# Patient Record
Sex: Male | Born: 1950 | Race: Black or African American | Hispanic: No | Marital: Married | Smoking: Never smoker
Health system: Southern US, Community
[De-identification: ages and names within clinical notes are randomized; demographics above are authoritative.]

## PROBLEM LIST (undated history)

## (undated) DIAGNOSIS — Z860100 Personal history of colon polyps, unspecified: Secondary | ICD-10-CM

## (undated) DIAGNOSIS — F32A Depression, unspecified: Secondary | ICD-10-CM

## (undated) DIAGNOSIS — N529 Male erectile dysfunction, unspecified: Secondary | ICD-10-CM

## (undated) DIAGNOSIS — F419 Anxiety disorder, unspecified: Secondary | ICD-10-CM

## (undated) DIAGNOSIS — K219 Gastro-esophageal reflux disease without esophagitis: Secondary | ICD-10-CM

## (undated) DIAGNOSIS — F329 Major depressive disorder, single episode, unspecified: Secondary | ICD-10-CM

## (undated) DIAGNOSIS — I10 Essential (primary) hypertension: Secondary | ICD-10-CM

## (undated) HISTORY — DX: Essential (primary) hypertension: I10

## (undated) HISTORY — DX: Personal history of colonic polyps: Z86.010

## (undated) HISTORY — DX: Depression, unspecified: F32.A

## (undated) HISTORY — DX: Major depressive disorder, single episode, unspecified: F32.9

## (undated) HISTORY — PX: TONSILECTOMY/ADENOIDECTOMY WITH MYRINGOTOMY: SHX6125

## (undated) HISTORY — DX: Anxiety disorder, unspecified: F41.9

## (undated) HISTORY — DX: Personal history of colon polyps, unspecified: Z86.0100

## (undated) HISTORY — DX: Male erectile dysfunction, unspecified: N52.9

---

## 1984-07-02 HISTORY — PX: VASECTOMY: SHX75

## 2005-08-21 ENCOUNTER — Encounter: Admission: RE | Admit: 2005-08-21 | Discharge: 2005-08-21 | Payer: Self-pay | Admitting: Internal Medicine

## 2008-04-03 ENCOUNTER — Emergency Department (HOSPITAL_COMMUNITY): Admission: EM | Admit: 2008-04-03 | Discharge: 2008-04-03 | Payer: Self-pay | Admitting: Emergency Medicine

## 2010-05-04 ENCOUNTER — Emergency Department (HOSPITAL_COMMUNITY): Admission: EM | Admit: 2010-05-04 | Discharge: 2010-05-04 | Payer: Self-pay | Admitting: Emergency Medicine

## 2013-11-26 ENCOUNTER — Encounter: Payer: Self-pay | Admitting: Internal Medicine

## 2014-03-19 ENCOUNTER — Encounter: Payer: Self-pay | Admitting: Internal Medicine

## 2016-10-27 LAB — HEPATIC FUNCTION PANEL
ALK PHOS: 56 (ref 25–125)
ALT: 13 (ref 10–40)
AST: 18 (ref 14–40)
BILIRUBIN, TOTAL: 0.8

## 2016-10-27 LAB — BASIC METABOLIC PANEL
BUN: 14 (ref 4–21)
Creatinine: 1.1 (ref 0.6–1.3)
GLUCOSE: 81
Potassium: 4.3 (ref 3.4–5.3)
SODIUM: 140 (ref 137–147)

## 2016-10-27 LAB — LIPID PANEL
Cholesterol: 155 (ref 0–200)
HDL: 61 (ref 35–70)
LDL CALC: 76
Triglycerides: 35 — AB (ref 40–160)

## 2016-10-27 LAB — CBC AND DIFFERENTIAL
HCT: 40 — AB (ref 41–53)
Hemoglobin: 13.6 (ref 13.5–17.5)
Neutrophils Absolute: 2
PLATELETS: 208 (ref 150–399)
WBC: 4.1

## 2016-10-27 LAB — TSH: TSH: 2.12 (ref 0.41–5.90)

## 2016-10-30 LAB — HM HEPATITIS C SCREENING LAB: HM HEPATITIS C SCREENING: NEGATIVE

## 2016-10-30 LAB — VITAMIN D 25 HYDROXY (VIT D DEFICIENCY, FRACTURES): VIT D 25 HYDROXY: 34

## 2016-10-30 LAB — PSA: PSA: 0.54

## 2017-12-16 ENCOUNTER — Ambulatory Visit (INDEPENDENT_AMBULATORY_CARE_PROVIDER_SITE_OTHER): Payer: BLUE CROSS/BLUE SHIELD | Admitting: Family Medicine

## 2017-12-16 ENCOUNTER — Encounter: Payer: Self-pay | Admitting: Family Medicine

## 2017-12-16 DIAGNOSIS — F39 Unspecified mood [affective] disorder: Secondary | ICD-10-CM | POA: Insufficient documentation

## 2017-12-16 DIAGNOSIS — I10 Essential (primary) hypertension: Secondary | ICD-10-CM | POA: Diagnosis not present

## 2017-12-16 MED ORDER — CITALOPRAM HYDROBROMIDE 20 MG PO TABS: 40.0000 mg | ORAL_TABLET | Freq: Every day | ORAL | 3 refills | Status: DC

## 2017-12-16 MED ORDER — TELMISARTAN 80 MG PO TABS
80.0000 mg | ORAL_TABLET | Freq: Every day | ORAL | 3 refills | Status: DC
Start: 2017-12-16 — End: 2018-09-01

## 2017-12-16 NOTE — Assessment & Plan Note (Signed)
At goal today on telmisartan 80 mg daily.  We will continue this.
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
## 2017-12-16 NOTE — Assessment & Plan Note (Signed)
Patient with elements of depression, anxiety and possibly PTSD.  His PHQ 9 score is 4 today.  His GAD score is 7.  We discussed treatment options.  We will start Celexa 20 mg daily for 1 to 2 weeks, then increase to 40 mg daily.  Discussed referral to behavioral medicine for counseling, patient will look into this.

## 2017-12-16 NOTE — Patient Instructions (Signed)
It was very nice to see you today!  I will refill your telmisartan today.  Your blood pressure looks great.  I think you have anxiety with depression.  You may also have PTSD.  Will start Celexa today.  Please take 1 pill daily for the next 1 to 2 weeks.  You can increase to 2 pills daily if you do well without side effects.  Please let me know if you be interested in seeing our behavioral specialist.  Come back to see me in 4 to 6 weeks, or sooner as needed.  Take care, Dr Jerline Pain

## 2018-01-01 ENCOUNTER — Encounter: Payer: Self-pay | Admitting: Family Medicine

## 2018-01-13 ENCOUNTER — Ambulatory Visit: Payer: BLUE CROSS/BLUE SHIELD | Admitting: Family Medicine

## 2018-08-12 ENCOUNTER — Telehealth: Payer: Self-pay | Admitting: Family Medicine

## 2018-08-12 NOTE — Telephone Encounter (Signed)
Need OK from both providers. Please advise.   Copied from [place] [phone]. Topic: Appointment Scheduling - Transfer of Care >> Aug 12, 2018 10:23 AM Reyne Dumas L wrote: Pt is requesting to transfer FROM: Dr. Jerline Pain Pt is requesting to transfer TO: Dr. Ethelene Hal Reason for requested transfer: office is closer to home  Send CRM to patient's current PCP (transferring FROM).  Pt is in need of a physical.  Pt can be reached at [phone].

## 2018-08-12 NOTE — Telephone Encounter (Signed)
Okay with me 

## 2018-08-12 NOTE — Telephone Encounter (Signed)
Okay to schedule patient for physical.

## 2018-08-12 NOTE — Telephone Encounter (Signed)
Ok with me 

## 2018-08-13 NOTE — Telephone Encounter (Signed)
I called and spoke to patient. Patient informed TOC was approved to see Dr. Ethelene Hal. Patient scheduled to see Dr. Ethelene Hal on 08/20/2018 @ 11am.

## 2018-08-20 ENCOUNTER — Encounter: Payer: BLUE CROSS/BLUE SHIELD | Admitting: Family Medicine

## 2018-09-01 ENCOUNTER — Encounter: Payer: Self-pay | Admitting: Family Medicine

## 2018-09-01 ENCOUNTER — Ambulatory Visit (INDEPENDENT_AMBULATORY_CARE_PROVIDER_SITE_OTHER): Payer: BLUE CROSS/BLUE SHIELD | Admitting: Family Medicine

## 2018-09-01 VITALS — BP 126/80 | HR 72 | Temp 97.3°F | Ht 73.0 in | Wt 168.0 lb

## 2018-09-01 DIAGNOSIS — Z0000 Encounter for general adult medical examination without abnormal findings: Secondary | ICD-10-CM | POA: Insufficient documentation

## 2018-09-01 DIAGNOSIS — I10 Essential (primary) hypertension: Secondary | ICD-10-CM

## 2018-09-01 DIAGNOSIS — F39 Unspecified mood [affective] disorder: Secondary | ICD-10-CM

## 2018-09-01 DIAGNOSIS — L720 Epidermal cyst: Secondary | ICD-10-CM

## 2018-09-01 DIAGNOSIS — Z125 Encounter for screening for malignant neoplasm of prostate: Secondary | ICD-10-CM | POA: Insufficient documentation

## 2018-09-01 DIAGNOSIS — Z0001 Encounter for general adult medical examination with abnormal findings: Secondary | ICD-10-CM | POA: Diagnosis not present

## 2018-09-01 LAB — COMPREHENSIVE METABOLIC PANEL
ALBUMIN: 4.2 g/dL (ref 3.5–5.2)
ALK PHOS: 65 U/L (ref 39–117)
ALT: 12 U/L (ref 0–53)
AST: 14 U/L (ref 0–37)
BUN: 12 mg/dL (ref 6–23)
CO2: 28 mEq/L (ref 19–32)
Calcium: 9.3 mg/dL (ref 8.4–10.5)
Chloride: 106 mEq/L (ref 96–112)
Creatinine, Ser: 1.1 mg/dL (ref 0.40–1.50)
GFR: 80.45 mL/min (ref >60.00)
GLUCOSE: 86 mg/dL (ref 70–99)
POTASSIUM: 4.2 meq/L (ref 3.5–5.1)
Sodium: 141 mEq/L (ref 135–145)
Total Bilirubin: 0.8 mg/dL (ref 0.2–1.2)
Total Protein: 7 g/dL (ref 6.0–8.3)

## 2018-09-01 LAB — URINALYSIS, ROUTINE W REFLEX MICROSCOPIC
Bilirubin Urine: NEGATIVE
Hgb urine dipstick: NEGATIVE
Ketones, ur: NEGATIVE
LEUKOCYTE UA: NEGATIVE
NITRITE: NEGATIVE
PH: 7 (ref 5.0–8.0)
SPECIFIC GRAVITY, URINE: 1.015 (ref 1.000–1.030)
Total Protein, Urine: NEGATIVE
Urine Glucose: NEGATIVE
Urobilinogen, UA: 0.2 (ref 0.0–1.0)

## 2018-09-01 LAB — LIPID PANEL
CHOLESTEROL: 125 mg/dL (ref 0–200)
HDL: 52.2 mg/dL (ref >39.00)
LDL Cholesterol: 63 mg/dL (ref 0–99)
NonHDL: 72.94
TRIGLYCERIDES: 51 mg/dL (ref 0.0–149.0)
Total CHOL/HDL Ratio: 2
VLDL: 10.2 mg/dL (ref 0.0–40.0)

## 2018-09-01 LAB — CBC
HCT: 38.6 % — ABNORMAL LOW (ref 39.0–52.0)
Hemoglobin: 13 g/dL (ref 13.0–17.0)
MCHC: 33.6 g/dL (ref 30.0–36.0)
MCV: 87.5 fl (ref 78.0–100.0)
Platelets: 224 10*3/uL (ref 150.0–400.0)
RBC: 4.41 Mil/uL (ref 4.22–5.81)
RDW: 14.1 % (ref 11.5–15.5)
WBC: 4.6 10*3/uL (ref 4.0–10.5)

## 2018-09-01 LAB — MICROALBUMIN / CREATININE URINE RATIO
CREATININE, U: 95.6 mg/dL
MICROALB/CREAT RATIO: 0.8 mg/g (ref 0.0–30.0)
Microalb, Ur: 0.7 mg/dL (ref 0.0–1.9)

## 2018-09-01 LAB — PSA: PSA: 0.49 ng/mL (ref 0.10–4.00)

## 2018-09-01 MED ORDER — TELMISARTAN 80 MG PO TABS: 80.0000 mg | ORAL_TABLET | Freq: Every day | ORAL | 3 refills | Status: DC

## 2018-09-01 MED ORDER — CITALOPRAM HYDROBROMIDE 20 MG PO TABS: 40.0000 mg | ORAL_TABLET | Freq: Every day | ORAL | 3 refills | Status: DC

## 2018-09-01 NOTE — Patient Instructions (Signed)
DASH Eating Plan DASH stands for "Dietary Approaches to Stop Hypertension." The DASH eating plan is a healthy eating plan that has been shown to reduce high blood pressure (hypertension). It may also reduce your risk for type 2 diabetes, heart disease, and stroke. The DASH eating plan may also help with weight loss. What are tips for following this plan?  General guidelines  Avoid eating more than 2,300 mg (milligrams) of salt (sodium) a day. If you have hypertension, you may need to reduce your sodium intake to 1,500 mg a day.  Limit alcohol intake to no more than 1 drink a day for nonpregnant women and 2 drinks a day for men. One drink equals 12 oz of beer, 5 oz of wine, or 1 oz of hard liquor.  Work with your health care provider to maintain a healthy body weight or to lose weight. Ask what an ideal weight is for you.  Get at least 30 minutes of exercise that causes your heart to beat faster (aerobic exercise) most days of the week. Activities may include walking, swimming, or biking.  Work with your health care provider or diet and nutrition specialist (dietitian) to adjust your eating plan to your individual calorie needs. Reading food labels   Check food labels for the amount of sodium per serving. Choose foods with less than 5 percent of the Daily Value of sodium. Generally, foods with less than 300 mg of sodium per serving fit into this eating plan.  To find whole grains, look for the word "whole" as the first word in the ingredient list. Shopping  Buy products labeled as "low-sodium" or "no salt added."  Buy fresh foods. Avoid canned foods and premade or frozen meals. Cooking  Avoid adding salt when cooking. Use salt-free seasonings or herbs instead of table salt or sea salt. Check with your health care provider or pharmacist before using salt substitutes.  Do not fry foods. Cook foods using healthy methods such as baking, boiling, grilling, and broiling instead.  Cook with  heart-healthy oils, such as olive, canola, soybean, or sunflower oil. Meal planning  Eat a balanced diet that includes: ? 5 or more servings of fruits and vegetables each day. At each meal, try to fill half of your plate with fruits and vegetables. ? Up to 6-8 servings of whole grains each day. ? Less than 6 oz of lean meat, poultry, or fish each day. A 3-oz serving of meat is about the same size as a deck of cards. One egg equals 1 oz. ? 2 servings of low-fat dairy each day. ? A serving of nuts, seeds, or beans 5 times each week. ? Heart-healthy fats. Healthy fats called Omega-3 fatty acids are found in foods such as flaxseeds and coldwater fish, like sardines, salmon, and mackerel.  Limit how much you eat of the following: ? Canned or prepackaged foods. ? Food that is high in trans fat, such as fried foods. ? Food that is high in saturated fat, such as fatty meat. ? Sweets, desserts, sugary drinks, and other foods with added sugar. ? Full-fat dairy products.  Do not salt foods before eating.  Try to eat at least 2 vegetarian meals each week.  Eat more home-cooked food and less restaurant, buffet, and fast food.  When eating at a restaurant, ask that your food be prepared with less salt or no salt, if possible. What foods are recommended? The items listed may not be a complete list. Talk with your dietitian about   what dietary choices are best for you. Grains Whole-grain or whole-wheat bread. Whole-grain or whole-wheat pasta. Brown rice. Oatmeal. Quinoa. Bulgur. Whole-grain and low-sodium cereals. Pita bread. Low-fat, low-sodium crackers. Whole-wheat flour tortillas. Vegetables Fresh or frozen vegetables (raw, steamed, roasted, or grilled). Low-sodium or reduced-sodium tomato and vegetable juice. Low-sodium or reduced-sodium tomato sauce and tomato paste. Low-sodium or reduced-sodium canned vegetables. Fruits All fresh, dried, or frozen fruit. Canned fruit in natural juice (without  added sugar). Meat and other protein foods Skinless chicken or turkey. Ground chicken or turkey. Pork with fat trimmed off. Fish and seafood. Egg whites. Dried beans, peas, or lentils. Unsalted nuts, nut butters, and seeds. Unsalted canned beans. Lean cuts of beef with fat trimmed off. Low-sodium, lean deli meat. Dairy Low-fat (1%) or fat-free (skim) milk. Fat-free, low-fat, or reduced-fat cheeses. Nonfat, low-sodium ricotta or cottage cheese. Low-fat or nonfat yogurt. Low-fat, low-sodium cheese. Fats and oils Soft margarine without trans fats. Vegetable oil. Low-fat, reduced-fat, or light mayonnaise and salad dressings (reduced-sodium). Canola, safflower, olive, soybean, and sunflower oils. Avocado. Seasoning and other foods Herbs. Spices. Seasoning mixes without salt. Unsalted popcorn and pretzels. Fat-free sweets. What foods are not recommended? The items listed may not be a complete list. Talk with your dietitian about what dietary choices are best for you. Grains Baked goods made with fat, such as croissants, muffins, or some breads. Dry pasta or rice meal packs. Vegetables Creamed or fried vegetables. Vegetables in a cheese sauce. Regular canned vegetables (not low-sodium or reduced-sodium). Regular canned tomato sauce and paste (not low-sodium or reduced-sodium). Regular tomato and vegetable juice (not low-sodium or reduced-sodium). Pickles. Olives. Fruits Canned fruit in a light or heavy syrup. Fried fruit. Fruit in cream or butter sauce. Meat and other protein foods Fatty cuts of meat. Ribs. Fried meat. Bacon. Sausage. Bologna and other processed lunch meats. Salami. Fatback. Hotdogs. Bratwurst. Salted nuts and seeds. Canned beans with added salt. Canned or smoked fish. Whole eggs or egg yolks. Chicken or turkey with skin. Dairy Whole or 2% milk, cream, and half-and-half. Whole or full-fat cream cheese. Whole-fat or sweetened yogurt. Full-fat cheese. Nondairy creamers. Whipped toppings.  Processed cheese and cheese spreads. Fats and oils Butter. Stick margarine. Lard. Shortening. Ghee. Bacon fat. Tropical oils, such as coconut, palm kernel, or palm oil. Seasoning and other foods Salted popcorn and pretzels. Onion salt, garlic salt, seasoned salt, table salt, and sea salt. Worcestershire sauce. Tartar sauce. Barbecue sauce. Teriyaki sauce. Soy sauce, including reduced-sodium. Steak sauce. Canned and packaged gravies. Fish sauce. Oyster sauce. Cocktail sauce. Horseradish that you find on the shelf. Ketchup. Mustard. Meat flavorings and tenderizers. Bouillon cubes. Hot sauce and Tabasco sauce. Premade or packaged marinades. Premade or packaged taco seasonings. Relishes. Regular salad dressings. Where to find more information:  National Heart, Lung, and Blood Institute: www.nhlbi.nih.gov  American Heart Association: www.heart.org Summary  The DASH eating plan is a healthy eating plan that has been shown to reduce high blood pressure (hypertension). It may also reduce your risk for type 2 diabetes, heart disease, and stroke.  With the DASH eating plan, you should limit salt (sodium) intake to 2,300 mg a day. If you have hypertension, you may need to reduce your sodium intake to 1,500 mg a day.  When on the DASH eating plan, aim to eat more fresh fruits and vegetables, whole grains, lean proteins, low-fat dairy, and heart-healthy fats.  Work with your health care provider or diet and nutrition specialist (dietitian) to adjust your eating plan to your   individual calorie needs. This information is not intended to replace advice given to you by your health care provider. Make sure you discuss any questions you have with your health care provider. Document Released: 06/07/2011 Document Revised: 06/11/2016 Document Reviewed: 06/11/2016 Elsevier Interactive Patient Education  2019 [place] Maintenance After Age 56 After age 58, you are at a higher risk for certain long-term  diseases and infections as well as injuries from falls. Falls are a major cause of broken bones and head injuries in people who are older than age 21. Getting regular preventive care can help to keep you healthy and well. Preventive care includes getting regular testing and making lifestyle changes as recommended by your health care provider. Talk with your health care provider about:  Which screenings and tests you should have. A screening is a test that checks for a disease when you have no symptoms.  A diet and exercise plan that is right for you. What should I know about screenings and tests to prevent falls? Screening and testing are the best ways to find a health problem early. Early diagnosis and treatment give you the best chance of managing medical conditions that are common after age 75. Certain conditions and lifestyle choices may make you more likely to have a fall. Your health care provider may recommend:  Regular vision checks. Poor vision and conditions such as cataracts can make you more likely to have a fall. If you wear glasses, make sure to get your prescription updated if your vision changes.  Medicine review. Work with your health care provider to regularly review all of the medicines you are taking, including over-the-counter medicines. Ask your health care provider about any side effects that may make you more likely to have a fall. Tell your health care provider if any medicines that you take make you feel dizzy or sleepy.  Osteoporosis screening. Osteoporosis is a condition that causes the bones to get weaker. This can make the bones weak and cause them to break more easily.  Blood pressure screening. Blood pressure changes and medicines to control blood pressure can make you feel dizzy.  Strength and balance checks. Your health care provider may recommend certain tests to check your strength and balance while standing, walking, or changing positions.  Foot health exam.  Foot pain and numbness, as well as not wearing proper footwear, can make you more likely to have a fall.  Depression screening. You may be more likely to have a fall if you have a fear of falling, feel emotionally low, or feel unable to do activities that you used to do.  Alcohol use screening. Using too much alcohol can affect your balance and may make you more likely to have a fall. What actions can I take to lower my risk of falls? General instructions  Talk with your health care provider about your risks for falling. Tell your health care provider if: ? You fall. Be sure to tell your health care provider about all falls, even ones that seem minor. ? You feel dizzy, sleepy, or off-balance.  Take over-the-counter and prescription medicines only as told by your health care provider. These include any supplements.  Eat a healthy diet and maintain a healthy weight. A healthy diet includes low-fat dairy products, low-fat (lean) meats, and fiber from whole grains, beans, and lots of fruits and vegetables. Home safety  Remove any tripping hazards, such as rugs, cords, and clutter.  Install safety equipment such as grab bars  in bathrooms and safety rails on stairs.  Keep rooms and walkways well-lit. Activity   Follow a regular exercise program to stay fit. This will help you maintain your balance. Ask your health care provider what types of exercise are appropriate for you.  If you need a cane or walker, use it as recommended by your health care provider.  Wear supportive shoes that have nonskid soles. Lifestyle  Do not drink alcohol if your health care provider tells you not to drink.  If you drink alcohol, limit how much you have: ? 0-1 drink a day for women. ? 0-2 drinks a day for men.  Be aware of how much alcohol is in your drink. In the [place] one drink equals one typical bottle of beer (12 oz), one-half glass of wine (5 oz), or one shot of hard liquor (1 oz).  Do not use any  products that contain nicotine or tobacco, such as cigarettes and e-cigarettes. If you need help quitting, ask your health care provider. Summary  Having a healthy lifestyle and getting preventive care can help to protect your health and wellness after age 70.  Screening and testing are the best way to find a health problem early and help you avoid having a fall. Early diagnosis and treatment give you the best chance for managing medical conditions that are more common for people who are older than age 40.  Falls are a major cause of broken bones and head injuries in people who are older than age 59. Take precautions to prevent a fall at home.  Work with your health care provider to learn what changes you can make to improve your health and wellness and to prevent falls. This information is not intended to replace advice given to you by your health care provider. Make sure you discuss any questions you have with your health care provider. Document Released: 05/01/2017 Document Revised: 05/01/2017 Document Reviewed: 05/01/2017 Elsevier Interactive Patient Education  2019 [place] DASH stands for "Dietary Approaches to Stop Hypertension." The DASH eating plan is a healthy eating plan that has been shown to reduce high blood pressure (hypertension). It may also reduce your risk for type 2 diabetes, heart disease, and stroke. The DASH eating plan may also help with weight loss. What are tips for following this plan?  General guidelines  Avoid eating more than 2,300 mg (milligrams) of salt (sodium) a day. If you have hypertension, you may need to reduce your sodium intake to 1,500 mg a day.  Limit alcohol intake to no more than 1 drink a day for nonpregnant women and 2 drinks a day for men. One drink equals 12 oz of beer, 5 oz of wine, or 1 oz of hard liquor.  Work with your health care provider to maintain a healthy body weight or to lose weight. Ask what an ideal weight  is for you.  Get at least 30 minutes of exercise that causes your heart to beat faster (aerobic exercise) most days of the week. Activities may include walking, swimming, or biking.  Work with your health care provider or diet and nutrition specialist (dietitian) to adjust your eating plan to your individual calorie needs. Reading food labels   Check food labels for the amount of sodium per serving. Choose foods with less than 5 percent of the Daily Value of sodium. Generally, foods with less than 300 mg of sodium per serving fit into this eating plan.  To find whole grains,  look for the word "whole" as the first word in the ingredient list. Shopping  Buy products labeled as "low-sodium" or "no salt added."  Buy fresh foods. Avoid canned foods and premade or frozen meals. Cooking  Avoid adding salt when cooking. Use salt-free seasonings or herbs instead of table salt or sea salt. Check with your health care provider or pharmacist before using salt substitutes.  Do not fry foods. Cook foods using healthy methods such as baking, boiling, grilling, and broiling instead.  Cook with heart-healthy oils, such as olive, canola, soybean, or sunflower oil. Meal planning  Eat a balanced diet that includes: ? 5 or more servings of fruits and vegetables each day. At each meal, try to fill half of your plate with fruits and vegetables. ? Up to 6-8 servings of whole grains each day. ? Less than 6 oz of lean meat, poultry, or fish each day. A 3-oz serving of meat is about the same size as a deck of cards. One egg equals 1 oz. ? 2 servings of low-fat dairy each day. ? A serving of nuts, seeds, or beans 5 times each week. ? Heart-healthy fats. Healthy fats called Omega-3 fatty acids are found in foods such as flaxseeds and coldwater fish, like sardines, salmon, and mackerel.  Limit how much you eat of the following: ? Canned or prepackaged foods. ? Food that is high in trans fat, such as fried  foods. ? Food that is high in saturated fat, such as fatty meat. ? Sweets, desserts, sugary drinks, and other foods with added sugar. ? Full-fat dairy products.  Do not salt foods before eating.  Try to eat at least 2 vegetarian meals each week.  Eat more home-cooked food and less restaurant, buffet, and fast food.  When eating at a restaurant, ask that your food be prepared with less salt or no salt, if possible. What foods are recommended? The items listed may not be a complete list. Talk with your dietitian about what dietary choices are best for you. Grains Whole-grain or whole-wheat bread. Whole-grain or whole-wheat pasta. Brown rice. [place]. Bulgur. Whole-grain and low-sodium cereals. Pita bread. Low-fat, low-sodium crackers. Whole-wheat flour tortillas. Vegetables Fresh or frozen vegetables (raw, steamed, roasted, or grilled). Low-sodium or reduced-sodium tomato and vegetable juice. Low-sodium or reduced-sodium tomato sauce and tomato paste. Low-sodium or reduced-sodium canned vegetables. Fruits All fresh, dried, or frozen fruit. Canned fruit in natural juice (without added sugar). Meat and other protein foods Skinless chicken or [place]. Ground chicken or [place]. Pork with fat trimmed off. Fish and seafood. Egg whites. Dried beans, peas, or lentils. Unsalted nuts, nut butters, and seeds. Unsalted canned beans. Lean cuts of beef with fat trimmed off. Low-sodium, lean deli meat. Dairy Low-fat (1%) or fat-free (skim) milk. Fat-free, low-fat, or reduced-fat cheeses. Nonfat, low-sodium ricotta or cottage cheese. Low-fat or nonfat yogurt. Low-fat, low-sodium cheese. Fats and oils Soft margarine without trans fats. Vegetable oil. Low-fat, reduced-fat, or light mayonnaise and salad dressings (reduced-sodium). Canola, safflower, olive, soybean, and sunflower oils. Avocado. Seasoning and other foods Herbs. Spices. Seasoning mixes without salt. Unsalted popcorn and pretzels. Fat-free  sweets. What foods are not recommended? The items listed may not be a complete list. Talk with your dietitian about what dietary choices are best for you. Grains Baked goods made with fat, such as croissants, muffins, or some breads. Dry pasta or rice meal packs. Vegetables Creamed or fried vegetables. Vegetables in a cheese sauce. Regular canned vegetables (not low-sodium or reduced-sodium). Regular  canned tomato sauce and paste (not low-sodium or reduced-sodium). Regular tomato and vegetable juice (not low-sodium or reduced-sodium). Angie Fava. Olives. Fruits Canned fruit in a light or heavy syrup. Fried fruit. Fruit in cream or butter sauce. Meat and other protein foods Fatty cuts of meat. Ribs. Fried meat. Berniece Salines. Sausage. Bologna and other processed lunch meats. Salami. Fatback. Hotdogs. Bratwurst. Salted nuts and seeds. Canned beans with added salt. Canned or smoked fish. Whole eggs or egg yolks. Chicken or [place] with skin. Dairy Whole or 2% milk, cream, and half-and-half. Whole or full-fat cream cheese. Whole-fat or sweetened yogurt. Full-fat cheese. Nondairy creamers. Whipped toppings. Processed cheese and cheese spreads. Fats and oils Butter. Stick margarine. Lard. Shortening. Ghee. Bacon fat. Tropical oils, such as coconut, palm kernel, or palm oil. Seasoning and other foods Salted popcorn and pretzels. Onion salt, garlic salt, seasoned salt, table salt, and sea salt. Worcestershire sauce. Tartar sauce. Barbecue sauce. Teriyaki sauce. Soy sauce, including reduced-sodium. Steak sauce. Canned and packaged gravies. Fish sauce. Oyster sauce. Cocktail sauce. Horseradish that you find on the shelf. Ketchup. Mustard. Meat flavorings and tenderizers. Bouillon cubes. Hot sauce and Tabasco sauce. Premade or packaged marinades. Premade or packaged taco seasonings. Relishes. Regular salad dressings. Where to find more information:  National Heart, Lung, and [place]:  https://wilson-eaton.com/  American Heart Association: www.heart.org Summary  The DASH eating plan is a healthy eating plan that has been shown to reduce high blood pressure (hypertension). It may also reduce your risk for type 2 diabetes, heart disease, and stroke.  With the DASH eating plan, you should limit salt (sodium) intake to 2,300 mg a day. If you have hypertension, you may need to reduce your sodium intake to 1,500 mg a day.  When on the DASH eating plan, aim to eat more fresh fruits and vegetables, whole grains, lean proteins, low-fat dairy, and heart-healthy fats.  Work with your health care provider or diet and nutrition specialist (dietitian) to adjust your eating plan to your individual calorie needs. This information is not intended to replace advice given to you by your health care provider. Make sure you discuss any questions you have with your health care provider. Document Released: 06/07/2011 Document Revised: 06/11/2016 Document Reviewed: 06/11/2016 Elsevier Interactive Patient Education  2019 [employer].

## 2018-09-01 NOTE — Progress Notes (Signed)
Established Patient Office Visit  Subjective:  Patient ID: Phillip Wall, male    DOB: 01-28-1951  Age: 68 y.o. MRN: [id number]  CC:  Chief Complaint  Patient presents with  . Establish Care  . Annual Exam    HPI Phillip Wall presents for establishment of care with a physical exam and follow-up of his hypertension and mood disorder.  Blood pressure is been well controlled on the Micardis and he denies any problems taking this medication.  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  Denies any issues taking medicine it is worked well for him for some time now.  He is fasting today.  He is retired.  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  He gets up to 13,000 steps daily.  He does not smoke drink alcohol or use illicit drugs.  He lives with his wife of 93 years.  His oldest daughter is 49 and is dealing with breast cancer rheumatoid arthritis.  Past Medical History:  Diagnosis Date  . Anxiety   . Depression   . ED (erectile dysfunction)   . History of colonic polyps   . Hypertension     Past Surgical History:  Procedure Laterality Date  . TONSILECTOMY/ADENOIDECTOMY WITH MYRINGOTOMY    . VASECTOMY  1986    Family History  Problem Relation Age of Onset  . Bladder Cancer Mother   . Heart disease Father   . Heart attack Father   . Breast cancer Paternal Grandmother   . Breast cancer Daughter   . Alcohol abuse Sister   . Hypertension Sister   . Cancer Sister   . Drug abuse Sister     Social History   Socioeconomic History  . Marital status: Married    Spouse name: Not on file  . Number of children: 3  . Years of education: Not on file  . Highest education level: Not on file  Occupational History  . Not on file  Social Needs  . Financial resource strain: Not on file  . Food insecurity:    Worry: Not on file    Inability: Not on file  .  Transportation needs:    Medical: Not on file    Non-medical: Not on file  Tobacco Use  . Smoking status: Never Smoker  . Smokeless tobacco: Never Used  Substance and Sexual Activity  . Alcohol use: Not Currently  . Drug use: Never  . Sexual activity: Yes  Lifestyle  . Physical activity:    Days per week: Not on file    Minutes per session: Not on file  . Stress: Not on file  Relationships  . Social connections:    Talks on phone: Not on file    Gets together: Not on file    Attends religious service: Not on file    Active member of club or organization: Not on file    Attends meetings of clubs or organizations: Not on file    Relationship status: Not on file  . Intimate partner violence:    Fear of current or ex partner: Not on file    Emotionally abused: Not on file    Physically abused: Not on file    Forced sexual activity: Not on file  Other Topics Concern  . Not on file  Social History Narrative  . Not on file  Outpatient Medications Prior to Visit  Medication Sig Dispense Refill  . citalopram (CELEXA) 20 MG tablet Take 2 tablets (40 mg total) by mouth daily. 60 tablet 3  . telmisartan (MICARDIS) 80 MG tablet Take 1 tablet (80 mg total) by mouth daily. 90 tablet 3   No facility-administered medications prior to visit.     No Known Allergies  ROS Review of Systems  Constitutional: Negative.   HENT: Negative.   Eyes: Negative for photophobia and visual disturbance.  Respiratory: Negative.   Cardiovascular: Negative.   Gastrointestinal: Negative.   Endocrine: Negative for polyphagia and polyuria.  Musculoskeletal: Negative.   Allergic/Immunologic: Negative for immunocompromised state.  Neurological: Negative for weakness and headaches.  Hematological: Does not bruise/bleed easily.  Psychiatric/Behavioral: Negative.       Objective:    Physical Exam  Constitutional: He is oriented to person, place, and time. He appears well-developed and  well-nourished. No distress.  HENT:  Head: Normocephalic and atraumatic.  Right Ear: External ear normal.  Left Ear: External ear normal.  Mouth/Throat: Oropharynx is clear and moist. No oropharyngeal exudate.  Eyes: Pupils are equal, round, and reactive to light. Conjunctivae are normal. Right eye exhibits no discharge. Left eye exhibits no discharge. No scleral icterus.  Neck: Neck supple. No JVD present. No tracheal deviation present. No thyromegaly present.  Cardiovascular: Normal rate, regular rhythm and normal heart sounds.  Pulmonary/Chest: Effort normal and breath sounds normal. No stridor.  Abdominal: Soft. Bowel sounds are normal. He exhibits no distension. There is no abdominal tenderness. There is no rebound and no guarding. Hernia confirmed negative in the right inguinal area and confirmed negative in the left inguinal area.  Genitourinary:    Penis normal.  Rectum:     Guaiac result negative.     No rectal mass, anal fissure, tenderness, external hemorrhoid, internal hemorrhoid or abnormal anal tone.  Prostate is not enlarged and not tender. Right testis shows no mass, no swelling and no tenderness. Right testis is descended. Left testis shows no mass, no swelling and no tenderness. Left testis is descended. No hypospadias, penile erythema or penile tenderness. No discharge found.  Musculoskeletal:        General: No edema.  Lymphadenopathy:    He has no cervical adenopathy.       Right: No inguinal adenopathy present.  Neurological: He is alert and oriented to person, place, and time.  Skin: Skin is warm and dry. He is not diaphoretic.       BP 126/80   Pulse 72   Temp (!) 97.3 F (36.3 C) (Oral)   Ht 6\' 1"  (1.854 m)   Wt 168 lb (76.2 kg)   SpO2 97%   BMI 22.16 kg/m  Wt Readings from Last 3 Encounters:  09/01/18 168 lb (76.2 kg)  12/16/17 163 lb (73.9 kg)   BP Readings from Last 3 Encounters:  09/01/18 126/80  12/16/17 134/84   Guideline developer:  UpToDate  (see UpToDate for funding source) Date Released: June 2014  Health Maintenance Due  Topic Date Due  . PNA vac Low Risk Adult (1 of 2 - PCV13) 03/31/2015    There are no preventive care reminders to display for this patient.  Lab Results  Component Value Date   TSH 2.12 10/27/2016   Lab Results  Component Value Date   WBC 4.1 10/27/2016   HGB 13.6 10/27/2016   HCT 40 (A) 10/27/2016   PLT 208 10/27/2016   Lab Results  Component Value Date  NA 140 10/27/2016   K 4.3 10/27/2016   BUN 14 10/27/2016   CREATININE 1.1 10/27/2016   ALKPHOS 56 10/27/2016   AST 18 10/27/2016   ALT 13 10/27/2016   Lab Results  Component Value Date   CHOL 155 10/27/2016   Lab Results  Component Value Date   HDL 61 10/27/2016   Lab Results  Component Value Date   LDLCALC 76 10/27/2016   Lab Results  Component Value Date   TRIG 35 (A) 10/27/2016   No results found for: CHOLHDL No results found for: HGBA1C    Assessment & Plan:   Problem List Items Addressed This Visit      Cardiovascular and Mediastinum   Hypertension - Primary   Relevant Medications   telmisartan (MICARDIS) 80 MG tablet   Other Relevant Orders   CBC   Comprehensive metabolic panel   Urinalysis, Routine w reflex microscopic   Microalbumin / creatinine urine ratio     Other   Mood disorder (HCC)   Relevant Medications   citalopram (CELEXA) 20 MG tablet   Inclusion cyst   Relevant Orders   Ambulatory referral to Plastic Surgery   Encounter for health maintenance examination with abnormal findings   Relevant Orders   Lipid panel   PSA      Meds ordered this encounter  Medications  . citalopram (CELEXA) 20 MG tablet    Sig: Take 2 tablets (40 mg total) by mouth daily.    Dispense:  60 tablet    Refill:  3  . telmisartan (MICARDIS) 80 MG tablet    Sig: Take 1 tablet (80 mg total) by mouth daily.    Dispense:  90 tablet    Refill:  3    Follow-up: Return in about 6 months (around 03/04/2019).    Patient was given information on the DASH diet and health maintenance.  Follow-up will be in 6 months.

## 2018-12-17 ENCOUNTER — Other Ambulatory Visit: Payer: Self-pay | Admitting: Family Medicine

## 2018-12-17 DIAGNOSIS — I10 Essential (primary) hypertension: Secondary | ICD-10-CM

## 2019-01-02 ENCOUNTER — Other Ambulatory Visit: Payer: Self-pay | Admitting: Family Medicine

## 2019-01-02 DIAGNOSIS — I10 Essential (primary) hypertension: Secondary | ICD-10-CM

## 2019-01-02 NOTE — Telephone Encounter (Signed)
Pt saw dr Ethelene Hal in march 2020 and a new pt and need new rx from dr Ethelene Hal telmisartan 80 mg #90 w/[facility]

## 2019-01-03 MED ORDER — TELMISARTAN 80 MG PO TABS: 80.0000 mg | ORAL_TABLET | Freq: Every day | ORAL | 3 refills | Status: DC

## 2019-01-03 NOTE — Telephone Encounter (Signed)
Rx sent in

## 2019-01-06 ENCOUNTER — Telehealth: Payer: Self-pay | Admitting: Family Medicine

## 2019-01-06 NOTE — Telephone Encounter (Signed)
Called on behalf of Dr Ethelene Hal, based on last appt Dr Ethelene Hal wanted pt to return in 6 months for a follow up which be around 03/04/19 and I wanted to see if he wanted to go ahead and schedule. Left message

## 2019-03-05 ENCOUNTER — Ambulatory Visit: Payer: BLUE CROSS/BLUE SHIELD | Admitting: Family Medicine

## 2019-03-12 ENCOUNTER — Other Ambulatory Visit: Payer: Self-pay

## 2019-03-12 ENCOUNTER — Encounter: Payer: Self-pay | Admitting: Family Medicine

## 2019-03-12 ENCOUNTER — Ambulatory Visit (INDEPENDENT_AMBULATORY_CARE_PROVIDER_SITE_OTHER): Payer: Medicare Other | Admitting: Family Medicine

## 2019-03-12 VITALS — BP 120/70 | HR 64 | Ht 73.0 in | Wt 165.0 lb

## 2019-03-12 DIAGNOSIS — N5201 Erectile dysfunction due to arterial insufficiency: Secondary | ICD-10-CM

## 2019-03-12 DIAGNOSIS — Z23 Encounter for immunization: Secondary | ICD-10-CM | POA: Diagnosis not present

## 2019-03-12 DIAGNOSIS — K219 Gastro-esophageal reflux disease without esophagitis: Secondary | ICD-10-CM | POA: Diagnosis not present

## 2019-03-12 DIAGNOSIS — I10 Essential (primary) hypertension: Secondary | ICD-10-CM

## 2019-03-12 DIAGNOSIS — F39 Unspecified mood [affective] disorder: Secondary | ICD-10-CM | POA: Diagnosis not present

## 2019-03-12 LAB — BASIC METABOLIC PANEL
BUN: 15 mg/dL (ref 6–23)
CO2: 29 mEq/L (ref 19–32)
Calcium: 9.2 mg/dL (ref 8.4–10.5)
Chloride: 105 mEq/L (ref 96–112)
Creatinine, Ser: 1.16 mg/dL (ref 0.40–1.50)
GFR: 75.55 mL/min (ref >60.00)
Glucose, Bld: 89 mg/dL (ref 70–99)
Potassium: 4 mEq/L (ref 3.5–5.1)
Sodium: 139 mEq/L (ref 135–145)

## 2019-03-12 MED ORDER — CITALOPRAM HYDROBROMIDE 20 MG PO TABS: 40.0000 mg | ORAL_TABLET | Freq: Every day | ORAL | 2 refills | Status: DC

## 2019-03-12 MED ORDER — FAMOTIDINE 40 MG PO TABS: ORAL_TABLET | ORAL | 1 refills | Status: DC

## 2019-03-12 MED ORDER — SILDENAFIL CITRATE 20 MG PO TABS: ORAL_TABLET | ORAL | 1 refills | Status: DC

## 2019-03-12 MED ORDER — TELMISARTAN 80 MG PO TABS: 80.0000 mg | ORAL_TABLET | Freq: Every day | ORAL | 3 refills | Status: DC

## 2019-03-12 NOTE — Progress Notes (Addendum)
Established Patient Office Visit  Subjective:  Patient ID: Phillip Wall, male    DOB: 09-21-1950  Age: 68 y.o. MRN: [id number]  CC:  Chief Complaint  Patient presents with  . Follow-up    HPI Phillip Wall presents for follow-up of his hypertension anxiety and depression.  Continues to take Micardis and Celexa for control of these issues.  Fortunately they both continue to work well for him.  With these COVID times he admits to some anxiety but still believes it is well controlled.  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  He has been having some problems with indigestion.  It is relieved with Pepto-Bismol and homeopathic antiacids.  He also has noted some dryness in his throat when he is speaking at his church.  He feels no postnasal drip.  He denies any changes in his voice.  He was unable to see the plastic surgeon for the cyst on his forehead but would like to continue to watch it for now.  Past Medical History:  Diagnosis Date  . Anxiety   . Depression   . ED (erectile dysfunction)   . History of colonic polyps   . Hypertension     Past Surgical History:  Procedure Laterality Date  . TONSILECTOMY/ADENOIDECTOMY WITH MYRINGOTOMY    . VASECTOMY  1986    Family History  Problem Relation Age of Onset  . Bladder Cancer Mother   . Heart disease Father   . Heart attack Father   . Breast cancer Paternal Grandmother   . Breast cancer Daughter   . Alcohol abuse Sister   . Hypertension Sister   . Cancer Sister   . Drug abuse Sister     Social History   Socioeconomic History  . Marital status: Married    Spouse name: Not on file  . Number of children: 3  . Years of education: Not on file  . Highest education level: Not on file  Occupational History  . Not on file  Social Needs  . Financial resource strain: Not on file  . Food insecurity    Worry: Not on file    Inability: Not on file  . Transportation needs    Medical: Not  on file    Non-medical: Not on file  Tobacco Use  . Smoking status: Never Smoker  . Smokeless tobacco: Never Used  Substance and Sexual Activity  . Alcohol use: Not Currently  . Drug use: Never  . Sexual activity: Yes  Lifestyle  . Physical activity    Days per week: Not on file    Minutes per session: Not on file  . Stress: Not on file  Relationships  . Social Herbalist on phone: Not on file    Gets together: Not on file    Attends religious service: Not on file    Active member of club or organization: Not on file    Attends meetings of clubs or organizations: Not on file    Relationship status: Not on file  . Intimate partner violence    Fear of current or ex partner: Not on file    Emotionally abused: Not on file    Physically abused: Not on file    Forced sexual activity: Not on file  Other Topics Concern  . Not on file  Social History Narrative  . Not on file    Outpatient Medications Prior to Visit  Medication  Sig Dispense Refill  . citalopram (CELEXA) 20 MG tablet Take 2 tablets (40 mg total) by mouth daily. 60 tablet 3  . telmisartan (MICARDIS) 80 MG tablet Take 1 tablet (80 mg total) by mouth daily. 90 tablet 3   No facility-administered medications prior to visit.     No Known Allergies  ROS Review of Systems  Constitutional: Negative.   HENT: Negative.   Eyes: Negative for photophobia and visual disturbance.  Respiratory: Negative.   Cardiovascular: Negative.   Gastrointestinal: Negative.   Endocrine: Negative for polyphagia and polyuria.  Genitourinary: Negative.   Musculoskeletal: Negative for gait problem and joint swelling.  Skin: Negative for pallor and rash.  Allergic/Immunologic: Negative for immunocompromised state.  Neurological: Negative for light-headedness and headaches.  Hematological: Does not bruise/bleed easily.   Depression screen [facility] 2/9 03/12/2019 09/01/2018 12/16/2017  Decreased Interest 0 1 1  Down, Depressed,  Hopeless 0 1 1  PHQ - 2 Score 0 2 2  Altered sleeping 1 0 0  Tired, decreased energy 0 0 0  Change in appetite 0 1 1  Feeling bad or failure about yourself  0 1 1  Trouble concentrating 0 1 0  Moving slowly or fidgety/restless 0 0 0  Suicidal thoughts 0 1 0  PHQ-9 Score 1 6 4   Difficult doing work/chores - - Not difficult at all      Objective:    Physical Exam  Constitutional: He is oriented to person, place, and time. He appears well-developed and well-nourished. No distress.  HENT:  Head: Normocephalic and atraumatic.  Right Ear: External ear normal.  Left Ear: External ear normal.  Mouth/Throat: Oropharynx is clear and moist. No oropharyngeal exudate.  Eyes: Pupils are equal, round, and reactive to light. Conjunctivae are normal. Right eye exhibits no discharge. Left eye exhibits no discharge. No scleral icterus.  Neck: Neck supple. No JVD present. No tracheal deviation present. No thyromegaly present.  Cardiovascular: Normal rate, regular rhythm and normal heart sounds.  Pulmonary/Chest: Effort normal and breath sounds normal. No stridor.  Abdominal: Bowel sounds are normal.  Musculoskeletal:        General: No edema.  Lymphadenopathy:    He has no cervical adenopathy.  Neurological: He is oriented to person, place, and time.  Skin: Skin is warm and dry. He is not diaphoretic.  Psychiatric: He has a normal mood and affect. His behavior is normal.    BP 120/70   Pulse 64   Ht 6\' 1"  (1.854 m)   Wt 165 lb (74.8 kg)   SpO2 92%   BMI 21.77 kg/m  Wt Readings from Last 3 Encounters:  03/12/19 165 lb (74.8 kg)  09/01/18 168 lb (76.2 kg)  12/16/17 163 lb (73.9 kg)   BP Readings from Last 3 Encounters:  03/12/19 120/70  09/01/18 126/80  12/16/17 134/84   Guideline developer:  UpToDate (see UpToDate for funding source) Date Released: June 2014  Health Maintenance Due  Topic Date Due  . PNA vac Low Risk Adult (1 of 2 - PCV13) 03/31/2015    There are no  preventive care reminders to display for this patient.  Lab Results  Component Value Date   TSH 2.12 10/27/2016   Lab Results  Component Value Date   WBC 4.6 09/01/2018   HGB 13.0 09/01/2018   HCT 38.6 (L) 09/01/2018   MCV 87.5 09/01/2018   PLT 224.0 09/01/2018   Lab Results  Component Value Date   NA 141 09/01/2018   K 4.2  09/01/2018   CO2 28 09/01/2018   GLUCOSE 86 09/01/2018   BUN 12 09/01/2018   CREATININE 1.10 09/01/2018   BILITOT 0.8 09/01/2018   ALKPHOS 65 09/01/2018   AST 14 09/01/2018   ALT 12 09/01/2018   PROT 7.0 09/01/2018   ALBUMIN 4.2 09/01/2018   CALCIUM 9.3 09/01/2018   GFR 80.45 09/01/2018   Lab Results  Component Value Date   CHOL 125 09/01/2018   Lab Results  Component Value Date   HDL 52.20 09/01/2018   Lab Results  Component Value Date   LDLCALC 63 09/01/2018   Lab Results  Component Value Date   TRIG 51.0 09/01/2018   Lab Results  Component Value Date   CHOLHDL 2 09/01/2018   No results found for: HGBA1C    Assessment & Plan:   Problem List Items Addressed This Visit      Cardiovascular and Mediastinum   Essential hypertension   Relevant Medications   sildenafil (REVATIO) 20 MG tablet   telmisartan (MICARDIS) 80 MG tablet   Other Relevant Orders   Basic metabolic panel   Erectile dysfunction due to arterial insufficiency   Relevant Medications   sildenafil (REVATIO) 20 MG tablet   telmisartan (MICARDIS) 80 MG tablet     Digestive   Gastroesophageal reflux disease   Relevant Medications   famotidine (PEPCID) 40 MG tablet     Other   Mood disorder (HCC)   Relevant Medications   citalopram (CELEXA) 20 MG tablet   Need for influenza vaccination - Primary   Relevant Orders   Flu Vaccine QUAD High Dose(Fluad) (Completed)      Meds ordered this encounter  Medications  . sildenafil (REVATIO) 20 MG tablet    Sig: Take 1-3 tablets 45 minutes prior...  No more than 5 pills daily    Dispense:  50 tablet    Refill:  1   . famotidine (PEPCID) 40 MG tablet    Sig: Take one daily as needed for indigestion    Dispense:  90 tablet    Refill:  1  . citalopram (CELEXA) 20 MG tablet    Sig: Take 2 tablets (40 mg total) by mouth daily.    Dispense:  180 tablet    Refill:  2  . telmisartan (MICARDIS) 80 MG tablet    Sig: Take 1 tablet (80 mg total) by mouth daily.    Dispense:  90 tablet    Refill:  3    Follow-up: Return in about 6 months (around 09/09/2019), or Return to see me if not improving in 2 to 3 months.  Otherwise in 6 months..    Patient was given information on indigestion.

## 2019-03-12 NOTE — Patient Instructions (Signed)
Heartburn Heartburn is a type of pain or discomfort that can happen in the throat or chest. It is often described as a burning pain. It may also cause a bad, acid-like taste in the mouth. Heartburn may feel worse when you lie down or bend over. It may be worse at night. It may be caused by stomach contents that move back up (reflux) into the tube that connects the mouth with the stomach (esophagus). Follow these instructions at home: Eating and drinking   Avoid certain foods and drinks as told by your doctor. This may include: ? Coffee and tea (with or without caffeine). ? Drinks that have alcohol. ? Energy drinks and sports drinks. ? Carbonated drinks or sodas. ? Chocolate and cocoa. ? Peppermint and mint flavorings. ? Garlic and onions. ? Horseradish. ? Spicy and acidic foods, such as:  Peppers.  Chili powder and curry powder.  Vinegar.  Hot sauces and BBQ sauce. ? Citrus fruit juices and citrus fruits, such as:  Oranges.  Lemons.  Limes. ? Tomato-based foods, such as:  Red sauce and pizza with red sauce.  Chili.  Salsa. ? Fried and fatty foods, such as:  Donuts.  French fries and potato chips.  High-fat dressings. ? High-fat meats, such as:  Hot dogs and sausage.  Rib eye steak.  Ham and bacon. ? High-fat dairy items, such as:  Whole milk.  Butter.  Cream cheese.  Eat small meals often. Avoid eating large meals.  Avoid drinking large amounts of liquid with your meals.  Avoid eating meals during the 2-3 hours before bedtime.  Avoid lying down right after you eat.  Do not exercise right after you eat. Lifestyle      If you are overweight, lose an amount of weight that is healthy for you. Ask your doctor about a safe weight loss goal.  Do not use any products that contain nicotine or tobacco, including cigarettes, e-cigarettes, and chewing tobacco. These can make your symptoms worse. If you need help quitting, ask your doctor.  Wear loose  clothes. Do not wear anything tight around your waist.  Raise (elevate) the head of your bed about 6 inches (15 cm) when you sleep.  Try to lower your stress. If you need help doing this, ask your doctor. General instructions  Pay attention to any changes in your symptoms.  Take over-the-counter and prescription medicines only as told by your doctor. ? Do not take aspirin, ibuprofen, or other NSAIDs unless your doctor says it is okay. ? Stop medicines only as told by your doctor.  Keep all follow-up visits as told by your doctor. This is important. Contact a doctor if:  You have new symptoms.  You lose weight and you do not know why it is happening.  You have trouble swallowing, or it hurts to swallow.  You have wheezing or a cough that keeps happening.  Your symptoms do not get better with treatment.  You have heartburn often for more than 2 weeks. Get help right away if:  You have pain in your arms, neck, jaw, teeth, or back.  You feel sweaty, dizzy, or light-headed.  You have chest pain or shortness of breath.  You throw up (vomit) and your throw up looks like blood or coffee grounds.  Your poop (stool) is bloody or black. These symptoms may represent a serious problem that is an emergency. Do not wait to see if the symptoms will go away. Get medical help right away. Call your local   emergency services (911 in the [place] Do not drive yourself to the hospital. Summary  Heartburn is a type of pain that can happen in the throat or chest. It can feel like a burning pain. It may also cause a bad, acid-like taste in the mouth.  You may need to avoid certain foods and drinks to help your symptoms. Ask your doctor what foods and drinks you should avoid.  Take over-the-counter and prescription medicines only as told by your doctor. Do not take aspirin, ibuprofen, or other NSAIDs unless your doctor told you to do so.  Contact your doctor if your symptoms do not get better or  they get worse. This information is not intended to replace advice given to you by your health care provider. Make sure you discuss any questions you have with your health care provider. Document Released: 02/28/2011 Document Revised: 11/18/2017 Document Reviewed: 11/18/2017 Elsevier Patient Education  2020 [place] is a feeling of pain, discomfort, burning, or fullness in the upper part of your belly (abdomen). It can come and go. It may occur often or rarely. Indigestion tends to happen while you are eating or right after you have finished eating. Indigestion may be a symptom of another condition. It may be worse:  At night.  When bending over.  While lying down. Follow these instructions at home: Eating and drinking   Follow an eating plan as told by your doctor.  You may need to avoid foods and drinks such as: ? Chocolate and cocoa. ? Peppermint and mint flavorings. ? Garlic and onions. ? Horseradish. ? Spicy and acidic foods, such as:  Peppers.  Chili powder and curry powder.  Vinegar.  Hot sauces and BBQ sauce. ? Citrus fruits, such as:  Oranges.  Lemons.  Limes. ? Tomato-based foods, such as:  Red sauce and pizza with red sauce.  Chili.  Salsa. ? Fried and fatty foods, such as:  Donuts.  [place] fries and potato chips.  High-fat dressings. ? High-fat meats, such as:  Hot dogs and sausage.  Rib eye steak.  Ham and bacon. ? High-fat dairy items, such as:  Whole milk.  Butter.  Cream cheese. ? Coffee and tea (with or without caffeine). ? Drinks that contain alcohol. ? Energy drinks and sports drinks. ? Carbonated drinks or sodas. ? Citrus fruit juices.  Eat small meals often. Avoid eating large meals.  Avoid drinking large amounts of liquid with your meals.  Avoid eating meals during the 2-3 hours before bedtime.  Avoid lying down right after you eat.  Avoid exercise for 2 hours after you eat.  Lifestyle      Maintain a healthy weight. Ask your doctor what weight is healthy for you. If you need to lose weight, work with your doctor.  Exercise for at least 30 minutes on 5 or more days each week, or as told by your doctor. ? Avoid exercises that include bending forward. This can make your symptoms worse.  Wear loose clothes. Do not wear anything tight around your waist.  Do not use any products that contain nicotine or tobacco, including cigarettes, e-cigarettes, and chewing tobacco. These can make your symptoms worse. If you need help quitting, ask your doctor.  Raise (elevate) the head of your bed about 6 inches (15 cm) when you sleep.  Try to lower your stress. If you need help doing this, ask your doctor. General instructions  Take over-the-counter and prescription medicines only as told by your doctor. ?  Do not take aspirin, ibuprofen, or other NSAIDs unless your doctor says it is okay.  Pay attention to any changes in your symptoms.  Keep all follow-up visits as told by your doctor. This is important. Contact a doctor if:  You have new symptoms.  You lose weight and you do not know why it is happening.  You have trouble swallowing, or it hurts to swallow.  Your symptoms do not get better with treatment.  Your symptoms last for more than 2 days.  You have a fever.  You throw up (vomit). Get help right away if:  You have pain in your arms, neck, jaw, teeth, or back.  You feel sweaty, dizzy, or light-headed.  You pass out (faint).  You have chest pain or shortness of breath.  You cannot stop throwing up, or you throw up blood.  Your poop (stool) is bloody or black.  You have very bad pain in your belly. These symptoms may represent a serious problem that is an emergency. Do not wait to see if the symptoms will go away. Get medical help right away. Call your local emergency services (911 in the [place] Do not drive yourself to the hospital. Summary   Indigestion is a feeling of pain, discomfort, burning, or fullness in the upper part of your belly. It tends to happen while you are eating or right after you have finished eating.  Follow an eating plan and other lifestyle changes as told by your doctor.  Take over-the-counter and prescription medicines only as told by your doctor. Do not take aspirin, ibuprofen, or other NSAIDs unless your doctor says it is okay.  Contact your doctor if your symptoms do not get better or they get worse.  Some symptoms may represent a serious problem that is an emergency. Do not wait to see if the symptoms will go away. Get medical help right away. This information is not intended to replace advice given to you by your health care provider. Make sure you discuss any questions you have with your health care provider. Document Released: 07/21/2010 Document Revised: 11/18/2017 Document Reviewed: 11/18/2017 Elsevier Patient Education  2020 [employer].

## 2019-03-19 ENCOUNTER — Encounter: Payer: Self-pay | Admitting: Family Medicine

## 2019-03-19 DIAGNOSIS — N5201 Erectile dysfunction due to arterial insufficiency: Secondary | ICD-10-CM

## 2019-03-20 MED ORDER — SILDENAFIL CITRATE 20 MG PO TABS: ORAL_TABLET | ORAL | 1 refills | Status: DC

## 2019-06-18 ENCOUNTER — Ambulatory Visit: Payer: Medicare Other | Attending: Internal Medicine

## 2019-06-18 DIAGNOSIS — T7632XA Child psychological abuse, suspected, initial encounter: Secondary | ICD-10-CM

## 2019-06-19 LAB — NOVEL CORONAVIRUS, NAA: SARS-CoV-2, NAA: DETECTED — AB

## 2019-06-20 ENCOUNTER — Other Ambulatory Visit: Payer: Self-pay | Admitting: Unknown Physician Specialty

## 2019-06-20 ENCOUNTER — Telehealth: Payer: Self-pay | Admitting: Unknown Physician Specialty

## 2019-06-20 DIAGNOSIS — U071 COVID-19: Secondary | ICD-10-CM

## 2019-06-20 NOTE — Telephone Encounter (Signed)
  I connected by phone with Phillip Wall on 06/20/2019 at 11:24 AM to discuss the potential use of an new treatment for mild to moderate COVID-19 viral infection in non-hospitalized patients.  This patient is a 68 y.o. male that meets the FDA criteria for Emergency Use Authorization of bamlanivimab or casirivimab\imdevimab.  Has a (+) direct SARS-CoV-2 viral test result  Has mild or moderate COVID-19   Is ? 68 years of age and weighs ? 40 kg  Is NOT hospitalized due to COVID-19  Is NOT requiring oxygen therapy or requiring an increase in baseline oxygen flow rate due to COVID-19  Is within 10 days of symptom onset  Has at least one of the high risk factor(s) for progression to severe COVID-19 and/or hospitalization as defined in EUA.  Specific high risk criteria : >/= 69 yo   I have spoken and communicated the following to the patient or parent/caregiver:  1. FDA has authorized the emergency use of bamlanivimab and casirivimab\imdevimab for the treatment of mild to moderate COVID-19 in adults and pediatric patients with positive results of direct SARS-CoV-2 viral testing who are 71 years of age and older weighing at least 40 kg, and who are at high risk for progressing to severe COVID-19 and/or hospitalization.  2. The significant known and potential risks and benefits of bamlanivimab and casirivimab\imdevimab, and the extent to which such potential risks and benefits are unknown.  3. Information on available alternative treatments and the risks and benefits of those alternatives, including clinical trials.  4. Patients treated with bamlanivimab and casirivimab\imdevimab should continue to self-isolate and use infection control measures (e.g., wear mask, isolate, social distance, avoid sharing personal items, clean and disinfect "high touch" surfaces, and frequent handwashing) according to CDC guidelines.   5. The patient or parent/caregiver has the option to accept or refuse  bamlanivimab or casirivimab\imdevimab .  After reviewing this information with the patient, The patient agreed to proceed with receiving the bamlanimivab infusion and will be provided a copy of the Fact sheet prior to receiving the infusion.[name] 06/20/2019 11:24 AM

## 2019-06-20 NOTE — Progress Notes (Signed)
  I connected by phone with Phillip Wall on 06/20/2019 at 11:28 AM to discuss the potential use of an new treatment for mild to moderate COVID-19 viral infection in non-hospitalized patients.  This patient is a 68 y.o. male that meets the FDA criteria for Emergency Use Authorization of bamlanivimab or casirivimab\imdevimab.  Has a (+) direct SARS-CoV-2 viral test result  Has mild or moderate COVID-19   Is ? 68 years of age and weighs ? 40 kg  Is NOT hospitalized due to COVID-19  Is NOT requiring oxygen therapy or requiring an increase in baseline oxygen flow rate due to COVID-19  Is within 10 days of symptom onset  Has at least one of the high risk factor(s) for progression to severe COVID-19 and/or hospitalization as defined in EUA.  Specific high risk criteria : >/= 69 yo   I have spoken and communicated the following to the patient or parent/caregiver:  1. FDA has authorized the emergency use of bamlanivimab and casirivimab\imdevimab for the treatment of mild to moderate COVID-19 in adults and pediatric patients with positive results of direct SARS-CoV-2 viral testing who are 51 years of age and older weighing at least 40 kg, and who are at high risk for progressing to severe COVID-19 and/or hospitalization.  2. The significant known and potential risks and benefits of bamlanivimab and casirivimab\imdevimab, and the extent to which such potential risks and benefits are unknown.  3. Information on available alternative treatments and the risks and benefits of those alternatives, including clinical trials.  4. Patients treated with bamlanivimab and casirivimab\imdevimab should continue to self-isolate and use infection control measures (e.g., wear mask, isolate, social distance, avoid sharing personal items, clean and disinfect "high touch" surfaces, and frequent handwashing) according to CDC guidelines.   5. The patient or parent/caregiver has the option to accept or refuse  bamlanivimab or casirivimab\imdevimab .  After reviewing this information with the patient, The patient agreed to proceed with receiving the bamlanimivab infusion and will be provided a copy of the Fact sheet prior to receiving the infusion.[name] 06/20/2019 11:28 AM

## 2019-06-22 ENCOUNTER — Encounter (HOSPITAL_COMMUNITY): Payer: Self-pay

## 2019-06-22 ENCOUNTER — Ambulatory Visit (HOSPITAL_COMMUNITY)
Admission: RE | Admit: 2019-06-22 | Discharge: 2019-06-22 | Disposition: A | Discharge status: Home / Self Care | Payer: Medicare Other | Source: Ambulatory Visit | Attending: Pulmonary Disease | Admitting: Pulmonary Disease

## 2019-06-22 DIAGNOSIS — Z23 Encounter for immunization: Secondary | ICD-10-CM | POA: Insufficient documentation

## 2019-06-22 DIAGNOSIS — U071 COVID-19: Secondary | ICD-10-CM | POA: Diagnosis not present

## 2019-06-22 MED ORDER — ALBUTEROL SULFATE HFA 108 (90 BASE) MCG/ACT IN AERS: 2.0000 | INHALATION_SPRAY | Freq: Once | RESPIRATORY_TRACT | Status: DC | PRN

## 2019-06-22 MED ORDER — METHYLPREDNISOLONE SODIUM SUCC 125 MG IJ SOLR: 125.0000 mg | Freq: Once | INTRAMUSCULAR | Status: DC | PRN

## 2019-06-22 MED ORDER — SODIUM CHLORIDE 0.9 % IV SOLN
INTRAVENOUS | Status: DC | PRN
  Administered 2019-06-22: 250 mL via INTRAVENOUS

## 2019-06-22 MED ORDER — SODIUM CHLORIDE 0.9 % IV SOLN
700.0000 mg | Freq: Once | INTRAVENOUS | Status: AC
  Administered 2019-06-22: 09:00:00 700 mg via INTRAVENOUS
  Filled 2019-06-22: qty 20

## 2019-06-22 MED ORDER — DIPHENHYDRAMINE HCL 50 MG/ML IJ SOLN: 50.0000 mg | Freq: Once | INTRAMUSCULAR | Status: DC | PRN

## 2019-06-22 MED ORDER — FAMOTIDINE IN NACL 20-0.9 MG/50ML-% IV SOLN: 20.0000 mg | Freq: Once | INTRAVENOUS | Status: DC | PRN

## 2019-06-22 MED ORDER — EPINEPHRINE 0.3 MG/0.3ML IJ SOAJ: 0.3000 mg | Freq: Once | INTRAMUSCULAR | Status: DC | PRN

## 2019-06-22 NOTE — Progress Notes (Signed)
  Diagnosis: COVID-19  Physician: Dr.Kremer  Procedure: [facility] Med: bamlanivimab infusion - Provided patient with bamlanimivab fact sheet for patients, parents and caregivers prior to infusion.  Complications: No immediate complications noted.  Discharge: Discharged home   Elmon Shader N Mahayla Haddaway 06/22/2019

## 2019-06-22 NOTE — Discharge Instructions (Signed)
COVID-19 COVID-19 is a respiratory infection that is caused by a virus called severe acute respiratory syndrome coronavirus 2 (SARS-CoV-2). The disease is also known as coronavirus disease or novel coronavirus. In some people, the virus may not cause any symptoms. In others, it may cause a serious infection. The infection can get worse quickly and can lead to complications, such as:  Pneumonia, or infection of the lungs.  Acute respiratory distress syndrome or ARDS. This is fluid build-up in the lungs.  Acute respiratory failure. This is a condition in which there is not enough oxygen passing from the lungs to the body.  Sepsis or septic shock. This is a serious bodily reaction to an infection.  Blood clotting problems.  Secondary infections due to bacteria or fungus. The virus that causes COVID-19 is contagious. This means that it can spread from person to person through droplets from coughs and sneezes (respiratory secretions). What are the causes? This illness is caused by a virus. You may catch the virus by:  Breathing in droplets from an infected person's cough or sneeze.  Touching something, like a table or a doorknob, that was exposed to the virus (contaminated) and then touching your mouth, nose, or eyes. What increases the risk? Risk for infection You are more likely to be infected with this virus if you:  Live in or travel to an area with a COVID-19 outbreak.  Come in contact with a sick person who recently traveled to an area with a COVID-19 outbreak.  Provide care for or live with a person who is infected with COVID-19. Risk for serious illness You are more likely to become seriously ill from the virus if you:  Are 65 years of age or older.  Have a long-term disease that lowers your body's ability to fight infection (immunocompromised).  Live in a nursing home or long-term care facility.  Have a long-term (chronic) disease such as: ? Chronic lung disease, including  chronic obstructive pulmonary disease or asthma ? Heart disease. ? Diabetes. ? Chronic kidney disease. ? Liver disease.  Are obese. What are the signs or symptoms? Symptoms of this condition can range from mild to severe. Symptoms may appear any time from 2 to 14 days after being exposed to the virus. They include:  A fever.  A cough.  Difficulty breathing.  Chills.  Muscle pains.  A sore throat.  Loss of taste or smell. Some people may also have stomach problems, such as nausea, vomiting, or diarrhea. Other people may not have any symptoms of COVID-19. How is this diagnosed? This condition may be diagnosed based on:  Your signs and symptoms, especially if: ? You live in an area with a COVID-19 outbreak. ? You recently traveled to or from an area where the virus is common. ? You provide care for or live with a person who was diagnosed with COVID-19.  A physical exam.  Lab tests, which may include: ? A nasal swab to take a sample of fluid from your nose. ? A throat swab to take a sample of fluid from your throat. ? A sample of mucus from your lungs (sputum). ? Blood tests.  Imaging tests, which may include, X-rays, CT scan, or ultrasound. How is this treated? At present, there is no medicine to treat COVID-19. Medicines that treat other diseases are being used on a trial basis to see if they are effective against COVID-19. Your health care provider will talk with you about ways to treat your symptoms. For most   people, the infection is mild and can be managed at home with rest, fluids, and over-the-counter medicines. Treatment for a serious infection usually takes places in a hospital intensive care unit (ICU). It may include one or more of the following treatments. These treatments are given until your symptoms improve.  Receiving fluids and medicines through an IV.  Supplemental oxygen. Extra oxygen is given through a tube in the nose, a face mask, or a  hood.  Positioning you to lie on your stomach (prone position). This makes it easier for oxygen to get into the lungs.  Continuous positive airway pressure (CPAP) or bi-level positive airway pressure (BPAP) machine. This treatment uses mild air pressure to keep the airways open. A tube that is connected to a motor delivers oxygen to the body.  Ventilator. This treatment moves air into and out of the lungs by using a tube that is placed in your windpipe.  Tracheostomy. This is a procedure to create a hole in the neck so that a breathing tube can be inserted.  Extracorporeal membrane oxygenation (ECMO). This procedure gives the lungs a chance to recover by taking over the functions of the heart and lungs. It supplies oxygen to the body and removes carbon dioxide. Follow these instructions at home: Lifestyle  If you are sick, stay home except to get medical care. Your health care provider will tell you how long to stay home. Call your health care provider before you go for medical care.  Rest at home as told by your health care provider.  Do not use any products that contain nicotine or tobacco, such as cigarettes, e-cigarettes, and chewing tobacco. If you need help quitting, ask your health care provider.  Return to your normal activities as told by your health care provider. Ask your health care provider what activities are safe for you. General instructions  Take over-the-counter and prescription medicines only as told by your health care provider.  Drink enough fluid to keep your urine pale yellow.  Keep all follow-up visits as told by your health care provider. This is important. How is this prevented?  There is no vaccine to help prevent COVID-19 infection. However, there are steps you can take to protect yourself and others from this virus. To protect yourself:   Do not travel to areas where COVID-19 is a risk. The areas where COVID-19 is reported change often. To identify  high-risk areas and travel restrictions, check the CDC travel website: wwwnc.cdc.gov/travel/notices  If you live in, or must travel to, an area where COVID-19 is a risk, take precautions to avoid infection. ? Stay away from people who are sick. ? Wash your hands often with soap and water for 20 seconds. If soap and water are not available, use an alcohol-based hand sanitizer. ? Avoid touching your mouth, face, eyes, or nose. ? Avoid going out in public, follow guidance from your state and local health authorities. ? If you must go out in public, wear a cloth face covering or face mask. ? Disinfect objects and surfaces that are frequently touched every day. This may include:  Counters and tables.  Doorknobs and light switches.  Sinks and faucets.  Electronics, such as phones, remote controls, keyboards, computers, and tablets. To protect others: If you have symptoms of COVID-19, take steps to prevent the virus from spreading to others.  If you think you have a COVID-19 infection, contact your health care provider right away. Tell your health care team that you think you may   have a COVID-19 infection.  Stay home. Leave your house only to seek medical care. Do not use public transport.  Do not travel while you are sick.  Wash your hands often with soap and water for 20 seconds. If soap and water are not available, use alcohol-based hand sanitizer.  Stay away from other members of your household. Let healthy household members care for children and pets, if possible. If you have to care for children or pets, wash your hands often and wear a mask. If possible, stay in your own room, separate from others. Use a different bathroom.  Make sure that all people in your household wash their hands well and often.  Cough or sneeze into a tissue or your sleeve or elbow. Do not cough or sneeze into your hand or into the air.  Wear a cloth face covering or face mask. Where to find more  information  Centers for Disease Control and Prevention: PurpleGadgets.be  World Health Organization: https://www.castaneda.info/ Contact a health care provider if:  You live in or have traveled to an area where COVID-19 is a risk and you have symptoms of the infection.  You have had contact with someone who has COVID-19 and you have symptoms of the infection. Get help right away if:  You have trouble breathing.  You have pain or pressure in your chest.  You have confusion.  You have bluish lips and fingernails.  You have difficulty waking from sleep.  You have symptoms that get worse. These symptoms may represent a serious problem that is an emergency. Do not wait to see if the symptoms will go away. Get medical help right away. Call your local emergency services (911 in the [place] Do not drive yourself to the hospital. Let the emergency medical personnel know if you think you have COVID-19. Summary  COVID-19 is a respiratory infection that is caused by a virus. It is also known as coronavirus disease or novel coronavirus. It can cause serious infections, such as pneumonia, acute respiratory distress syndrome, acute respiratory failure, or sepsis.  The virus that causes COVID-19 is contagious. This means that it can spread from person to person through droplets from coughs and sneezes.  You are more likely to develop a serious illness if you are 75 years of age or older, have a weak immunity, live in a nursing home, or have chronic disease.  There is no medicine to treat COVID-19. Your health care provider will talk with you about ways to treat your symptoms.  Take steps to protect yourself and others from infection. Wash your hands often and disinfect objects and surfaces that are frequently touched every day. Stay away from people who are sick and wear a mask if you are sick. This information is not intended to replace advice given to you by  your health care provider. Make sure you discuss any questions you have with your health care provider. Document Released: 07/24/2018 Document Revised: 11/13/2018 Document Reviewed: 07/24/2018 Elsevier Patient Education  [place] if You Are Sick If you are sick with COVID-19 or think you might have COVID-19, follow the steps below to help protect other people in your home and community. Stay home except to get medical care.  Stay home. Most people with COVID-19 have mild illness and are able to recover at home without medical care. Do not leave your home, except to get medical care. Do not visit public areas.  Take care of yourself.  Get rest and stay hydrated.  Get medical care when needed. Call your doctor before you go to their office for care. But, if you have trouble breathing or other concerning symptoms, call 911 for immediate help.  Avoid public transportation, ride-sharing, or taxis. Separate yourself from other people and pets in your home.  As much as possible, stay in a specific room and away from other people and pets in your home. Also, you should use a separate bathroom, if available. If you need to be around other people or animals in or outside of the home, wear a cloth face covering. ? See COVID-19 and Animals if you have questions about pets: https://www.thomas.biz/ Monitor your symptoms.  Common symptoms of COVID-19 include fever and cough. Trouble breathing is a more serious symptom that means you should get medical attention.  Follow care instructions from your healthcare provider and local health department. Your local health authorities will give instructions on checking your symptoms and reporting information. If you develop emergency warning signs for COVID-19 get medical attention immediately.  Emergency warning signs include*:  Trouble breathing  Persistent pain or pressure in  the chest  New confusion or not able to be woken  Bluish lips or face *This list is not all inclusive. Please consult your medical provider for any other symptoms that are severe or concerning to you. Call 911 if you have a medical emergency. If you have a medical emergency and need to call 911, notify the operator that you have or think you might have, COVID-19. If possible, put on a facemask before medical help arrives. Call ahead before visiting your doctor.  Call ahead. Many medical visits for routine care are being postponed or done by phone or telemedicine.  If you have a medical appointment that cannot be postponed, call your doctor's office. This will help the office protect themselves and other patients. If you are sick, wear a cloth covering over your nose and mouth.  You should wear a cloth face covering over your nose and mouth if you must be around other people or animals, including pets (even at home).  You don't need to wear the cloth face covering if you are alone. If you can't put on a cloth face covering (because of trouble breathing for example), cover your coughs and sneezes in some other way. Try to stay at least 6 feet away from other people. This will help protect the people around you. Note: During the COVID-19 pandemic, medical grade facemasks are reserved for healthcare workers and some first responders. You may need to make a cloth face covering using a scarf or bandana. Cover your coughs and sneezes.  Cover your mouth and nose with a tissue when you cough or sneeze.  Throw used tissues in a lined trash can.  Immediately wash your hands with soap and water for at least 20 seconds. If soap and water are not available, clean your hands with an alcohol-based hand sanitizer that contains at least 60% alcohol. Clean your hands often.  Wash your hands often with soap and water for at least 20 seconds. This is especially important after blowing your nose, coughing, or  sneezing; going to the bathroom; and before eating or preparing food.  Use hand sanitizer if soap and water are not available. Use an alcohol-based hand sanitizer with at least 60% alcohol, covering all surfaces of your hands and rubbing them together until they feel dry.  Soap and water are the best option, especially if your  hands are visibly dirty.  Avoid touching your eyes, nose, and mouth with unwashed hands. Avoid sharing personal household items.  Do not share dishes, drinking glasses, cups, eating utensils, towels, or bedding with other people in your home.  Wash these items thoroughly after using them with soap and water or put them in the dishwasher. Clean all "high-touch" surfaces everyday.  Clean and disinfect high-touch surfaces in your "sick room" and bathroom. Let someone else clean and disinfect surfaces in common areas, but not your bedroom and bathroom.  If a caregiver or other person needs to clean and disinfect a sick person's bedroom or bathroom, they should do so on an as-needed basis. The caregiver/other person should wear a mask and wait as long as possible after the sick person has used the bathroom. High-touch surfaces include phones, remote controls, counters, tabletops, doorknobs, bathroom fixtures, toilets, keyboards, tablets, and bedside tables.  Clean and disinfect areas that may have blood, stool, or body fluids on them.  Use household cleaners and disinfectants. Clean the area or item with soap and water or another detergent if it is dirty. Then use a household disinfectant. ? Be sure to follow the instructions on the label to ensure safe and effective use of the product. Many products recommend keeping the surface wet for several minutes to ensure germs are killed. Many also recommend precautions such as wearing gloves and making sure you have good ventilation during use of the product. ? Most EPA-registered household disinfectants should be effective. How to  discontinue home isolation  People with COVID-19 who have stayed home (home isolated) can stop home isolation under the following conditions: ? If you will not have a test to determine if you are still contagious, you can leave home after these three things have happened:  You have had no fever for at least 72 hours (that is three full days of no fever without the use of medicine that reduces fevers) AND  other symptoms have improved (for example, when your cough or shortness of breath has improved) AND  at least 10 days have passed since your symptoms first appeared. ? If you will be tested to determine if you are still contagious, you can leave home after these three things have happened:  You no longer have a fever (without the use of medicine that reduces fevers) AND  other symptoms have improved (for example, when your cough or shortness of breath has improved) AND  you received two negative tests in a row, 24 hours apart. Your doctor will follow CDC guidelines. In all cases, follow the guidance of your healthcare provider and local health department. The decision to stop home isolation should be made in consultation with your healthcare provider and state and local health departments. Local decisions depend on local circumstances. [email or web address] 11/02/2018 This information is not intended to replace advice given to you by your health care provider. Make sure you discuss any questions you have with your health care provider. Document Released: 10/14/2018 Document Revised: 11/12/2018 Document Reviewed: 10/14/2018 Elsevier Patient Education  [place].

## 2019-09-21 ENCOUNTER — Ambulatory Visit: Payer: Medicare Other | Attending: Internal Medicine

## 2019-09-21 DIAGNOSIS — Z23 Encounter for immunization: Secondary | ICD-10-CM

## 2019-09-21 NOTE — Progress Notes (Signed)
   Covid-[facility] Vaccination [facility]  Name:  Phillip Wall    MRN: [id number] DOB: 1951/03/11  09/21/2019  [name] was observed post Covid-19 immunization for 15 minutes without incident. He was provided with Vaccine Information Sheet and instruction to access the V-Safe system.   [name] was instructed to call 911 with any severe reactions post vaccine: Marland Kitchen Difficulty breathing  . Swelling of face and throat  . A fast heartbeat  . A bad rash all over body  . Dizziness and weakness   Immunizations Administered    Name Date Dose VIS Date Route   Pfizer COVID-19 Vaccine 09/21/2019  8:23 AM 0.3 mL 06/12/2019 Intramuscular   Manufacturer: [place]   Lot: [id number]   [place]: [id number]

## 2019-10-19 ENCOUNTER — Ambulatory Visit: Payer: Medicare Other

## 2019-12-10 ENCOUNTER — Ambulatory Visit (INDEPENDENT_AMBULATORY_CARE_PROVIDER_SITE_OTHER): Payer: Medicare Other | Admitting: Family Medicine

## 2019-12-10 ENCOUNTER — Encounter: Payer: Self-pay | Admitting: Family Medicine

## 2019-12-10 ENCOUNTER — Other Ambulatory Visit: Payer: Self-pay

## 2019-12-10 VITALS — BP 140/70 | HR 64 | Temp 97.3°F | Ht 72.0 in | Wt 175.8 lb

## 2019-12-10 DIAGNOSIS — Z0001 Encounter for general adult medical examination with abnormal findings: Secondary | ICD-10-CM

## 2019-12-10 DIAGNOSIS — F39 Unspecified mood [affective] disorder: Secondary | ICD-10-CM

## 2019-12-10 DIAGNOSIS — Z23 Encounter for immunization: Secondary | ICD-10-CM | POA: Insufficient documentation

## 2019-12-10 DIAGNOSIS — I10 Essential (primary) hypertension: Secondary | ICD-10-CM

## 2019-12-10 MED ORDER — TELMISARTAN 80 MG PO TABS: 80.0000 mg | ORAL_TABLET | Freq: Every day | ORAL | 1 refills | Status: DC

## 2019-12-10 MED ORDER — CITALOPRAM HYDROBROMIDE 40 MG PO TABS: 40.0000 mg | ORAL_TABLET | Freq: Every day | ORAL | 1 refills | Status: DC

## 2019-12-10 NOTE — Patient Instructions (Signed)
Managing Your Hypertension Hypertension is commonly called high blood pressure. This is when the force of your blood pressing against the walls of your arteries is too strong. Arteries are blood vessels that carry blood from your heart throughout your body. Hypertension forces the heart to work harder to pump blood, and may cause the arteries to become narrow or stiff. Having untreated or uncontrolled hypertension can cause heart attack, stroke, kidney disease, and other problems. What are blood pressure readings? A blood pressure reading consists of a higher number over a lower number. Ideally, your blood pressure should be below 120/80. The first ("top") number is called the systolic pressure. It is a measure of the pressure in your arteries as your heart beats. The second ("bottom") number is called the diastolic pressure. It is a measure of the pressure in your arteries as the heart relaxes. What does my blood pressure reading mean? Blood pressure is classified into four stages. Based on your blood pressure reading, your health care provider may use the following stages to determine what type of treatment you need, if any. Systolic pressure and diastolic pressure are measured in a unit called mm Hg. Normal  Systolic pressure: below 120.  Diastolic pressure: below 80. Elevated  Systolic pressure: 120-129.  Diastolic pressure: below 80. Hypertension stage 1  Systolic pressure: 130-139.  Diastolic pressure: 80-89. Hypertension stage 2  Systolic pressure: 140 or above.  Diastolic pressure: 90 or above. What health risks are associated with hypertension? Managing your hypertension is an important responsibility. Uncontrolled hypertension can lead to:  A heart attack.  A stroke.  A weakened blood vessel (aneurysm).  Heart failure.  Kidney damage.  Eye damage.  Metabolic syndrome.  Memory and concentration problems. What changes can I make to manage my  hypertension? Hypertension can be managed by making lifestyle changes and possibly by taking medicines. Your health care provider will help you make a plan to bring your blood pressure within a normal range. Eating and drinking   Eat a diet that is high in fiber and potassium, and low in salt (sodium), added sugar, and fat. An example eating plan is called the DASH (Dietary Approaches to Stop Hypertension) diet. To eat this way: ? Eat plenty of fresh fruits and vegetables. Try to fill half of your plate at each meal with fruits and vegetables. ? Eat whole grains, such as whole wheat pasta, brown rice, or whole grain bread. Fill about one quarter of your plate with whole grains. ? Eat low-fat diary products. ? Avoid fatty cuts of meat, processed or cured meats, and poultry with skin. Fill about one quarter of your plate with lean proteins such as fish, chicken without skin, beans, eggs, and tofu. ? Avoid premade and processed foods. These tend to be higher in sodium, added sugar, and fat.  Reduce your daily sodium intake. Most people with hypertension should eat less than 1,500 mg of sodium a day.  Limit alcohol intake to no more than 1 drink a day for nonpregnant women and 2 drinks a day for men. One drink equals 12 oz of beer, 5 oz of wine, or 1 oz of hard liquor. Lifestyle  Work with your health care provider to maintain a healthy body weight, or to lose weight. Ask what an ideal weight is for you.  Get at least 30 minutes of exercise that causes your heart to beat faster (aerobic exercise) most days of the week. Activities may include walking, swimming, or biking.  Include exercise   to strengthen your muscles (resistance exercise), such as weight lifting, as part of your weekly exercise routine. Try to do these types of exercises for 30 minutes at least 3 days a week.  Do not use any products that contain nicotine or tobacco, such as cigarettes and e-cigarettes. If you need help quitting,  ask your health care provider.  Control any long-term (chronic) conditions you have, such as high cholesterol or diabetes. Monitoring  Monitor your blood pressure at home as told by your health care provider. Your personal target blood pressure may vary depending on your medical conditions, your age, and other factors.  Have your blood pressure checked regularly, as often as told by your health care provider. Working with your health care provider  Review all the medicines you take with your health care provider because there may be side effects or interactions.  Talk with your health care provider about your diet, exercise habits, and other lifestyle factors that may be contributing to hypertension.  Visit your health care provider regularly. Your health care provider can help you create and adjust your plan for managing hypertension. Will I need medicine to control my blood pressure? Your health care provider may prescribe medicine if lifestyle changes are not enough to get your blood pressure under control, and if:  Your systolic blood pressure is 130 or higher.  Your diastolic blood pressure is 80 or higher. Take medicines only as told by your health care provider. Follow the directions carefully. Blood pressure medicines must be taken as prescribed. The medicine does not work as well when you skip doses. Skipping doses also puts you at risk for problems. Contact a health care provider if:  You think you are having a reaction to medicines you have taken.  You have repeated (recurrent) headaches.  You feel dizzy.  You have swelling in your ankles.  You have trouble with your vision. Get help right away if:  You develop a severe headache or confusion.  You have unusual weakness or numbness, or you feel faint.  You have severe pain in your chest or abdomen.  You vomit repeatedly.  You have trouble breathing. Summary  Hypertension is when the force of blood pumping  through your arteries is too strong. If this condition is not controlled, it may put you at risk for serious complications.  Your personal target blood pressure may vary depending on your medical conditions, your age, and other factors. For most people, a normal blood pressure is less than 120/80.  Hypertension is managed by lifestyle changes, medicines, or both. Lifestyle changes include weight loss, eating a healthy, low-sodium diet, exercising more, and limiting alcohol. This information is not intended to replace advice given to you by your health care provider. Make sure you discuss any questions you have with your health care provider. Document Revised: 10/10/2018 Document Reviewed: 05/16/2016 Elsevier Patient Education  [employer].  Mindfulness-Based Stress Reduction Mindfulness-based stress reduction (MBSR) is a program that helps people learn to practice mindfulness. Mindfulness is the practice of intentionally paying attention to the present moment. It can be learned and practiced through techniques such as education, breathing exercises, meditation, and yoga. MBSR includes several mindfulness techniques in one program. MBSR works best when you understand the treatment, are willing to try new things, and can commit to spending time practicing what you learn. MBSR training may include learning about:  How your emotions, thoughts, and reactions affect your body.  New ways to respond to things that cause  negative thoughts to start (triggers).  How to notice your thoughts and let go of them.  Practicing awareness of everyday things that you normally do without thinking.  The techniques and goals of different types of meditation. What are the benefits of MBSR? MBSR can have many benefits, which include helping you to:  Develop self-awareness. This refers to knowing and understanding yourself.  Learn skills and attitudes that help you to participate in your own health  care.  Learn new ways to care for yourself.  Be more accepting about how things are, and let things go.  Be less judgmental and approach things with an open mind.  Be patient with yourself and trust yourself more. MBSR has also been shown to:  Reduce negative emotions, such as depression and anxiety.  Improve memory and focus.  Change how you sense and approach pain.  Boost your body's ability to fight infections.  Help you connect better with other people.  Improve your sense of well-being. Follow these instructions at home:   Find a local in-person or online MBSR program.  Set aside some time regularly for mindfulness practice.  Find a mindfulness practice that works best for you. This may include one or more of the following: ? Meditation. Meditation involves focusing your mind on a certain thought or activity. ? Breathing awareness exercises. These help you to stay present by focusing on your breath. ? Body scan. For this practice, you lie down and pay attention to each part of your body from head to toe. You can identify tension and soreness and intentionally relax parts of your body. ? Yoga. Yoga involves stretching and breathing, and it can improve your ability to move and be flexible. It can also provide an experience of testing your body's limits, which can help you release stress. ? Mindful eating. This way of eating involves focusing on the taste, texture, color, and smell of each bite of food. Because this slows down eating and helps you feel full sooner, it can be an important part of a weight-loss plan.  Find a podcast or recording that provides guidance for breathing awareness, body scan, or meditation exercises. You can listen to these any time when you have a free moment to rest without distractions.  Follow your treatment plan as told by your health care provider. This may include taking regular medicines and making changes to your diet or lifestyle as  recommended. How to practice mindfulness To do a basic awareness exercise:  Find a comfortable place to sit.  Pay attention to the present moment. Observe your thoughts, feelings, and surroundings just as they are.  Avoid placing judgment on yourself, your feelings, or your surroundings. Make note of any judgment that comes up, and let it go.  Your mind may wander, and that is okay. Make note of when your thoughts drift, and return your attention to the present moment. To do basic mindfulness meditation:  Find a comfortable place to sit. This may include a stable chair or a firm floor cushion. ? Sit upright with your back straight. Let your arms fall next to your side with your hands resting on your legs. ? If sitting in a chair, rest your feet flat on the floor. ? If sitting on a cushion, cross your legs in front of you.  Keep your head in a neutral position with your chin dropped slightly. Relax your jaw and rest the tip of your tongue on the roof of your mouth. Drop your gaze  to the floor. You can close your eyes if you like.  Breathe normally and pay attention to your breath. Feel the air moving in and out of your nose. Feel your belly expanding and relaxing with each breath.  Your mind may wander, and that is okay. Make note of when your thoughts drift, and return your attention to your breath.  Avoid placing judgment on yourself, your feelings, or your surroundings. Make note of any judgment or feelings that come up, let them go, and bring your attention back to your breath.  When you are ready, lift your gaze or open your eyes. Pay attention to how your body feels after the meditation. Where to find more information You can find more information about MBSR from:  Your health care provider.  Community-based meditation centers or programs.  Programs offered near you. Summary  Mindfulness-based stress reduction (MBSR) is a program that teaches you how to intentionally pay  attention to the present moment. It is used with other treatments to help you cope better with daily stress, emotions, and pain.  MBSR focuses on developing self-awareness, which allows you to respond to life stress without judgment or negative emotions.  MBSR programs may involve learning different mindfulness practices, such as breathing exercises, meditation, yoga, body scan, or mindful eating. Find a mindfulness practice that works best for you, and set aside time for it on a regular basis. This information is not intended to replace advice given to you by your health care provider. Make sure you discuss any questions you have with your health care provider. Document Revised: 05/31/2017 Document Reviewed: 10/25/2016 Elsevier Patient Education  [place].

## 2019-12-10 NOTE — Progress Notes (Signed)
Established Patient Office Visit  Subjective:  Patient ID: Phillip Wall, male    DOB: Mar 11, 1951  Age: 69 y.o. MRN: [id number]  CC:  Chief Complaint  Patient presents with  . Annual Exam    Patient is here today for a CPE. Patient's last Colonoscopy was performed by Dr. Truman Hayward in 2017.  He is also due for PNV-13 Vaccine which he agrees to. He is currently fasting.    HPI Phillip Wall presents for follow-up of his hypertension mood disorder.  Blood pressure has been running in the upper 120s over 70s on the telmisartan.  No issues with the medication continues to take the Celexa without issue.  Status post Covid that was treated with antibiotics.  He is actually had his vaccine.  Past Medical History:  Diagnosis Date  . Anxiety   . Depression   . ED (erectile dysfunction)   . History of colonic polyps   . Hypertension     Past Surgical History:  Procedure Laterality Date  . TONSILECTOMY/ADENOIDECTOMY WITH MYRINGOTOMY    . VASECTOMY  1986    Family History  Problem Relation Age of Onset  . Bladder Cancer Mother   . Heart disease Father   . Heart attack Father   . Breast cancer Paternal Grandmother   . Breast cancer Daughter   . Alcohol abuse Sister   . Hypertension Sister   . Cancer Sister   . Drug abuse Sister     Social History   Socioeconomic History  . Marital status: Married    Spouse name: Not on file  . Number of children: 3  . Years of education: Not on file  . Highest education level: Not on file  Occupational History  . Not on file  Tobacco Use  . Smoking status: Never Smoker  . Smokeless tobacco: Never Used  Substance and Sexual Activity  . Alcohol use: Not Currently  . Drug use: Never  . Sexual activity: Yes  Other Topics Concern  . Not on file  Social History Narrative  . Not on file   Social Determinants of Health   Financial Resource Strain:   . Difficulty of Paying Living Expenses:   Food Insecurity:   . Worried About Paediatric nurse in the Last Year:   . Arboriculturist in the Last Year:   Transportation Needs:   . Film/video editor (Medical):   Marland Kitchen Lack of Transportation (Non-Medical):   Physical Activity:   . Days of Exercise per Week:   . Minutes of Exercise per Session:   Stress:   . Feeling of Stress :   Social Connections:   . Frequency of Communication with Friends and Family:   . Frequency of Social Gatherings with Friends and Family:   . Attends Religious Services:   . Active Member of Clubs or Organizations:   . Attends Archivist Meetings:   Marland Kitchen Marital Status:   Intimate Partner Violence:   . Fear of Current or Ex-Partner:   . Emotionally Abused:   Marland Kitchen Physically Abused:   . Sexually Abused:     Outpatient Medications Prior to Visit  Medication Sig Dispense Refill  . famotidine (PEPCID) 40 MG tablet Take one daily as needed for indigestion 90 tablet 1  . sildenafil (REVATIO) 20 MG tablet Take 1-3 tablets 45 minutes prior... No more than 5 pills daily 50 tablet 1  . citalopram (CELEXA) 20 MG tablet Take 2 tablets (40 mg total)  by mouth daily. 180 tablet 2  . telmisartan (MICARDIS) 80 MG tablet Take 1 tablet (80 mg total) by mouth daily. 90 tablet 3   No facility-administered medications prior to visit.    No Known Allergies  ROS Review of Systems  Constitutional: Negative.   HENT: Negative.   Eyes: Negative for photophobia and visual disturbance.  Respiratory: Negative.   Cardiovascular: Negative.   Gastrointestinal: Negative.   Endocrine: Negative for polyphagia and polyuria.  Genitourinary: Negative for difficulty urinating and frequency.  Musculoskeletal: Negative for gait problem and joint swelling.  Skin: Negative for pallor and rash.  Allergic/Immunologic: Negative for immunocompromised state.  Neurological: Negative for light-headedness and numbness.  Hematological: Does not bruise/bleed easily.   Depression screen [facility] 2/9 12/10/2019 03/12/2019 09/01/2018    Decreased Interest 0 0 1  Down, Depressed, Hopeless 0 0 1  PHQ - 2 Score 0 0 2  Altered sleeping 0 1 0  Tired, decreased energy 0 0 0  Change in appetite 0 0 1  Feeling bad or failure about yourself  0 0 1  Trouble concentrating 0 0 1  Moving slowly or fidgety/restless 0 0 0  Suicidal thoughts 0 0 1  PHQ-9 Score 0 1 6  Difficult doing work/chores Not difficult at all - -      Objective:    Physical Exam Vitals and nursing note reviewed.  Constitutional:      General: He is not in acute distress.    Appearance: Normal appearance. He is normal weight. He is not ill-appearing, toxic-appearing or diaphoretic.  HENT:     Head: Normocephalic and atraumatic.     Right Ear: Tympanic membrane, ear canal and external ear normal.     Left Ear: Tympanic membrane, ear canal and external ear normal.  Eyes:     General: No scleral icterus.       Right eye: No discharge.        Left eye: No discharge.     Extraocular Movements: Extraocular movements intact.     Conjunctiva/sclera: Conjunctivae normal.     Pupils: Pupils are equal, round, and reactive to light.  Cardiovascular:     Rate and Rhythm: Normal rate and regular rhythm.  Pulmonary:     Effort: Pulmonary effort is normal.     Breath sounds: Normal breath sounds.  Abdominal:     General: Abdomen is flat. Bowel sounds are normal. There is no distension.     Palpations: Abdomen is soft. There is no mass.     Tenderness: There is no abdominal tenderness. There is no guarding or rebound.     Hernia: No hernia is present.  Genitourinary:    Prostate: Not enlarged, not tender and no nodules present.     Rectum: Guaiac result negative. No mass, tenderness, anal fissure, external hemorrhoid or internal hemorrhoid. Normal anal tone.  Musculoskeletal:     Cervical back: No rigidity or tenderness.     Right lower leg: No edema.     Left lower leg: No edema.  Lymphadenopathy:     Cervical: No cervical adenopathy.  Skin:    General:  Skin is warm and dry.  Neurological:     Mental Status: He is alert and oriented to person, place, and time.     BP 140/70 (BP Location: Left Arm, Patient Position: Sitting, Cuff Size: Normal)   Pulse 64   Temp (!) 97.3 F (36.3 C) (Temporal)   Ht 6' (1.829 m)   Wt 175 lb  12.8 oz (79.7 kg)   SpO2 99%   BMI 23.84 kg/m  Wt Readings from Last 3 Encounters:  12/10/19 175 lb 12.8 oz (79.7 kg)  03/12/19 165 lb (74.8 kg)  09/01/18 168 lb (76.2 kg)     Health Maintenance Due  Topic Date Due  . TETANUS/TDAP  07/30/2019  . COVID-19 Vaccine (2 - Pfizer 2-dose series) 10/12/2019    There are no preventive care reminders to display for this patient.  Lab Results  Component Value Date   TSH 2.12 10/27/2016   Lab Results  Component Value Date   WBC 4.6 09/01/2018   HGB 13.0 09/01/2018   HCT 38.6 (L) 09/01/2018   MCV 87.5 09/01/2018   PLT 224.0 09/01/2018   Lab Results  Component Value Date   NA 139 03/12/2019   K 4.0 03/12/2019   CO2 29 03/12/2019   GLUCOSE 89 03/12/2019   BUN 15 03/12/2019   CREATININE 1.16 03/12/2019   BILITOT 0.8 09/01/2018   ALKPHOS 65 09/01/2018   AST 14 09/01/2018   ALT 12 09/01/2018   PROT 7.0 09/01/2018   ALBUMIN 4.2 09/01/2018   CALCIUM 9.2 03/12/2019   GFR 75.55 03/12/2019   Lab Results  Component Value Date   CHOL 125 09/01/2018   Lab Results  Component Value Date   HDL 52.20 09/01/2018   Lab Results  Component Value Date   LDLCALC 63 09/01/2018   Lab Results  Component Value Date   TRIG 51.0 09/01/2018   Lab Results  Component Value Date   CHOLHDL 2 09/01/2018   No results found for: HGBA1C    Assessment & Plan:   Problem List Items Addressed This Visit      Cardiovascular and Mediastinum   Essential hypertension   Relevant Medications   telmisartan (MICARDIS) 80 MG tablet   Other Relevant Orders   CBC   Comprehensive metabolic panel   Urinalysis, Routine w reflex microscopic     Other   Mood disorder ([place])     Relevant Medications   citalopram (CELEXA) 40 MG tablet   Encounter for health maintenance examination with abnormal findings   Relevant Orders   Lipid panel   PSA   Need for pneumococcal vaccination - Primary   Relevant Orders   Pneumococcal polysaccharide vaccine 23-valent greater than or equal to 2yo subcutaneous/IM (Completed)      Meds ordered this encounter  Medications  . telmisartan (MICARDIS) 80 MG tablet    Sig: Take 1 tablet (80 mg total) by mouth daily.    Dispense:  90 tablet    Refill:  1  . citalopram (CELEXA) 40 MG tablet    Sig: Take 1 tablet (40 mg total) by mouth daily.    Dispense:  90 tablet    Refill:  1    Follow-up: Return in about 6 months (around 06/10/2020).    Libby Maw, MD

## 2019-12-14 ENCOUNTER — Other Ambulatory Visit: Payer: Self-pay

## 2019-12-14 ENCOUNTER — Other Ambulatory Visit (INDEPENDENT_AMBULATORY_CARE_PROVIDER_SITE_OTHER): Payer: Medicare Other

## 2019-12-14 DIAGNOSIS — I10 Essential (primary) hypertension: Secondary | ICD-10-CM | POA: Diagnosis not present

## 2019-12-14 DIAGNOSIS — Z125 Encounter for screening for malignant neoplasm of prostate: Secondary | ICD-10-CM | POA: Diagnosis not present

## 2019-12-14 DIAGNOSIS — Z0001 Encounter for general adult medical examination with abnormal findings: Secondary | ICD-10-CM

## 2019-12-14 LAB — CBC
HCT: 39.5 % (ref 39.0–52.0)
Hemoglobin: 13.2 g/dL (ref 13.0–17.0)
MCHC: 33.5 g/dL (ref 30.0–36.0)
MCV: 88 fl (ref 78.0–100.0)
Platelets: 218 10*3/uL (ref 150.0–400.0)
RBC: 4.49 Mil/uL (ref 4.22–5.81)
RDW: 14.3 % (ref 11.5–15.5)
WBC: 5.7 10*3/uL (ref 4.0–10.5)

## 2019-12-14 LAB — URINALYSIS, ROUTINE W REFLEX MICROSCOPIC
Bilirubin Urine: NEGATIVE
Hgb urine dipstick: NEGATIVE
Ketones, ur: NEGATIVE
Leukocytes,Ua: NEGATIVE
Nitrite: NEGATIVE
RBC / HPF: NONE SEEN (ref 0–?)
Specific Gravity, Urine: 1.015 (ref 1.000–1.030)
Total Protein, Urine: NEGATIVE
Urine Glucose: NEGATIVE
Urobilinogen, UA: 0.2 (ref 0.0–1.0)
WBC, UA: NONE SEEN (ref 0–?)
pH: 5.5 (ref 5.0–8.0)

## 2019-12-14 LAB — COMPREHENSIVE METABOLIC PANEL
ALT: 14 U/L (ref 0–53)
AST: 16 U/L (ref 0–37)
Albumin: 4.3 g/dL (ref 3.5–5.2)
Alkaline Phosphatase: 66 U/L (ref 39–117)
BUN: 16 mg/dL (ref 6–23)
CO2: 25 mEq/L (ref 19–32)
Calcium: 9.1 mg/dL (ref 8.4–10.5)
Chloride: 103 mEq/L (ref 96–112)
Creatinine, Ser: 1.25 mg/dL (ref 0.40–1.50)
GFR: 69.15 mL/min (ref >60.00)
Glucose, Bld: 92 mg/dL (ref 70–99)
Potassium: 4 mEq/L (ref 3.5–5.1)
Sodium: 134 mEq/L — ABNORMAL LOW (ref 135–145)
Total Bilirubin: 1 mg/dL (ref 0.2–1.2)
Total Protein: 7.2 g/dL (ref 6.0–8.3)

## 2019-12-14 LAB — LIPID PANEL
Cholesterol: 156 mg/dL (ref 0–200)
HDL: 52.2 mg/dL (ref >39.00)
LDL Cholesterol: 95 mg/dL (ref 0–99)
NonHDL: 103.83
Total CHOL/HDL Ratio: 3
Triglycerides: 45 mg/dL (ref 0.0–149.0)
VLDL: 9 mg/dL (ref 0.0–40.0)

## 2019-12-14 LAB — PSA: PSA: 0.5 ng/mL (ref 0.10–4.00)

## 2019-12-22 ENCOUNTER — Telehealth: Payer: Self-pay | Admitting: Family Medicine

## 2019-12-22 NOTE — Telephone Encounter (Signed)
Patients wife called to see if patients Micardis can be switched to a local pharmacy ([facility] on [address] instead of [facility]. She said that he will be out by the end of the week. Please call her at [phone] if you have any questions.  Thank you, Burley Saver

## 2019-12-23 ENCOUNTER — Other Ambulatory Visit: Payer: Self-pay

## 2019-12-23 DIAGNOSIS — I10 Essential (primary) hypertension: Secondary | ICD-10-CM

## 2019-12-23 MED ORDER — TELMISARTAN 80 MG PO TABS: 80.0000 mg | ORAL_TABLET | Freq: Every day | ORAL | 1 refills | Status: DC

## 2019-12-23 NOTE — Telephone Encounter (Signed)
[facility] they do not have pending medications for patient Rx sent to [facility] Phillip Wall aware and will pick up.

## 2019-12-23 NOTE — Telephone Encounter (Signed)
Patients wife Tomasa Hosteller) is calling back to check the status of patients refill request.  She can be reached at [phone].  Thank you, Burley Saver

## 2020-06-06 ENCOUNTER — Other Ambulatory Visit: Payer: Self-pay

## 2020-06-07 ENCOUNTER — Encounter: Payer: Self-pay | Admitting: Family

## 2020-06-07 ENCOUNTER — Ambulatory Visit (INDEPENDENT_AMBULATORY_CARE_PROVIDER_SITE_OTHER): Payer: Medicare Other

## 2020-06-07 ENCOUNTER — Ambulatory Visit (INDEPENDENT_AMBULATORY_CARE_PROVIDER_SITE_OTHER): Payer: Medicare Other | Admitting: Family

## 2020-06-07 VITALS — BP 140/72 | HR 77 | Temp 97.1°F | Ht 72.0 in | Wt 180.0 lb

## 2020-06-07 VITALS — BP 140/72 | HR 77 | Temp 97.1°F | Resp 16 | Ht 72.0 in | Wt 180.0 lb

## 2020-06-07 DIAGNOSIS — Z0000 Encounter for general adult medical examination without abnormal findings: Secondary | ICD-10-CM | POA: Diagnosis not present

## 2020-06-07 DIAGNOSIS — Z23 Encounter for immunization: Secondary | ICD-10-CM

## 2020-06-07 DIAGNOSIS — I10 Essential (primary) hypertension: Secondary | ICD-10-CM | POA: Diagnosis not present

## 2020-06-07 DIAGNOSIS — D2339 Other benign neoplasm of skin of other parts of face: Secondary | ICD-10-CM | POA: Diagnosis not present

## 2020-06-07 DIAGNOSIS — F39 Unspecified mood [affective] disorder: Secondary | ICD-10-CM | POA: Diagnosis not present

## 2020-06-07 NOTE — Patient Instructions (Signed)

## 2020-06-07 NOTE — Progress Notes (Signed)
Subjective:   Phillip Wall is a 69 y.o. male who presents for an Initial Medicare Annual Wellness Visit.  Review of Systems     Cardiac Risk Factors include: advanced age (>55men, >50 women);male gender;hypertension     Objective:    Today's Vitals   06/07/20 1326  BP: 140/72  Pulse: 77  Resp: 16  Temp: (!) 97.1 F (36.2 C)  TempSrc: Temporal  SpO2: 98%  Weight: 180 lb (81.6 kg)  Height: 6' (1.829 m)   Body mass index is 24.41 kg/m.  Advanced Directives 06/07/2020  Does Patient Have a Medical Advance Directive? No  Would patient like information on creating a medical advance directive? Yes (MAU/Ambulatory/Procedural Areas - Information given)    Current Medications (verified) Outpatient Encounter Medications as of 06/07/2020  Medication Sig  . citalopram (CELEXA) 40 MG tablet Take 1 tablet (40 mg total) by mouth daily.  . famotidine (PEPCID) 40 MG tablet Take one daily as needed for indigestion (Patient not taking: Reported on 06/07/2020)  . sildenafil (REVATIO) 20 MG tablet Take 1-3 tablets 45 minutes prior... No more than 5 pills daily  . telmisartan (MICARDIS) 80 MG tablet Take 1 tablet (80 mg total) by mouth daily.   No facility-administered encounter medications on file as of 06/07/2020.    Allergies (verified) Patient has no known allergies.   History: Past Medical History:  Diagnosis Date  . Anxiety   . Depression   . ED (erectile dysfunction)   . History of colonic polyps   . Hypertension    Past Surgical History:  Procedure Laterality Date  . TONSILECTOMY/ADENOIDECTOMY WITH MYRINGOTOMY    . VASECTOMY  1986   Family History  Problem Relation Age of Onset  . Bladder Cancer Mother   . Heart disease Father   . Heart attack Father   . Breast cancer Paternal Grandmother   . Breast cancer Daughter   . Alcohol abuse Sister   . Hypertension Sister   . Cancer Sister   . Drug abuse Sister    Social History   Socioeconomic History  . Marital  status: Married    Spouse name: Not on file  . Number of children: 3  . Years of education: Not on file  . Highest education level: Not on file  Occupational History  . Occupation: retired  Tobacco Use  . Smoking status: Never Smoker  . Smokeless tobacco: Never Used  Substance and Sexual Activity  . Alcohol use: Not Currently  . Drug use: Never  . Sexual activity: Yes  Other Topics Concern  . Not on file  Social History Narrative  . Not on file   Social Determinants of Health   Financial Resource Strain: Low Risk   . Difficulty of Paying Living Expenses: Not hard at all  Food Insecurity: No Food Insecurity  . Worried About Charity fundraiser in the Last Year: Never true  . Ran Out of Food in the Last Year: Never true  Transportation Needs: No Transportation Needs  . Lack of Transportation (Medical): No  . Lack of Transportation (Non-Medical): No  Physical Activity: Insufficiently Active  . Days of Exercise per Week: 2 days  . Minutes of Exercise per Session: 60 min  Stress: No Stress Concern Present  . Feeling of Stress : Not at all  Social Connections: Moderately Integrated  . Frequency of Communication with Friends and Family: More than three times a week  . Frequency of Social Gatherings with Friends and Family: More  than three times a week  . Attends Religious Services: More than 4 times per year  . Active Member of Clubs or Organizations: No  . Attends Archivist Meetings: Never  . Marital Status: Married    Tobacco Counseling Counseling given: Not Answered   Clinical Intake:  Pre-visit preparation completed: Yes  Pain : No/denies pain     Nutritional Status: BMI of 19-24  Normal Nutritional Risks: None Diabetes: No  How often do you need to have someone help you when you read instructions, pamphlets, or other written materials from your doctor or pharmacy?: 1 - Never What is the last grade level you completed in school?: 12th  grade  Diabetic?No  Interpreter Needed?: No  Information entered by :: Caroleen Hamman LPN   Activities of Daily Living In your present state of health, do you have any difficulty performing the following activities: 06/07/2020 12/10/2019  Hearing? N N  Vision? N N  Difficulty concentrating or making decisions? N N  Walking or climbing stairs? N N  Dressing or bathing? N N  Doing errands, shopping? N N  Preparing Food and eating ? N -  Using the Toilet? N -  In the past six months, have you accidently leaked urine? N -  Do you have problems with loss of bowel control? N -  Managing your Medications? N -  Managing your Finances? N -  Housekeeping or managing your Housekeeping? N -  Some recent data might be hidden    Patient Care Team: Libby Maw, MD as PCP - General (Family Medicine)  Indicate any recent Medical Services you may have received from other than [facility] providers in the past year (date may be approximate).     Assessment:   This is a routine wellness examination for [facility].  Hearing/Vision screen  Hearing Screening   125Hz  250Hz  500Hz  1000Hz  2000Hz  3000Hz  4000Hz  6000Hz  8000Hz   Right ear:           Left ear:           Comments: No issues  Vision Screening Comments: Wears glasses Last eye exam-52months ago-Dr. Herbert Deaner  Dietary issues and exercise activities discussed: Current Exercise Habits: Home exercise routine, Type of exercise: walking, Time (Minutes): 60, Frequency (Times/Week): 2, Weekly Exercise (Minutes/Week): 120, Intensity: Mild  Goals    . Patient Stated     Increase activity & drink more water      Depression Screen PHQ 2/9 Scores 06/07/2020 12/10/2019 03/12/2019 09/01/2018 12/16/2017 12/16/2017  PHQ - 2 Score 0 0 0 2 2 0  PHQ- 9 Score - 0 1 6 4  -    Fall Risk Fall Risk  06/07/2020 12/10/2019 12/16/2017  Falls in the past year? 0 0 No  Number falls in past yr: 0 - -  Injury with Fall? 0 - -  Follow up Falls prevention discussed Falls  evaluation completed -    FALL RISK PREVENTION PERTAINING TO THE HOME:  Any stairs in or around the home? No  Home free of loose throw rugs in walkways, pet beds, electrical cords, etc? Yes  Adequate lighting in your home to reduce risk of falls? Yes   ASSISTIVE DEVICES UTILIZED TO PREVENT FALLS:  Life alert? No  Use of a cane, walker or w/c? No  Grab bars in the bathroom? Yes  Shower chair or bench in shower? No  Elevated toilet seat or a handicapped toilet? No   TIMED UP AND GO:  Was the test performed? Yes .  Length of time to ambulate 10 feet: 9 sec.   Gait steady and fast without use of assistive device  Cognitive Function:Normal cognitive status assessed by direct observation by this Nurse Health Advisor. No abnormalities found.          Immunizations Immunization History  Administered Date(s) Administered  . Fluad Quad(high Dose 65+) 03/12/2019, 06/07/2020  . Influenza,inj,Quad PF,6+ Mos 05/02/2018  . PFIZER SARS-COV-2 Vaccination 09/21/2019, 10/15/2019, 04/11/2020  . Pneumococcal Polysaccharide-23 12/10/2019  . Td 07/29/2009    TDAP status: Due, Education has been provided regarding the importance of this vaccine. Advised may receive this vaccine at local pharmacy or Health Dept. Aware to provide a copy of the vaccination record if obtained from local pharmacy or Health Dept. Verbalized acceptance and understanding.  Flu Vaccine status: Completed at today's visit  Pneumococcal vaccine status: Up to date  Covid-19 vaccine status: Completed vaccines  Qualifies for Shingles Vaccine? Yes   Zostavax completed No   Shingrix Completed?: No.    Education has been provided regarding the importance of this vaccine. Patient has been advised to call insurance company to determine out of pocket expense if they have not yet received this vaccine. Advised may also receive vaccine at local pharmacy or Health Dept. Verbalized acceptance and understanding.  Screening  Tests Health Maintenance  Topic Date Due  . TETANUS/TDAP  07/30/2019  . PNA vac Low Risk Adult (2 of 2 - PCV13) 12/09/2020  . COLONOSCOPY  11/15/2025  . INFLUENZA VACCINE  Completed  . COVID-19 Vaccine  Completed  . Hepatitis C Screening  Completed    Health Maintenance  Health Maintenance Due  Topic Date Due  . TETANUS/TDAP  07/30/2019    Colorectal cancer screening: Type of screening: Colonoscopy. Completed 11/16/2015. Repeat every 10 years  Lung Cancer Screening: (Low Dose CT Chest recommended if Age 61-80 years, 30 pack-year currently smoking OR have quit w/in 15years.) does not qualify.     Additional Screening:  Hepatitis C Screening:Completed 10/30/2016  Vision Screening: Recommended annual ophthalmology exams for early detection of glaucoma and other disorders of the eye. Is the patient up to date with their annual eye exam?  Yes  Who is the provider or what is the name of the office in which the patient attends annual eye exams? Dr. Herbert Deaner   Dental Screening: Recommended annual dental exams for proper oral hygiene  Community Resource Referral / Chronic Care Management: CRR required this visit?  No   CCM required this visit?  No      Plan:     I have personally reviewed and noted the following in the patient's chart:   . Medical and social history . Use of alcohol, tobacco or illicit drugs  . Current medications and supplements . Functional ability and status . Nutritional status . Physical activity . Advanced directives . List of other physicians . Hospitalizations, surgeries, and ER visits in previous 12 months . Vitals . Screenings to include cognitive, depression, and falls . Referrals and appointments  In addition, I have reviewed and discussed with patient certain preventive protocols, quality metrics, and best practice recommendations. A written personalized care plan for preventive services as well as general preventive health recommendations  were provided to patient.     Marta Antu, LPN   07/08/107  Nurse Health Advisor  Nurse Notes: None

## 2020-06-07 NOTE — Patient Instructions (Addendum)
Phillip Wall , Thank you for taking time to complete your Medicare Wellness Visit. I appreciate your ongoing commitment to your health goals. Please review the following plan we discussed and let me know if I can assist you in the future.   Screening recommendations/referrals: Colonoscopy: Completed 11/16/2015-Due 11/15/2025 Recommended yearly ophthalmology/optometry visit for glaucoma screening and checkup Recommended yearly dental visit for hygiene and checkup  Vaccinations: Influenza vaccine: Completed today Pneumococcal vaccine: Completed vaccines Tdap vaccine: Discuss with pharmacy Shingles vaccine: Discuss with pharmacy  Covid-19: Completed vaccines  Advanced directives: Information given today  Conditions/risks identified: See problem list  Next appointment: Follow up in one year for your annual wellness visit. 06/13/21 @ 1:30  Preventive Care 65 Years and Older, Male Preventive care refers to lifestyle choices and visits with your health care provider that can promote health and wellness. What does preventive care include?  A yearly physical exam. This is also called an annual well check.  Dental exams once or twice a year.  Routine eye exams. Ask your health care provider how often you should have your eyes checked.  Personal lifestyle choices, including:  Daily care of your teeth and gums.  Regular physical activity.  Eating a healthy diet.  Avoiding tobacco and drug use.  Limiting alcohol use.  Practicing safe sex.  Taking low doses of aspirin every day.  Taking vitamin and mineral supplements as recommended by your health care provider. What happens during an annual well check? The services and screenings done by your health care provider during your annual well check will depend on your age, overall health, lifestyle risk factors, and family history of disease. Counseling  Your health care provider may ask you questions about your:  Alcohol  use.  Tobacco use.  Drug use.  Emotional well-being.  Home and relationship well-being.  Sexual activity.  Eating habits.  History of falls.  Memory and ability to understand (cognition).  Work and work Statistician. Screening  You may have the following tests or measurements:  Height, weight, and BMI.  Blood pressure.  Lipid and cholesterol levels. These may be checked every 5 years, or more frequently if you are over 69 years old.  Skin check.  Lung cancer screening. You may have this screening every year starting at age 70 if you have a 30-pack-year history of smoking and currently smoke or have quit within the past 15 years.  Fecal occult blood test (FOBT) of the stool. You may have this test every year starting at age 70.  Flexible sigmoidoscopy or colonoscopy. You may have a sigmoidoscopy every 5 years or a colonoscopy every 10 years starting at age 70.  Prostate cancer screening. Recommendations will vary depending on your family history and other risks.  Hepatitis C blood test.  Hepatitis B blood test.  Sexually transmitted disease (STD) testing.  Diabetes screening. This is done by checking your blood sugar (glucose) after you have not eaten for a while (fasting). You may have this done every 1-3 years.  Abdominal aortic aneurysm (AAA) screening. You may need this if you are a current or former smoker.  Osteoporosis. You may be screened starting at age 70 if you are at high risk. Talk with your health care provider about your test results, treatment options, and if necessary, the need for more tests. Vaccines  Your health care provider may recommend certain vaccines, such as:  Influenza vaccine. This is recommended every year.  Tetanus, diphtheria, and acellular pertussis (Tdap, Td) vaccine. You may  need a Td booster every 10 years.  Zoster vaccine. You may need this after age 70.  Pneumococcal 13-valent conjugate (PCV13) vaccine. One dose is  recommended after age 70.  Pneumococcal polysaccharide (PPSV23) vaccine. One dose is recommended after age 70. Talk to your health care provider about which screenings and vaccines you need and how often you need them. This information is not intended to replace advice given to you by your health care provider. Make sure you discuss any questions you have with your health care provider. Document Released: 07/15/2015 Document Revised: 03/07/2016 Document Reviewed: 04/19/2015 Elsevier Interactive Patient Education  2017 [place] Prevention in the Home Falls can cause injuries. They can happen to people of all ages. There are many things you can do to make your home safe and to help prevent falls. What can I do on the outside of my home?  Regularly fix the edges of walkways and driveways and fix any cracks.  Remove anything that might make you trip as you walk through a door, such as a raised step or threshold.  Trim any bushes or trees on the path to your home.  Use bright outdoor lighting.  Clear any walking paths of anything that might make someone trip, such as rocks or tools.  Regularly check to see if handrails are loose or broken. Make sure that both sides of any steps have handrails.  Any raised decks and porches should have guardrails on the edges.  Have any leaves, snow, or ice cleared regularly.  Use sand or salt on walking paths during winter.  Clean up any spills in your garage right away. This includes oil or grease spills. What can I do in the bathroom?  Use night lights.  Install grab bars by the toilet and in the tub and shower. Do not use towel bars as grab bars.  Use non-skid mats or decals in the tub or shower.  If you need to sit down in the shower, use a plastic, non-slip stool.  Keep the floor dry. Clean up any water that spills on the floor as soon as it happens.  Remove soap buildup in the tub or shower regularly.  Attach bath mats  securely with double-sided non-slip rug tape.  Do not have throw rugs and other things on the floor that can make you trip. What can I do in the bedroom?  Use night lights.  Make sure that you have a light by your bed that is easy to reach.  Do not use any sheets or blankets that are too big for your bed. They should not hang down onto the floor.  Have a firm chair that has side arms. You can use this for support while you get dressed.  Do not have throw rugs and other things on the floor that can make you trip. What can I do in the kitchen?  Clean up any spills right away.  Avoid walking on wet floors.  Keep items that you use a lot in easy-to-reach places.  If you need to reach something above you, use a strong step stool that has a grab bar.  Keep electrical cords out of the way.  Do not use floor polish or wax that makes floors slippery. If you must use wax, use non-skid floor wax.  Do not have throw rugs and other things on the floor that can make you trip. What can I do with my stairs?  Do not leave any items on  the stairs.  Make sure that there are handrails on both sides of the stairs and use them. Fix handrails that are broken or loose. Make sure that handrails are as long as the stairways.  Check any carpeting to make sure that it is firmly attached to the stairs. Fix any carpet that is loose or worn.  Avoid having throw rugs at the top or bottom of the stairs. If you do have throw rugs, attach them to the floor with carpet tape.  Make sure that you have a light switch at the top of the stairs and the bottom of the stairs. If you do not have them, ask someone to add them for you. What else can I do to help prevent falls?  Wear shoes that:  Do not have high heels.  Have rubber bottoms.  Are comfortable and fit you well.  Are closed at the toe. Do not wear sandals.  If you use a stepladder:  Make sure that it is fully opened. Do not climb a closed  stepladder.  Make sure that both sides of the stepladder are locked into place.  Ask someone to hold it for you, if possible.  Clearly mark and make sure that you can see:  Any grab bars or handrails.  First and last steps.  Where the edge of each step is.  Use tools that help you move around (mobility aids) if they are needed. These include:  Canes.  Walkers.  Scooters.  Crutches.  Turn on the lights when you go into a dark area. Replace any light bulbs as soon as they burn out.  Set up your furniture so you have a clear path. Avoid moving your furniture around.  If any of your floors are uneven, fix them.  If there are any pets around you, be aware of where they are.  Review your medicines with your doctor. Some medicines can make you feel dizzy. This can increase your chance of falling. Ask your doctor what other things that you can do to help prevent falls. This information is not intended to replace advice given to you by your health care provider. Make sure you discuss any questions you have with your health care provider. Document Released: 04/14/2009 Document Revised: 11/24/2015 Document Reviewed: 07/23/2014 Elsevier Interactive Patient Education  2017 [employer].

## 2020-06-08 ENCOUNTER — Encounter: Payer: Self-pay | Admitting: Family

## 2020-06-08 NOTE — Progress Notes (Signed)
Established Patient Office Visit  Subjective:  Patient ID: Phillip Wall, male    DOB: [birth year]  Age: 69 y.o. MRN: [id number]  CC:  Chief Complaint  Patient presents with  . Follow-up    6 month follow up, patient would like spots on chest checked. Sometimes little itchy.     HPI OTHNIEL Wall presents for recheck of HTN and Mood disorder. He reports doing well. Tolerates medications well. He has concerns of a lump on his forehead that has been there for several months. He would like a referral to plastic surgery have it removed.   Past Medical History:  Diagnosis Date  . Anxiety   . Depression   . ED (erectile dysfunction)   . History of colonic polyps   . Hypertension     Past Surgical History:  Procedure Laterality Date  . TONSILECTOMY/ADENOIDECTOMY WITH MYRINGOTOMY    . VASECTOMY  1986    Family History  Problem Relation Age of Onset  . Bladder Cancer Mother   . Heart disease Father   . Heart attack Father   . Breast cancer Paternal Grandmother   . Breast cancer Daughter   . Alcohol abuse Sister   . Hypertension Sister   . Cancer Sister   . Drug abuse Sister     Social History   Socioeconomic History  . Marital status: Married    Spouse name: Not on file  . Number of children: 3  . Years of education: Not on file  . Highest education level: Not on file  Occupational History  . Occupation: retired  Tobacco Use  . Smoking status: Never Smoker  . Smokeless tobacco: Never Used  Substance and Sexual Activity  . Alcohol use: Not Currently  . Drug use: Never  . Sexual activity: Yes  Other Topics Concern  . Not on file  Social History Narrative  . Not on file   Social Determinants of Health   Financial Resource Strain: Low Risk   . Difficulty of Paying Living Expenses: Not hard at all  Food Insecurity: No Food Insecurity  . Worried About Charity fundraiser in the Last Year: Never true  . Ran Out of Food in the Last Year: Never true   Transportation Needs: No Transportation Needs  . Lack of Transportation (Medical): No  . Lack of Transportation (Non-Medical): No  Physical Activity: Insufficiently Active  . Days of Exercise per Week: 2 days  . Minutes of Exercise per Session: 60 min  Stress: No Stress Concern Present  . Feeling of Stress : Not at all  Social Connections: Moderately Integrated  . Frequency of Communication with Friends and Family: More than three times a week  . Frequency of Social Gatherings with Friends and Family: More than three times a week  . Attends Religious Services: More than 4 times per year  . Active Member of Clubs or Organizations: No  . Attends Archivist Meetings: Never  . Marital Status: Married  Human resources officer Violence: Not At Risk  . Fear of Current or Ex-Partner: No  . Emotionally Abused: No  . Physically Abused: No  . Sexually Abused: No    Outpatient Medications Prior to Visit  Medication Sig Dispense Refill  . citalopram (CELEXA) 40 MG tablet Take 1 tablet (40 mg total) by mouth daily. 90 tablet 1  . sildenafil (REVATIO) 20 MG tablet Take 1-3 tablets 45 minutes prior... No more than 5 pills daily 50 tablet 1  . telmisartan (  MICARDIS) 80 MG tablet Take 1 tablet (80 mg total) by mouth daily. 90 tablet 1  . famotidine (PEPCID) 40 MG tablet Take one daily as needed for indigestion (Patient not taking: Reported on 06/07/2020) 90 tablet 1   No facility-administered medications prior to visit.    No Known Allergies  ROS Review of Systems  Skin:       Lump on the left forehead  All other systems reviewed and are negative.     Objective:    Physical Exam Vitals reviewed.  Constitutional:      Appearance: Normal appearance. He is normal weight.  Cardiovascular:     Rate and Rhythm: Normal rate and regular rhythm.     Pulses: Normal pulses.     Heart sounds: Normal heart sounds.  Pulmonary:     Effort: Pulmonary effort is normal.     Breath sounds:  Normal breath sounds.  Musculoskeletal:        General: Normal range of motion.     Cervical back: Normal range of motion and neck supple.  Skin:    General: Skin is warm and dry.  Neurological:     General: No focal deficit present.     Mental Status: He is alert and oriented to person, place, and time.  Psychiatric:        Mood and Affect: Mood normal.     BP 140/72   Pulse 77   Temp (!) 97.1 F (36.2 C) (Tympanic)   Ht 6' (1.829 m)   Wt 180 lb (81.6 kg)   SpO2 98%   BMI 24.41 kg/m  Wt Readings from Last 3 Encounters:  06/07/20 180 lb (81.6 kg)  06/07/20 180 lb (81.6 kg)  12/10/19 175 lb 12.8 oz (79.7 kg)     Health Maintenance Due  Topic Date Due  . TETANUS/TDAP  07/30/2019    There are no preventive care reminders to display for this patient.  Lab Results  Component Value Date   TSH 2.12 10/27/2016   Lab Results  Component Value Date   WBC 5.7 12/14/2019   HGB 13.2 12/14/2019   HCT 39.5 12/14/2019   MCV 88.0 12/14/2019   PLT 218.0 12/14/2019   Lab Results  Component Value Date   NA 134 (L) 12/14/2019   Phillip 4.0 12/14/2019   CO2 25 12/14/2019   GLUCOSE 92 12/14/2019   BUN 16 12/14/2019   CREATININE 1.25 12/14/2019   BILITOT 1.0 12/14/2019   ALKPHOS 66 12/14/2019   AST 16 12/14/2019   ALT 14 12/14/2019   PROT 7.2 12/14/2019   ALBUMIN 4.3 12/14/2019   CALCIUM 9.1 12/14/2019   GFR 69.15 12/14/2019   Lab Results  Component Value Date   CHOL 156 12/14/2019   Lab Results  Component Value Date   HDL 52.20 12/14/2019   Lab Results  Component Value Date   LDLCALC 95 12/14/2019   Lab Results  Component Value Date   TRIG 45.0 12/14/2019   Lab Results  Component Value Date   CHOLHDL 3 12/14/2019   No results found for: HGBA1C    Assessment & Plan:   Problem List Items Addressed This Visit    Mood disorder ([place])   Essential hypertension - Primary   Relevant Orders   Basic Metabolic Panel (BMET)    Other Visit Diagnoses    Dermoid  cyst of forehead       Relevant Orders   Ambulatory referral to Plastic Surgery      No  orders of the defined types were placed in this encounter.   Follow-up: Recheck in 6 months    Phillip Arnold, FNP

## 2020-07-14 ENCOUNTER — Ambulatory Visit: Payer: Medicare Other | Admitting: Plastic Surgery

## 2020-07-14 ENCOUNTER — Encounter: Payer: Self-pay | Admitting: Plastic Surgery

## 2020-07-14 ENCOUNTER — Other Ambulatory Visit: Payer: Self-pay

## 2020-07-14 VITALS — BP 146/75 | HR 70 | Ht 74.0 in | Wt 183.0 lb

## 2020-07-14 DIAGNOSIS — D170 Benign lipomatous neoplasm of skin and subcutaneous tissue of head, face and neck: Secondary | ICD-10-CM | POA: Diagnosis not present

## 2020-07-14 NOTE — Progress Notes (Signed)
   Referring Provider Libby Maw, MD [place],  [place] [address]   CC:  Chief Complaint  Patient presents with  . Advice Only      Phillip Wall is an 70 y.o. male.  HPI: Patient presents to discuss a nodule in his left forehead.  Its been present for around 10 years.  He feels like it is growing in size.  It bothers him and people ask about it a lot and asking if he is hit his head.  He also catches it and it is intermittently tender.  He is interested in having it removed.  No Known Allergies  Outpatient Encounter Medications as of 07/14/2020  Medication Sig  . citalopram (CELEXA) 40 MG tablet Take 1 tablet (40 mg total) by mouth daily.  . famotidine (PEPCID) 40 MG tablet Take one daily as needed for indigestion  . sildenafil (REVATIO) 20 MG tablet Take 1-3 tablets 45 minutes prior... No more than 5 pills daily  . telmisartan (MICARDIS) 80 MG tablet Take 1 tablet (80 mg total) by mouth daily.   No facility-administered encounter medications on file as of 07/14/2020.     Past Medical History:  Diagnosis Date  . Anxiety   . Depression   . ED (erectile dysfunction)   . History of colonic polyps   . Hypertension     Past Surgical History:  Procedure Laterality Date  . TONSILECTOMY/ADENOIDECTOMY WITH MYRINGOTOMY    . VASECTOMY  1986    Family History  Problem Relation Age of Onset  . Bladder Cancer Mother   . Heart disease Father   . Heart attack Father   . Breast cancer Paternal Grandmother   . Breast cancer Daughter   . Alcohol abuse Sister   . Hypertension Sister   . Cancer Sister   . Drug abuse Sister     Social History   Social History Narrative  . Not on file     Review of Systems General: Denies fevers, chills, weight loss CV: Denies chest pain, shortness of breath, palpitations  Physical Exam Vitals with BMI 07/14/2020 06/07/2020 06/07/2020  Height 6\' 2"  6\' 0"  6\' 0"   Weight 183 lbs 180 lbs 180 lbs  BMI 23.49 48.54  62.70  Systolic 350 093 818  Diastolic 75 72 72  Pulse 70 77 77    General:  No acute distress,  Alert and oriented, Non-Toxic, Normal speech and affect Examination shows a 2 cm mass in the left forehead.  It is mobile and soft and appears consistent with a lipoma.  There is no overlying skin changes.  Assessment/Plan Patient presents with what I expect is a lipoma in the left forehead.  We discussed excision.  We discussed the risks include bleeding, infection, damage to surrounding structures and need for additional procedures.  I discussed the location and orientation of the scar.  All of his questions were answered and he will plan to schedule this as a local procedure.  [name] 07/14/2020, 1:12 PM

## 2020-07-18 ENCOUNTER — Other Ambulatory Visit: Payer: Self-pay | Admitting: Family Medicine

## 2020-07-18 DIAGNOSIS — I10 Essential (primary) hypertension: Secondary | ICD-10-CM

## 2020-07-18 NOTE — Telephone Encounter (Signed)
Refill request for telmisartan.   Last OV was 12/10/19 and PCP wanted patient to follow up in December. No follow up appt with PCP has been completed.   Can refill with enough to get patient to next appt when they schedule? Please contact to schedule.

## 2020-10-12 ENCOUNTER — Telehealth: Payer: Self-pay

## 2020-10-12 NOTE — Telephone Encounter (Signed)
Pt called wanting to know if there was a refill on citalopram (CELEXA) 40 MG tablet  I saw one refill when the prescription was refilled in 6/21. Stated it hadn't been refilled Wants to change his pharmacy from [facility] to [facility].  Advise if he need ov or refill can be sent Thanks!

## 2020-10-13 NOTE — Telephone Encounter (Signed)
Spoke with patients wife who states that she called for the refills but patient currently not out of medications. Advised that patient need to come in for follow up on medications. Appointment scheduled.

## 2020-11-08 ENCOUNTER — Ambulatory Visit: Payer: Medicare Other | Admitting: Family Medicine

## 2020-11-23 DIAGNOSIS — Z20822 Contact with and (suspected) exposure to covid-19: Secondary | ICD-10-CM | POA: Diagnosis not present

## 2020-12-12 ENCOUNTER — Encounter: Payer: Medicare Other | Admitting: Family Medicine

## 2020-12-29 ENCOUNTER — Encounter: Payer: Medicare Other | Admitting: Family Medicine

## 2021-01-26 ENCOUNTER — Other Ambulatory Visit: Payer: Self-pay

## 2021-01-27 ENCOUNTER — Encounter: Payer: Self-pay | Admitting: Family Medicine

## 2021-01-27 ENCOUNTER — Ambulatory Visit (INDEPENDENT_AMBULATORY_CARE_PROVIDER_SITE_OTHER): Payer: Medicare Other | Admitting: Family Medicine

## 2021-01-27 VITALS — BP 136/68 | HR 77 | Temp 97.1°F | Ht 73.0 in | Wt 177.8 lb

## 2021-01-27 DIAGNOSIS — I10 Essential (primary) hypertension: Secondary | ICD-10-CM | POA: Diagnosis not present

## 2021-01-27 DIAGNOSIS — Z0000 Encounter for general adult medical examination without abnormal findings: Secondary | ICD-10-CM | POA: Diagnosis not present

## 2021-01-27 DIAGNOSIS — F39 Unspecified mood [affective] disorder: Secondary | ICD-10-CM | POA: Diagnosis not present

## 2021-01-27 LAB — LIPID PANEL
Cholesterol: 155 mg/dL (ref 0–200)
HDL: 54.1 mg/dL (ref >39.00)
LDL Cholesterol: 90 mg/dL (ref 0–99)
NonHDL: 100.88
Total CHOL/HDL Ratio: 3
Triglycerides: 53 mg/dL (ref 0.0–149.0)
VLDL: 10.6 mg/dL (ref 0.0–40.0)

## 2021-01-27 LAB — COMPREHENSIVE METABOLIC PANEL
ALT: 18 U/L (ref 0–53)
AST: 17 U/L (ref 0–37)
Albumin: 4.3 g/dL (ref 3.5–5.2)
Alkaline Phosphatase: 61 U/L (ref 39–117)
BUN: 16 mg/dL (ref 6–23)
CO2: 27 mEq/L (ref 19–32)
Calcium: 9.4 mg/dL (ref 8.4–10.5)
Chloride: 106 mEq/L (ref 96–112)
Creatinine, Ser: 1.27 mg/dL (ref 0.40–1.50)
GFR: 57.11 mL/min — ABNORMAL LOW (ref >60.00)
Glucose, Bld: 95 mg/dL (ref 70–99)
Potassium: 4.3 mEq/L (ref 3.5–5.1)
Sodium: 140 mEq/L (ref 135–145)
Total Bilirubin: 0.9 mg/dL (ref 0.2–1.2)
Total Protein: 7.1 g/dL (ref 6.0–8.3)

## 2021-01-27 LAB — CBC
HCT: 39.2 % (ref 39.0–52.0)
Hemoglobin: 13 g/dL (ref 13.0–17.0)
MCHC: 33.1 g/dL (ref 30.0–36.0)
MCV: 88.5 fl (ref 78.0–100.0)
Platelets: 204 10*3/uL (ref 150.0–400.0)
RBC: 4.43 Mil/uL (ref 4.22–5.81)
RDW: 14.8 % (ref 11.5–15.5)
WBC: 6.3 10*3/uL (ref 4.0–10.5)

## 2021-01-27 LAB — URINALYSIS, ROUTINE W REFLEX MICROSCOPIC
Bilirubin Urine: NEGATIVE
Hgb urine dipstick: NEGATIVE
Ketones, ur: NEGATIVE
Leukocytes,Ua: NEGATIVE
Nitrite: NEGATIVE
RBC / HPF: NONE SEEN (ref 0–?)
Specific Gravity, Urine: 1.02 (ref 1.000–1.030)
Total Protein, Urine: NEGATIVE
Urine Glucose: NEGATIVE
Urobilinogen, UA: 0.2 (ref 0.0–1.0)
pH: 6 (ref 5.0–8.0)

## 2021-01-27 LAB — PSA: PSA: 0.54 ng/mL (ref 0.10–4.00)

## 2021-01-27 NOTE — Progress Notes (Signed)
Established Patient Office Visit  Subjective:  Patient ID: Phillip Wall, male    DOB: [birth year]  Age: 70 y.o. MRN: [id number]  CC:  Chief Complaint  Patient presents with   Annual Exam    CPE, no concerns. Patient fasting for labs. Would like spot on chest checked.     HPI Phillip Wall presents for follow-up of hypertension and mood disorder.  Blood pressure well controlled with Micardis and he is tolerating the drug well.  He has been a little bit stressed over his 67 year old daughters breast cancer.  She is not talking about it and he and his wife for a little bit concerned.  He has been exercising by walking and going to the gym.  Past Medical History:  Diagnosis Date   Anxiety    Depression    ED (erectile dysfunction)    History of colonic polyps    Hypertension     Past Surgical History:  Procedure Laterality Date   TONSILECTOMY/ADENOIDECTOMY WITH MYRINGOTOMY     VASECTOMY  74    Family History  Problem Relation Age of Onset   Bladder Cancer Mother    Heart disease Father    Heart attack Father    Breast cancer Paternal Grandmother    Breast cancer Daughter    Alcohol abuse Sister    Hypertension Sister    Cancer Sister    Drug abuse Sister     Social History   Socioeconomic History   Marital status: Married    Spouse name: Not on file   Number of children: 3   Years of education: Not on file   Highest education level: Not on file  Occupational History   Occupation: retired  Tobacco Use   Smoking status: Never   Smokeless tobacco: Never  Vaping Use   Vaping Use: Never used  Substance and Sexual Activity   Alcohol use: Not Currently   Drug use: Never   Sexual activity: Yes  Other Topics Concern   Not on file  Social History Narrative   Not on file   Social Determinants of Health   Financial Resource Strain: Low Risk    Difficulty of Paying Living Expenses: Not hard at all  Food Insecurity: No Food Insecurity   Worried About  Charity fundraiser in the Last Year: Never true   [place] in the Last Year: Never true  Transportation Needs: No Transportation Needs   Lack of Transportation (Medical): No   Lack of Transportation (Non-Medical): No  Physical Activity: Insufficiently Active   Days of Exercise per Week: 2 days   Minutes of Exercise per Session: 60 min  Stress: No Stress Concern Present   Feeling of Stress : Not at all  Social Connections: Moderately Integrated   Frequency of Communication with Friends and Family: More than three times a week   Frequency of Social Gatherings with Friends and Family: More than three times a week   Attends Religious Services: More than 4 times per year   Active Member of [employer] or Organizations: No   Attends Archivist Meetings: Never   Marital Status: Married  Human resources officer Violence: Not At Risk   Fear of Current or Ex-Partner: No   Emotionally Abused: No   Physically Abused: No   Sexually Abused: No    Outpatient Medications Prior to Visit  Medication Sig Dispense Refill   citalopram (CELEXA) 40 MG tablet Take 1 tablet (40 mg total) by mouth  daily. 90 tablet 1   famotidine (PEPCID) 40 MG tablet Take one daily as needed for indigestion 90 tablet 1   sildenafil (REVATIO) 20 MG tablet Take 1-3 tablets 45 minutes prior... No more than 5 pills daily 50 tablet 1   telmisartan (MICARDIS) 80 MG tablet TAKE ONE TABLET BY MOUTH DAILY 90 tablet 1   No facility-administered medications prior to visit.    No Known Allergies  ROS Review of Systems  Constitutional: Negative.   HENT: Negative.    Eyes:  Negative for photophobia and visual disturbance.  Respiratory: Negative.    Cardiovascular: Negative.   Gastrointestinal: Negative.   Endocrine: Negative for polyphagia and polyuria.  Genitourinary: Negative.   Musculoskeletal:  Negative for gait problem and joint swelling.  Neurological:  Negative for weakness and headaches.     Depression screen  [facility] 2/9 01/27/2021 01/27/2021 06/07/2020  Decreased Interest 0 0 0  Down, Depressed, Hopeless 0 1 0  PHQ - 2 Score 0 1 0  Altered sleeping 1 - -  Tired, decreased energy 0 - -  Change in appetite 0 - -  Feeling bad or failure about yourself  1 - -  Trouble concentrating 0 - -  Moving slowly or fidgety/restless 0 - -  Suicidal thoughts 0 - -  PHQ-9 Score 2 - -  Difficult doing work/chores Not difficult at all - -     Objective:    Physical Exam Vitals and nursing note reviewed.  Constitutional:      General: He is not in acute distress.    Appearance: Normal appearance. He is normal weight. He is not ill-appearing, toxic-appearing or diaphoretic.  HENT:     Head: Normocephalic and atraumatic.     Right Ear: Tympanic membrane, ear canal and external ear normal.     Left Ear: Tympanic membrane, ear canal and external ear normal.     Mouth/Throat:     Mouth: Mucous membranes are moist.     Pharynx: Oropharynx is clear. No oropharyngeal exudate or posterior oropharyngeal erythema.  Eyes:     General: No scleral icterus.       Right eye: No discharge.        Left eye: No discharge.     Extraocular Movements: Extraocular movements intact.     Conjunctiva/sclera: Conjunctivae normal.     Pupils: Pupils are equal, round, and reactive to light.  Neck:     Vascular: No carotid bruit.  Cardiovascular:     Rate and Rhythm: Normal rate and regular rhythm.  Pulmonary:     Effort: Pulmonary effort is normal.     Breath sounds: Normal breath sounds.  Abdominal:     General: Bowel sounds are normal. There is no distension.     Palpations: There is no mass.     Tenderness: There is no abdominal tenderness. There is no guarding or rebound.     Hernia: No hernia is present. There is no hernia in the left inguinal area or right inguinal area.  Genitourinary:    Penis: Circumcised. No hypospadias, erythema, tenderness, discharge, swelling or lesions.      Testes:        Right: Mass,  tenderness or swelling not present. Right testis is descended.        Left: Mass, tenderness or swelling not present. Left testis is descended.     Epididymis:     Right: Not inflamed or enlarged.     Left: Not inflamed or enlarged.  Prostate: Not enlarged, not tender and no nodules present.     Rectum: Guaiac result negative. No mass, tenderness, anal fissure, external hemorrhoid or internal hemorrhoid. Normal anal tone.  Musculoskeletal:     Cervical back: No rigidity or tenderness.  Lymphadenopathy:     Cervical: No cervical adenopathy.     Lower Body: No right inguinal adenopathy. No left inguinal adenopathy.  Skin:    General: Skin is warm and dry.  Neurological:     Mental Status: He is alert and oriented to person, place, and time.  Psychiatric:        Mood and Affect: Mood normal.        Behavior: Behavior normal.    BP 136/68 (BP Location: Right Arm, Patient Position: Sitting, Cuff Size: Normal)   Pulse 77   Temp (!) 97.1 F (36.2 C) (Temporal)   Ht '6\' 1"'$  (1.854 m)   Wt 177 lb 12.8 oz (80.6 kg)   SpO2 98%   BMI 23.46 kg/m  Wt Readings from Last 3 Encounters:  01/27/21 177 lb 12.8 oz (80.6 kg)  07/14/20 183 lb (83 kg)  06/07/20 180 lb (81.6 kg)     Health Maintenance Due  Topic Date Due   Zoster Vaccines- Shingrix (1 of 2) Never done   PNA vac Low Risk Adult (2 of 2 - PCV13) 12/09/2020    There are no preventive care reminders to display for this patient.  Lab Results  Component Value Date   TSH 2.12 10/27/2016   Lab Results  Component Value Date   WBC 5.7 12/14/2019   HGB 13.2 12/14/2019   HCT 39.5 12/14/2019   MCV 88.0 12/14/2019   PLT 218.0 12/14/2019   Lab Results  Component Value Date   NA 134 (L) 12/14/2019   K 4.0 12/14/2019   CO2 25 12/14/2019   GLUCOSE 92 12/14/2019   BUN 16 12/14/2019   CREATININE 1.25 12/14/2019   BILITOT 1.0 12/14/2019   ALKPHOS 66 12/14/2019   AST 16 12/14/2019   ALT 14 12/14/2019   PROT 7.2 12/14/2019    ALBUMIN 4.3 12/14/2019   CALCIUM 9.1 12/14/2019   GFR 69.15 12/14/2019   Lab Results  Component Value Date   CHOL 156 12/14/2019   Lab Results  Component Value Date   HDL 52.20 12/14/2019   Lab Results  Component Value Date   LDLCALC 95 12/14/2019   Lab Results  Component Value Date   TRIG 45.0 12/14/2019   Lab Results  Component Value Date   CHOLHDL 3 12/14/2019   No results found for: HGBA1C    Assessment & Plan:   Problem List Items Addressed This Visit       Cardiovascular and Mediastinum   Essential hypertension - Primary   Relevant Orders   CBC   Comprehensive metabolic panel   Urinalysis, Routine w reflex microscopic     Other   Mood disorder ([place])   Healthcare maintenance   Relevant Orders   Lipid panel   PSA    No orders of the defined types were placed in this encounter.   Follow-up: Return in about 6 months (around 07/30/2021).   Given information on managing hypertension as well as health maintenance and preventative care for those over 65.  We had a long discussion about things that we can control and things that we cannot. Libby Maw, MD

## 2021-01-30 ENCOUNTER — Encounter: Payer: Self-pay | Admitting: Family Medicine

## 2021-01-30 ENCOUNTER — Telehealth: Payer: Self-pay | Admitting: Family Medicine

## 2021-01-30 NOTE — Telephone Encounter (Signed)
Pt is wanting a cb concerning his most recent lab results. Please advise at [phone].

## 2021-01-30 NOTE — Telephone Encounter (Signed)
Returned patient call no answer LM with normal labs on VM and also sent message via Mychart

## 2021-02-01 ENCOUNTER — Telehealth: Payer: Self-pay | Admitting: Family Medicine

## 2021-02-01 DIAGNOSIS — K219 Gastro-esophageal reflux disease without esophagitis: Secondary | ICD-10-CM

## 2021-02-01 DIAGNOSIS — F39 Unspecified mood [affective] disorder: Secondary | ICD-10-CM

## 2021-02-01 NOTE — Telephone Encounter (Signed)
Refill request for pending Rx last OV 01/27/21. Please advise

## 2021-02-01 NOTE — Telephone Encounter (Signed)
Meds not sent in following OV 01/27/21  citalopram (CELEXA) 40 MG tablet famotidine (PEPCID) 40 MG tablet  Please send to  [facility] [id number] - [place], [place] - [address] Phone:  [phone]  Fax:  [phone]

## 2021-02-02 MED ORDER — FAMOTIDINE 40 MG PO TABS: ORAL_TABLET | ORAL | 1 refills | Status: DC

## 2021-02-03 NOTE — Telephone Encounter (Signed)
Refill request for Celexa '40mg'$ . Please advise.

## 2021-02-15 ENCOUNTER — Emergency Department (HOSPITAL_BASED_OUTPATIENT_CLINIC_OR_DEPARTMENT_OTHER): Payer: Medicare Other

## 2021-02-15 ENCOUNTER — Encounter: Payer: Self-pay | Admitting: Family Medicine

## 2021-02-15 ENCOUNTER — Encounter (HOSPITAL_BASED_OUTPATIENT_CLINIC_OR_DEPARTMENT_OTHER): Payer: Self-pay

## 2021-02-15 ENCOUNTER — Emergency Department (HOSPITAL_BASED_OUTPATIENT_CLINIC_OR_DEPARTMENT_OTHER)
Admission: EM | Admit: 2021-02-15 | Discharge: 2021-02-15 | Disposition: A | Discharge status: Home / Self Care | Payer: Medicare Other | Attending: Emergency Medicine | Admitting: Emergency Medicine

## 2021-02-15 ENCOUNTER — Other Ambulatory Visit: Payer: Self-pay

## 2021-02-15 DIAGNOSIS — R079 Chest pain, unspecified: Secondary | ICD-10-CM | POA: Diagnosis not present

## 2021-02-15 DIAGNOSIS — R0789 Other chest pain: Secondary | ICD-10-CM | POA: Insufficient documentation

## 2021-02-15 DIAGNOSIS — Z79899 Other long term (current) drug therapy: Secondary | ICD-10-CM | POA: Insufficient documentation

## 2021-02-15 DIAGNOSIS — M549 Dorsalgia, unspecified: Secondary | ICD-10-CM | POA: Diagnosis not present

## 2021-02-15 DIAGNOSIS — I10 Essential (primary) hypertension: Secondary | ICD-10-CM | POA: Insufficient documentation

## 2021-02-15 DIAGNOSIS — F39 Unspecified mood [affective] disorder: Secondary | ICD-10-CM

## 2021-02-15 DIAGNOSIS — M7918 Myalgia, other site: Secondary | ICD-10-CM

## 2021-02-15 LAB — BASIC METABOLIC PANEL
Anion gap: 9 (ref 5–15)
BUN: 16 mg/dL (ref 8–23)
CO2: 26 mmol/L (ref 22–32)
Calcium: 9 mg/dL (ref 8.9–10.3)
Chloride: 100 mmol/L (ref 98–111)
Creatinine, Ser: 1.39 mg/dL — ABNORMAL HIGH (ref 0.61–1.24)
GFR, Estimated: 55 mL/min — ABNORMAL LOW (ref >60)
Glucose, Bld: 120 mg/dL — ABNORMAL HIGH (ref 70–99)
Potassium: 3.6 mmol/L (ref 3.5–5.1)
Sodium: 135 mmol/L (ref 135–145)

## 2021-02-15 LAB — TROPONIN I (HIGH SENSITIVITY)
Troponin I (High Sensitivity): 4 ng/L (ref <18)
Troponin I (High Sensitivity): 4 ng/L (ref <18)

## 2021-02-15 LAB — CBC
HCT: 36.4 % — ABNORMAL LOW (ref 39.0–52.0)
Hemoglobin: 12.6 g/dL — ABNORMAL LOW (ref 13.0–17.0)
MCH: 29.2 pg (ref 26.0–34.0)
MCHC: 34.6 g/dL (ref 30.0–36.0)
MCV: 84.5 fL (ref 80.0–100.0)
Platelets: 234 10*3/uL (ref 150–400)
RBC: 4.31 MIL/uL (ref 4.22–5.81)
RDW: 14 % (ref 11.5–15.5)
WBC: 13.4 10*3/uL — ABNORMAL HIGH (ref 4.0–10.5)
nRBC: 0 % (ref 0.0–0.2)

## 2021-02-15 MED ORDER — LIDOCAINE 5 % EX PTCH: 1.0000 | MEDICATED_PATCH | CUTANEOUS | 0 refills | Status: DC

## 2021-02-15 MED ORDER — LIDOCAINE 5 % EX PTCH
1.0000 | MEDICATED_PATCH | Freq: Once | CUTANEOUS | Status: DC
  Administered 2021-02-15: 1 via TRANSDERMAL
  Filled 2021-02-15: qty 1

## 2021-02-15 MED ORDER — CYCLOBENZAPRINE HCL 5 MG PO TABS
5.0000 mg | ORAL_TABLET | Freq: Once | ORAL | Status: AC
  Administered 2021-02-15: 5 mg via ORAL
  Filled 2021-02-15: qty 1

## 2021-02-15 MED ORDER — CYCLOBENZAPRINE HCL 5 MG PO TABS: 5.0000 mg | ORAL_TABLET | Freq: Three times a day (TID) | ORAL | 0 refills | Status: AC | PRN

## 2021-02-15 NOTE — Discharge Instructions (Addendum)
Dear Phillip Wall,  Thank you for trusting [place] with your care today.   We evaluated you for cardiac as well as long causes for your chest pain. We checked your EKG which showed no acute changes. We also checked your heart enzymes looking for any damage. Your heart enzymes stayed within normal range which reassured [place] that there was no concerning problem with your heart. We also obtained a chest x-ray which showed no cardiopulmonary disease. This in addition to your overall appearance reassured [place] that there was no problem with your lungs.  Your description of the pain and the surrounding events makes [place] think that the pain is most likely musculoskeletal in origin. For this reason, we feel that you are safe to discharge home. We will give you prescription of flexeril as well as Lidoderm patches to help with the pain. Please take the flexeril no more than three times daily as needed for muscle cramping/spasms. Please apply the Lidoderm patch to the affected area.   Please follow up with your primary care physician in 1 week.

## 2021-02-15 NOTE — ED Triage Notes (Signed)
Pt c/o CP that radiates to back x 3 days-pain worse when he takes a deep breath-NAD-steady gait

## 2021-02-15 NOTE — ED Provider Notes (Signed)
[facility] EMERGENCY DEPARTMENT Provider Note   CSN: [id number] Arrival date & time: 02/15/21  1408     History Chief Complaint  Patient presents with   Chest Pain    Phillip Wall is a 70 y.o. male.  The history is provided by the patient. No language interpreter was used.  Chest Pain Pain quality: aching   Pain radiates to:  Does not radiate Pain severity:  Moderate Onset quality:  Sudden Duration:  2 days Timing:  Sporadic Progression:  Resolved Chronicity:  New Context: breathing, lifting and movement   Relieved by:  Rest Ineffective treatments:  None tried Associated symptoms: back pain   Associated symptoms: no cough, no diaphoresis, no dizziness, no nausea, no palpitations, no shortness of breath and no vomiting   Risk factors: male sex    HPI: A 70 year old patient with a history of hypertension presents for evaluation of chest pain. Initial onset of pain was approximately 1-3 hours ago. The patient's chest pain is well-localized, is described as heaviness/pressure/tightness and is not worse with exertion. The patient's chest pain is middle- or left-sided, is not sharp and does not radiate to the arms/jaw/neck. The patient does not complain of nausea and denies diaphoresis. The patient has no history of stroke, has no history of peripheral artery disease, has not smoked in the past 90 days, denies any history of treated diabetes, has no relevant family history of coronary artery disease (first degree relative at less than age 21), has no history of hypercholesterolemia and does not have an elevated BMI (>=30).   Past Medical History:  Diagnosis Date   Anxiety    Depression    ED (erectile dysfunction)    History of colonic polyps    Hypertension     Patient Active Problem List   Diagnosis Date Noted   Need for pneumococcal vaccination 12/10/2019   Erectile dysfunction due to arterial insufficiency 03/12/2019   Gastroesophageal reflux disease  03/12/2019   Need for influenza vaccination 03/12/2019   Inclusion cyst 09/01/2018   Healthcare maintenance 09/01/2018   Mood disorder ([place]) 12/16/2017   Essential hypertension 12/16/2017    Past Surgical History:  Procedure Laterality Date   TONSILECTOMY/ADENOIDECTOMY WITH MYRINGOTOMY     VASECTOMY  1986       Family History  Problem Relation Age of Onset   Bladder Cancer Mother    Heart disease Father    Heart attack Father    Breast cancer Paternal 86    Breast cancer Daughter    Alcohol abuse Sister    Hypertension Sister    Cancer Sister    Drug abuse Sister     Social History   Tobacco Use   Smoking status: Never   Smokeless tobacco: Never  Vaping Use   Vaping Use: Never used  Substance Use Topics   Alcohol use: Not Currently   Drug use: Never    Home Medications Prior to Admission medications   Medication Sig Start Date End Date Taking? Authorizing Provider  cyclobenzaprine (FLEXERIL) 5 MG tablet Take 1 tablet (5 mg total) by mouth 3 (three) times daily as needed for up to 14 days for muscle spasms. 02/15/21 03/01/21 Yes Delene Ruffini, MD  lidocaine (LIDODERM) 5 % Place 1 patch onto the skin daily. Remove & Discard patch within 12 hours or as directed by MD 02/15/21  Yes Delene Ruffini, MD  citalopram (CELEXA) 40 MG tablet Take 1 tablet (40 mg total) by mouth daily. 02/16/21   Ethelene Hal,  Mortimer Fries, MD  famotidine (PEPCID) 40 MG tablet Take one daily as needed for indigestion 02/02/21   Libby Maw, MD  sildenafil (REVATIO) 20 MG tablet Take 1-3 tablets 45 minutes prior... No more than 5 pills daily 03/20/19   Libby Maw, MD  telmisartan (MICARDIS) 80 MG tablet TAKE ONE TABLET BY MOUTH DAILY 07/19/20   Kennyth Arnold, FNP    Allergies    Patient has no known allergies.  Review of Systems   Review of Systems  Constitutional:  Negative for diaphoresis.  Respiratory:  Negative for cough, chest tightness and shortness of  breath.   Cardiovascular:  Positive for chest pain. Negative for palpitations.  Gastrointestinal:  Negative for nausea and vomiting.  Musculoskeletal:  Positive for back pain.  Neurological:  Negative for dizziness.   Physical Exam Updated Vital Signs BP (!) 156/87   Pulse 95   Temp 98.4 F (36.9 C) (Oral)   Resp (!) 21 Comment: in pain  Ht '6\' 1"'$  (1.854 m)   Wt 80.3 kg   SpO2 98%   BMI 23.35 kg/m   Physical Exam Constitutional:      Appearance: He is well-developed and normal weight.  HENT:     Head: Normocephalic and atraumatic.  Cardiovascular:     Rate and Rhythm: Normal rate and regular rhythm.     Heart sounds: Normal heart sounds.  Pulmonary:     Effort: Pulmonary effort is normal.     Breath sounds: Normal breath sounds.  Chest:     Chest wall: No deformity or tenderness.  Abdominal:     General: Bowel sounds are normal.     Palpations: Abdomen is soft.  Musculoskeletal:     Right lower leg: No tenderness. No edema.     Left lower leg: No tenderness. No edema.  Skin:    General: Skin is warm and dry.  Neurological:     General: No focal deficit present.     Mental Status: He is alert and oriented to person, place, and time.  Psychiatric:        Mood and Affect: Mood normal.        Behavior: Behavior normal.    ED Results / Procedures / Treatments   Labs (all labs ordered are listed, but only abnormal results are displayed) Labs Reviewed  BASIC METABOLIC PANEL - Abnormal; Notable for the following components:      Result Value   Glucose, Bld 120 (*)    Creatinine, Ser 1.39 (*)    GFR, Estimated 55 (*)    All other components within normal limits  CBC - Abnormal; Notable for the following components:   WBC 13.4 (*)    Hemoglobin 12.6 (*)    HCT 36.4 (*)    All other components within normal limits  TROPONIN I (HIGH SENSITIVITY)  TROPONIN I (HIGH SENSITIVITY)    EKG EKG Interpretation  Date/Time:  Wednesday February 15 2021 14:18:48  EDT Ventricular Rate:  89 PR Interval:  146 QRS Duration: 96 QT Interval:  352 QTC Calculation: 428 R Axis:   70 Text Interpretation: Sinus rhythm with Premature atrial complexes Incomplete right bundle branch block Borderline ECG No prior ECG for comparison. No STEMI Confirmed by Antony Blackbird [phone]) on 02/15/2021 4:50:04 PM  Radiology DG Chest 2 View  Result Date: 02/15/2021 CLINICAL DATA:  Chest pain EXAM: CHEST - 2 VIEW COMPARISON:  08/21/2005 FINDINGS: Linear scarring or atelectasis left base. No focal opacity, pleural effusion or pneumothorax.  Normal cardiomediastinal silhouette. IMPRESSION: No active cardiopulmonary disease. Minimal scarring or atelectasis left base Electronically Signed   By: Donavan Foil M.D.   On: 02/15/2021 15:10    Procedures Procedures   Medications Ordered in ED Medications  cyclobenzaprine (FLEXERIL) tablet 5 mg (5 mg Oral Given 02/15/21 1829)    ED Course  I have reviewed the triage vital signs and the nursing notes.  Pertinent labs & imaging results that were available during my care of the patient were reviewed by me and considered in my medical decision making (see chart for details).    MDM Rules/Calculators/A&P HEAR Score: 3                         Patient is a 70 year old male who presents for evaluation of chest pain that onset Friday evening 8/12. He reports that the pain began while he was doing vigorous yard work and was a dull ache in his central chest. It did not radiate. He did not have any diaphoresis, nausea, vomiting, dizziness or lightheadedness at the time. He reports that rest helps but that afterwards he noted that he felt stiff. EKG showing past incomplete bundle branch block, but no acute changes. Troponin has been negative X2.   On 'Sunday, 8/14, he noted that his chest pain had resolved, but he had developed a new pain in his back. Had initially planned to be evaluated by his PCP tomorrow 8/18, but could not get appointment and  was told to come into ED for evaluation. He describes the pain as sharp. It is worse with movement and deep breaths. He has taken tylenol for it which he reports helps some. Pain predominantly in paraspinal muscles along left side. Pain is sharp but does not radiate anywhere. No tenderness to palpation. No erythema or edema noted.   Troponins and Ekg appear stable. This pain is unlikely cardiac in origin.  Furthermore, patient has not have any fever, no cough, and no SOB so CP unlikely resulting from any underlying PNA. Cr slightly high, recommend he improve hydration He will be discharged home with prescriptions of Lidoderm and flexeril He will need to follow up with his PCP. Final Clinical Impression(s) / ED Diagnoses Final diagnoses:  Chest wall pain  Musculoskeletal pain    Rx / DC Orders ED Discharge Orders          Ordered    cyclobenzaprine (FLEXERIL) 5 MG tablet  3 times daily PRN        02/15/21 1810    lidocaine (LIDODERM) 5 %  Every 24 hours,   Status:  Discontinued        02/15/21 1810    lidocaine (LIDODERM) 5 %  Every 24 hours        08'$ /17/22 1812             Delene Ruffini, MD 02/16/21 1518    Tegeler, Gwenyth Allegra, MD 02/16/21 [phone]

## 2021-02-16 ENCOUNTER — Ambulatory Visit: Payer: Medicare Other | Admitting: Family Medicine

## 2021-02-16 MED ORDER — CITALOPRAM HYDROBROMIDE 40 MG PO TABS: 40.0000 mg | ORAL_TABLET | Freq: Every day | ORAL | 1 refills | Status: DC

## 2021-02-17 ENCOUNTER — Telehealth: Payer: Self-pay | Admitting: Family Medicine

## 2021-02-17 NOTE — Telephone Encounter (Signed)
Pt wife called requesting pain medication for him. He is in a lot of pain, he was recently in the ER and has an appt 8/25

## 2021-02-19 ENCOUNTER — Inpatient Hospital Stay (HOSPITAL_COMMUNITY)
Admission: EM | Admit: 2021-02-19 | Discharge: 2021-02-21 | DRG: 176 | Disposition: A | Discharge status: Home / Self Care | Payer: Medicare Other | Attending: Internal Medicine | Admitting: Internal Medicine

## 2021-02-19 ENCOUNTER — Emergency Department (HOSPITAL_COMMUNITY): Payer: Medicare Other

## 2021-02-19 ENCOUNTER — Other Ambulatory Visit: Payer: Self-pay

## 2021-02-19 ENCOUNTER — Encounter (HOSPITAL_COMMUNITY): Payer: Self-pay | Admitting: Emergency Medicine

## 2021-02-19 DIAGNOSIS — N289 Disorder of kidney and ureter, unspecified: Secondary | ICD-10-CM

## 2021-02-19 DIAGNOSIS — I2699 Other pulmonary embolism without acute cor pulmonale: Secondary | ICD-10-CM | POA: Diagnosis present

## 2021-02-19 DIAGNOSIS — K219 Gastro-esophageal reflux disease without esophagitis: Secondary | ICD-10-CM | POA: Diagnosis present

## 2021-02-19 DIAGNOSIS — D649 Anemia, unspecified: Secondary | ICD-10-CM | POA: Diagnosis not present

## 2021-02-19 DIAGNOSIS — Z8249 Family history of ischemic heart disease and other diseases of the circulatory system: Secondary | ICD-10-CM | POA: Diagnosis not present

## 2021-02-19 DIAGNOSIS — D72829 Elevated white blood cell count, unspecified: Secondary | ICD-10-CM | POA: Diagnosis not present

## 2021-02-19 DIAGNOSIS — R0902 Hypoxemia: Secondary | ICD-10-CM | POA: Diagnosis not present

## 2021-02-19 DIAGNOSIS — F32A Depression, unspecified: Secondary | ICD-10-CM | POA: Diagnosis not present

## 2021-02-19 DIAGNOSIS — Z79899 Other long term (current) drug therapy: Secondary | ICD-10-CM

## 2021-02-19 DIAGNOSIS — Z20822 Contact with and (suspected) exposure to covid-19: Secondary | ICD-10-CM | POA: Diagnosis not present

## 2021-02-19 DIAGNOSIS — I10 Essential (primary) hypertension: Secondary | ICD-10-CM | POA: Diagnosis not present

## 2021-02-19 DIAGNOSIS — F419 Anxiety disorder, unspecified: Secondary | ICD-10-CM | POA: Diagnosis present

## 2021-02-19 DIAGNOSIS — Z8616 Personal history of COVID-19: Secondary | ICD-10-CM | POA: Diagnosis not present

## 2021-02-19 DIAGNOSIS — R0602 Shortness of breath: Secondary | ICD-10-CM | POA: Diagnosis not present

## 2021-02-19 DIAGNOSIS — I2694 Multiple subsegmental pulmonary emboli without acute cor pulmonale: Secondary | ICD-10-CM | POA: Diagnosis not present

## 2021-02-19 DIAGNOSIS — R079 Chest pain, unspecified: Secondary | ICD-10-CM | POA: Diagnosis not present

## 2021-02-19 DIAGNOSIS — J90 Pleural effusion, not elsewhere classified: Secondary | ICD-10-CM | POA: Diagnosis not present

## 2021-02-19 DIAGNOSIS — N179 Acute kidney failure, unspecified: Secondary | ICD-10-CM | POA: Diagnosis present

## 2021-02-19 DIAGNOSIS — R000 Tachycardia, unspecified: Secondary | ICD-10-CM | POA: Diagnosis not present

## 2021-02-19 LAB — BRAIN NATRIURETIC PEPTIDE: B Natriuretic Peptide: 25.5 pg/mL (ref 0.0–100.0)

## 2021-02-19 LAB — URINALYSIS, ROUTINE W REFLEX MICROSCOPIC
Bilirubin Urine: NEGATIVE
Glucose, UA: NEGATIVE mg/dL
Ketones, ur: 5 mg/dL — AB
Leukocytes,Ua: NEGATIVE
Nitrite: NEGATIVE
Protein, ur: NEGATIVE mg/dL
Specific Gravity, Urine: 1.01 (ref 1.005–1.030)
pH: 6 (ref 5.0–8.0)

## 2021-02-19 LAB — CBC WITH DIFFERENTIAL/PLATELET
Abs Immature Granulocytes: 0.06 10*3/uL (ref 0.00–0.07)
Basophils Absolute: 0 10*3/uL (ref 0.0–0.1)
Basophils Relative: 0 %
Eosinophils Absolute: 0.1 10*3/uL (ref 0.0–0.5)
Eosinophils Relative: 1 %
HCT: 35 % — ABNORMAL LOW (ref 39.0–52.0)
Hemoglobin: 12.1 g/dL — ABNORMAL LOW (ref 13.0–17.0)
Immature Granulocytes: 1 %
Lymphocytes Relative: 8 %
Lymphs Abs: 1 10*3/uL (ref 0.7–4.0)
MCH: 29.2 pg (ref 26.0–34.0)
MCHC: 34.6 g/dL (ref 30.0–36.0)
MCV: 84.3 fL (ref 80.0–100.0)
Monocytes Absolute: 1.3 10*3/uL — ABNORMAL HIGH (ref 0.1–1.0)
Monocytes Relative: 10 %
Neutro Abs: 10.5 10*3/uL — ABNORMAL HIGH (ref 1.7–7.7)
Neutrophils Relative %: 80 %
Platelets: 289 10*3/uL (ref 150–400)
RBC: 4.15 MIL/uL — ABNORMAL LOW (ref 4.22–5.81)
RDW: 13.8 % (ref 11.5–15.5)
WBC: 13 10*3/uL — ABNORMAL HIGH (ref 4.0–10.5)
nRBC: 0 % (ref 0.0–0.2)

## 2021-02-19 LAB — COMPREHENSIVE METABOLIC PANEL
ALT: 21 U/L (ref 0–44)
AST: 19 U/L (ref 15–41)
Albumin: 3.1 g/dL — ABNORMAL LOW (ref 3.5–5.0)
Alkaline Phosphatase: 81 U/L (ref 38–126)
Anion gap: 9 (ref 5–15)
BUN: 11 mg/dL (ref 8–23)
CO2: 26 mmol/L (ref 22–32)
Calcium: 9.1 mg/dL (ref 8.9–10.3)
Chloride: 101 mmol/L (ref 98–111)
Creatinine, Ser: 1.51 mg/dL — ABNORMAL HIGH (ref 0.61–1.24)
GFR, Estimated: 49 mL/min — ABNORMAL LOW (ref >60)
Glucose, Bld: 96 mg/dL (ref 70–99)
Potassium: 3.8 mmol/L (ref 3.5–5.1)
Sodium: 136 mmol/L (ref 135–145)
Total Bilirubin: 0.9 mg/dL (ref 0.3–1.2)
Total Protein: 7.5 g/dL (ref 6.5–8.1)

## 2021-02-19 LAB — TROPONIN I (HIGH SENSITIVITY)
Troponin I (High Sensitivity): 10 ng/L (ref <18)
Troponin I (High Sensitivity): 14 ng/L (ref <18)

## 2021-02-19 LAB — SURGICAL PCR SCREEN
MRSA, PCR: NEGATIVE
Staphylococcus aureus: NEGATIVE

## 2021-02-19 LAB — LIPASE, BLOOD: Lipase: 17 U/L (ref 11–51)

## 2021-02-19 LAB — HEPARIN LEVEL (UNFRACTIONATED): Heparin Unfractionated: <0.1 IU/mL — ABNORMAL LOW (ref 0.30–0.70)

## 2021-02-19 LAB — PROTIME-INR
INR: 1.1 (ref 0.8–1.2)
Prothrombin Time: 14.1 seconds (ref 11.4–15.2)

## 2021-02-19 LAB — SARS CORONAVIRUS 2 (TAT 6-24 HRS): SARS Coronavirus 2: NEGATIVE

## 2021-02-19 LAB — ANTITHROMBIN III: AntiThromb III Func: 98 % (ref 75–120)

## 2021-02-19 LAB — HIV ANTIBODY (ROUTINE TESTING W REFLEX): HIV Screen 4th Generation wRfx: NONREACTIVE

## 2021-02-19 MED ORDER — HEPARIN BOLUS VIA INFUSION
2000.0000 [IU] | Freq: Once | INTRAVENOUS | Status: AC
  Administered 2021-02-19: 2000 [IU] via INTRAVENOUS
  Filled 2021-02-19: qty 2000

## 2021-02-19 MED ORDER — ACETAMINOPHEN 325 MG PO TABS: 650.0000 mg | ORAL_TABLET | Freq: Four times a day (QID) | ORAL | Status: DC | PRN

## 2021-02-19 MED ORDER — HEPARIN (PORCINE) 25000 UT/250ML-% IV SOLN
1600.0000 [IU]/h | INTRAVENOUS | Status: DC
  Administered 2021-02-19: 1300 [IU]/h via INTRAVENOUS
  Administered 2021-02-19: 1600 [IU]/h via INTRAVENOUS
  Filled 2021-02-19 (×2): qty 250

## 2021-02-19 MED ORDER — HYDROMORPHONE HCL 1 MG/ML IJ SOLN: 0.5000 mg | INTRAMUSCULAR | Status: DC | PRN

## 2021-02-19 MED ORDER — FAMOTIDINE 20 MG PO TABS: 40.0000 mg | ORAL_TABLET | Freq: Every day | ORAL | Status: DC | PRN

## 2021-02-19 MED ORDER — LORAZEPAM 2 MG/ML IJ SOLN
1.0000 mg | Freq: Once | INTRAMUSCULAR | Status: AC
  Administered 2021-02-19: 1 mg via INTRAVENOUS
  Filled 2021-02-19: qty 1

## 2021-02-19 MED ORDER — HYDROCODONE-ACETAMINOPHEN 5-325 MG PO TABS: 1.0000 | ORAL_TABLET | ORAL | Status: DC | PRN

## 2021-02-19 MED ORDER — ACETAMINOPHEN 650 MG RE SUPP: 650.0000 mg | Freq: Four times a day (QID) | RECTAL | Status: DC | PRN

## 2021-02-19 MED ORDER — IOHEXOL 350 MG/ML SOLN
75.0000 mL | Freq: Once | INTRAVENOUS | Status: AC | PRN
  Administered 2021-02-19: 75 mL via INTRAVENOUS

## 2021-02-19 MED ORDER — CYCLOBENZAPRINE HCL 10 MG PO TABS
5.0000 mg | ORAL_TABLET | Freq: Three times a day (TID) | ORAL | Status: DC | PRN
  Administered 2021-02-19 – 2021-02-20 (×3): 5 mg via ORAL
  Filled 2021-02-19 (×3): qty 1

## 2021-02-19 MED ORDER — ALBUTEROL SULFATE (2.5 MG/3ML) 0.083% IN NEBU
2.5000 mg | INHALATION_SOLUTION | Freq: Four times a day (QID) | RESPIRATORY_TRACT | Status: DC | PRN
Start: 2021-02-19 — End: 2021-02-21

## 2021-02-19 MED ORDER — IRBESARTAN 300 MG PO TABS
300.0000 mg | ORAL_TABLET | Freq: Every day | ORAL | Status: DC
  Filled 2021-02-19: qty 1

## 2021-02-19 MED ORDER — HYDROMORPHONE HCL 1 MG/ML IJ SOLN
0.5000 mg | Freq: Once | INTRAMUSCULAR | Status: AC
  Administered 2021-02-19: 0.5 mg via INTRAVENOUS
  Filled 2021-02-19: qty 1

## 2021-02-19 MED ORDER — SODIUM CHLORIDE 0.9% FLUSH
3.0000 mL | Freq: Two times a day (BID) | INTRAVENOUS | Status: DC
  Administered 2021-02-19 – 2021-02-20 (×3): 3 mL via INTRAVENOUS

## 2021-02-19 MED ORDER — HYDROCODONE-ACETAMINOPHEN 7.5-325 MG/15ML PO SOLN
10.0000 mL | Freq: Four times a day (QID) | ORAL | Status: DC | PRN
Start: 2021-02-19 — End: 2021-02-21
  Administered 2021-02-19 – 2021-02-20 (×5): 10 mL via ORAL
  Filled 2021-02-19 (×5): qty 15

## 2021-02-19 MED ORDER — HEPARIN BOLUS VIA INFUSION
5000.0000 [IU] | Freq: Once | INTRAVENOUS | Status: AC
  Administered 2021-02-19: 5000 [IU] via INTRAVENOUS
  Filled 2021-02-19: qty 5000

## 2021-02-19 MED ORDER — SODIUM CHLORIDE 0.9 % IV SOLN: Freq: Once | INTRAVENOUS | Status: AC

## 2021-02-19 NOTE — H&P (Addendum)
History and Physical    Phillip Wall [id number] DOB: 04-18-1951 DOA: 02/19/2021  Referring MD/NP/PA: Charlesetta Shanks, MD PCP: Libby Maw, MD  Patient coming from: home  Chief Complaint: Chest pain  I have personally briefly reviewed patient's old medical records in Phillip Wall   HPI: Phillip Wall is a 70 y.o. male with medical history significant of hypertension, anxiety, depression, GERD, and history of COVID in 10/2020 of this year who presents with complaints of chest pain which started 4 days ago.  He admitted the night before pain started he had the feeling of knots in his legs that shortly went away.  Describes the pain in his chest as sharp and pressure-like with radiation to his back.  Associated symptoms include complaints of shortness of breath.  Pain is worse when trying to take a deep breath and with activity.  The first day symptoms started patient had been evaluated in the emergency department with chest x-ray and high-sensitivity troponins which showed no acute abnormalities.  At that time symptoms were thought to be musculoskeletal in nature and patient was sent home with lidocaine and Flexeril.  Since being home patient reports that he had been unable to sleep or lay flat at night due to his symptoms.  Despite taking anti-inflammatories, Flexeril, and using the lidocaine patches he had no relief in symptoms.  Patient denies any fever, cough, recent travel, prolonged immobilization, use of testosterone injections, known malignancy, or history of blood clots/family history of blood clots.  Patient admits to having COVID-19 in the end of May of this year, but recovered.  ED Course: Upon admission patient was noted to be afebrile, pulse 93-1 19, respirations 14-37, blood pressures 144/71-165/74, and O2 saturations currently maintained room air.  Labs significant for WBC 13, hemoglobin 12.1, BUN 11, creatinine 1.51, and INR 1.1.  Due to patient's symptoms a CT  angiogram of the chest was obtained and revealed bilateral subsegmental pulmonary embolus worse on the right with signs of right heart strain and possible pulmonary infarction.  Patient has been given Ativan, Dilaudid IV, #IV fluids, and started on heparin drip per pharmacy.    Review of Systems  Constitutional:  Positive for malaise/fatigue. Negative for fever.  HENT:  Negative for ear discharge and nosebleeds.   Eyes:  Negative for double vision and photophobia.  Respiratory:  Positive for shortness of breath. Negative for cough.   Cardiovascular:  Positive for chest pain. Negative for leg swelling.  Gastrointestinal:  Negative for abdominal pain, nausea and vomiting.  Genitourinary:  Negative for dysuria.  Musculoskeletal:  Positive for back pain.  Skin:  Negative for rash.  Neurological:  Negative for focal weakness and loss of consciousness.  Endo/Heme/Allergies:  Does not bruise/bleed easily.  Psychiatric/Behavioral:  Negative for substance abuse. The patient has insomnia.    Past Medical History:  Diagnosis Date   Anxiety    Depression    ED (erectile dysfunction)    History of colonic polyps    Hypertension     Past Surgical History:  Procedure Laterality Date   TONSILECTOMY/ADENOIDECTOMY WITH MYRINGOTOMY     VASECTOMY  1986     reports that he has never smoked. He has never used smokeless tobacco. He reports that he does not currently use alcohol. He reports that he does not use drugs.  No Known Allergies  Family History  Problem Relation Age of Onset   Bladder Cancer Mother    Heart disease Father    Heart attack  Father    Breast cancer Paternal Grandmother    Breast cancer Daughter    Alcohol abuse Sister    Hypertension Sister    Cancer Sister    Drug abuse Sister     Prior to Admission medications   Medication Sig Start Date End Date Taking? Authorizing Provider  citalopram (CELEXA) 40 MG tablet Take 1 tablet (40 mg total) by mouth daily. 02/16/21    Libby Maw, MD  cyclobenzaprine (FLEXERIL) 5 MG tablet Take 1 tablet (5 mg total) by mouth 3 (three) times daily as needed for up to 14 days for muscle spasms. 02/15/21 03/01/21  Delene Ruffini, MD  famotidine (PEPCID) 40 MG tablet Take one daily as needed for indigestion 02/02/21   Libby Maw, MD  lidocaine (LIDODERM) 5 % Place 1 patch onto the skin daily. Remove & Discard patch within 12 hours or as directed by MD 02/15/21   Delene Ruffini, MD  sildenafil (REVATIO) 20 MG tablet Take 1-3 tablets 45 minutes prior... No more than 5 pills daily 03/20/19   Libby Maw, MD  telmisartan (MICARDIS) 80 MG tablet TAKE ONE TABLET BY MOUTH DAILY 07/19/20   Kennyth Arnold, FNP    Physical Exam:  Constitutional: Elderly male who appears to be in respiratory discomfort Vitals:   02/19/21 0930 02/19/21 1030 02/19/21 1123 02/19/21 1145  BP: (!) 145/75 (!) 165/74 (!) 151/69 (!) 144/71  Pulse: 93 (!) 112 (!) 107 (!) 119  Resp: (!) 37 (!) 30 (!) 24 (!) 28  Temp:      SpO2: 99% 98% 96% 96%  Weight:      Height:       Eyes: PERRL, lids and conjunctivae normal ENMT: Mucous membranes are moist. Posterior pharynx clear of any exudate or lesions.  Neck: normal, supple, no masses, no thyromegaly Respiratory: Tachypneic with decreased overall aeration with no significant wheezes or rhonchi appreciated. Cardiovascular: Tachycardic, no murmurs / rubs / gallops. No extremity edema. 2+ pedal pulses. No carotid bruits.  Abdomen: no tenderness, no masses palpated. No hepatosplenomegaly. Bowel sounds positive.  Musculoskeletal: no clubbing / cyanosis. No joint deformity upper and lower extremities. Good ROM, no contractures. Normal muscle tone.  Skin: no rashes, lesions, ulcers. No induration Neurologic: CN 2-12 grossly intact. Sensation intact, DTR normal. Strength 5/5 in all 4.  Psychiatric: Normal judgment and insight. Alert and oriented x 3. Normal mood.     Labs on Admission:  I have personally reviewed following labs and imaging studies  CBC: Recent Labs  Lab 02/15/21 1506 02/19/21 0857  WBC 13.4* 13.0*  NEUTROABS  --  10.5*  HGB 12.6* 12.1*  HCT 36.4* 35.0*  MCV 84.5 84.3  PLT 234 [id number]   Basic Metabolic Panel: Recent Labs  Lab 02/15/21 1506 02/19/21 0857  NA 135 136  K 3.6 3.8  CL 100 101  CO2 26 26  GLUCOSE 120* 96  BUN 16 11  CREATININE 1.39* 1.51*  CALCIUM 9.0 9.1   GFR: Estimated Creatinine Clearance: 51.4 mL/min (A) (by C-G formula based on SCr of 1.51 mg/dL (H)). Liver Function Tests: Recent Labs  Lab 02/19/21 0857  AST 19  ALT 21  ALKPHOS 81  BILITOT 0.9  PROT 7.5  ALBUMIN 3.1*   Recent Labs  Lab 02/19/21 0857  LIPASE 17   No results for input(s): AMMONIA in the last 168 hours. Coagulation Profile: Recent Labs  Lab 02/19/21 0857  INR 1.1   Cardiac Enzymes: No results for input(s): CKTOTAL, CKMB,  CKMBINDEX, TROPONINI in the last 168 hours. BNP (last 3 results) No results for input(s): PROBNP in the last 8760 hours. HbA1C: No results for input(s): HGBA1C in the last 72 hours. CBG: No results for input(s): GLUCAP in the last 168 hours. Lipid Profile: No results for input(s): CHOL, HDL, LDLCALC, TRIG, CHOLHDL, LDLDIRECT in the last 72 hours. Thyroid Function Tests: No results for input(s): TSH, T4TOTAL, FREET4, T3FREE, THYROIDAB in the last 72 hours. Anemia Panel: No results for input(s): VITAMINB12, FOLATE, FERRITIN, TIBC, IRON, RETICCTPCT in the last 72 hours. Urine analysis:    Component Value Date/Time   COLORURINE YELLOW 02/19/2021 1029   APPEARANCEUR CLEAR 02/19/2021 1029   LABSPEC 1.010 02/19/2021 1029   PHURINE 6.0 02/19/2021 1029   GLUCOSEU NEGATIVE 02/19/2021 1029   GLUCOSEU NEGATIVE 01/27/2021 0943   HGBUR SMALL (A) 02/19/2021 1029   BILIRUBINUR NEGATIVE 02/19/2021 1029   KETONESUR 5 (A) 02/19/2021 1029   PROTEINUR NEGATIVE 02/19/2021 1029   UROBILINOGEN 0.2 01/27/2021 0943   NITRITE NEGATIVE  02/19/2021 1029   LEUKOCYTESUR NEGATIVE 02/19/2021 1029   Sepsis Labs: No results found for this or any previous visit (from the past 240 hour(s)).   Radiological Exams on Admission: CT Angio Chest PE W/Cm &/Or Wo Cm  Result Date: 02/19/2021 CLINICAL DATA:  Pt reports SOB and pain across chest with deep inspiration. States the pain is "deep in my muscles". Seen at [facility] on Wednesday and given muscle relaxer with some relief. EXAM: CT ANGIOGRAPHY CHEST WITH CONTRAST TECHNIQUE: Multidetector CT imaging of the chest was performed using the standard protocol during bolus administration of intravenous contrast. Multiplanar CT image reconstructions and MIPs were obtained to evaluate the vascular anatomy. CONTRAST:  92m OMNIPAQUE IOHEXOL 350 MG/ML SOLN COMPARISON:  Chest radiograph, 02/15/2021. FINDINGS: Cardiovascular: There is satisfactory opacification of the pulmonary arteries to the segmental level. There are bilateral segmental pulmonary emboli, most evident on the right. Pulmonary emboli are noted in most of the right lower lobe segmental branches as well as medial and lateral segments of the right middle lobe. There is a single pulmonary embolus to the apical segment of the right upper lobe. On the left segmental pulmonary emboli are noted to the lower lobe, specifically the posterior and lateral segments. There are no large/central pulmonary emboli. RV/LV ratio measures 1.1. Heart is top-normal in size. Trace pericardial effusion. Aorta is normal in caliber. No dissection or atherosclerosis. Mediastinum/Nodes: Shotty subcarinal adenopathy, largest noted proximally 1.5 cm in short axis. No other mediastinal adenopathy. No mediastinal or hilar masses. No enlarged hilar lymph nodes. Trachea and esophagus are unremarkable. Lungs/Pleura: Small right and trace left pleural effusions. Airspace consolidation is noted in the inferior right middle and bilateral lower lobes. Some of this opacity has a ground-glass  appearance. Opacity is likely due to atelectasis with a possible component of pulmonary infarction. Remainder of the lungs is clear. No pneumothorax. Upper Abdomen: No acute findings. Multiple low attenuation liver lesions, not fully characterized, likely cysts. Musculoskeletal: No chest wall abnormality. No acute or significant osseous findings. Review of the MIP images confirms the above findings. IMPRESSION: 1. Bilateral segmental pulmonary emboli, greater on the right. No large central pulmonary emboli. 2. Lung base opacities are noted, as well as small right and trace left pleural effusions. Lung base opacities may all be due to atelectasis, but a component of pulmonary infarction is suspected. 3. Elevated RV/LV ratio of 1.1. Absence of large or central pulmonary emboli would argue against significant heart strain. Electronically Signed  By: Lajean Manes M.D.   On: 02/19/2021 11:07    EKG: Independently reviewed. Sinus tachycardia 109 bpm  Assessment/Plan Acute respiratory distress secondary to bilateral segmental pulmonary: Acute.  Patient presents with complaints of chest pain and shortness of over the last 4 days.  Troponin  negative x 2.  Notes history of COVID-19 in May, but denies any other possible causes and make this appear provoked.  CTA of the chest found to have bilateral segmental pulmonary emboli with concern for right heart strain and possible pulmonary infarction.  Patient has been started on heparin drip per pharmacy. -Admit to a medical telemetry bed -Continuous pulse oximetry with nasal cannula oxygen maintain O2 saturation greater than 92% -Bedrest for at least 24 hours -Continue heparin per pharmacy -Check Doppler ultrasound of the lower extremities and echocardiogram -Check hypercoagulable panel, patient advised that he would have to follow-up with results with his primary care provider. -Transition to oral anticoagulation tomorrow  Leukocytosis SIRS: WBC elevated at 13  with tachycardia meeting SIRS criteria.  Symptoms thought secondary to above and less likely infectious in nature. -Continue to monitor  Renal insufficiency: Creatinine 1.51 on admission.  Patient baseline creatinine previously noted to be around 1.25.  Is not greater than 0.3 increased to suggest a acute kidney injury.  He had been given 1 L normal saline IV fluids in the ED. -Recheck kidney function in a.m.  Essential hypertension: Blood pressures currently maintained.  Home blood pressure medications include Micardis 80 mg daily. -Continue pharmacy substitution of irbesartan  Normocytic anemia: Hemoglobin 12.1 g/dL with a limit of normal normal MCV and MCH. -Recheck CBC tomorrow  Anxiety: Patient reported taking Celexa 40 mg as needed, but had not taking it in 2 months. -Informed patient that medications like Celexa would normally take 4 to 6 weeks to have any effect and should not be taken on a as needed basis  History of COVID-19: Patient had history of COVID-19 in 10/2020 and 06/2019.  DVT prophylaxis: heparin Code Status: Full Family Communication: Significant other updated at bedside Disposition Plan: Home Consults called: None Admission status: Observation  Norval Morton MD [facility]   If 7PM-7AM, please contact night-coverage   02/19/2021, 12:36 PM

## 2021-02-19 NOTE — Plan of Care (Signed)

## 2021-02-19 NOTE — Progress Notes (Signed)
   02/19/21 1930  Clinical Encounter Type  Visited With Patient and family together;Health care provider  Visit Type Initial  Referral From Physician;Patient   Chaplain responded to a consult and met with patient and his daughter. Patient indicated no needs at this time. Chaplain introduced spiritual care services. Spiritual care services available as needed.   Jeri Lager, Chaplain

## 2021-02-19 NOTE — ED Triage Notes (Signed)
Pt reports SOB and pain across chest with deep inspiration.  States the pain is "deep in my muscles".  Seen at [facility] on Wednesday and given muscle relaxer with some relief.

## 2021-02-19 NOTE — Progress Notes (Signed)
ANTICOAGULATION CONSULT NOTE - Follow Up Consult  Pharmacy Consult for heparin Indication: pulmonary embolus  No Known Allergies  Patient Measurements: Height: '6\' 1"'$  (185.4 cm) Weight: 80.3 kg (177 lb) IBW/kg (Calculated) : 79.9   Vital Signs: Temp: 99 F (37.2 C) (08/21 1925) Temp Source: Oral (08/21 1925) BP: 145/65 (08/21 2031) Pulse Rate: 87 (08/21 2000)  Labs: Recent Labs    02/19/21 0857 02/19/21 1128 02/19/21 2051  HGB 12.1*  --   --   HCT 35.0*  --   --   PLT 289  --   --   LABPROT 14.1  --   --   INR 1.1  --   --   HEPARINUNFRC  --   --  <0.10*  CREATININE 1.51*  --   --   TROPONINIHS 10 14  --      Estimated Creatinine Clearance: 51.4 mL/min (A) (by C-G formula based on SCr of 1.51 mg/dL (H)).   Medical History: Past Medical History:  Diagnosis Date   Anxiety    Depression    ED (erectile dysfunction)    History of colonic polyps    Hypertension     Medications:  Medications Prior to Admission  Medication Sig Dispense Refill Last Dose   acetaminophen (TYLENOL) 500 MG tablet Take 500 mg by mouth every 6 (six) hours as needed for mild pain.   02/19/2021   benzonatate (TESSALON) 100 MG capsule Take 100 mg by mouth 3 (three) times daily as needed for cough.   02/18/2021   Cholecalciferol (VITAMIN D3) 25 MCG (1000 UT) CAPS Take 1,000 Units by mouth daily.   02/18/2021   citalopram (CELEXA) 40 MG tablet Take 1 tablet (40 mg total) by mouth daily. 90 tablet 1 Past Month   cyclobenzaprine (FLEXERIL) 5 MG tablet Take 1 tablet (5 mg total) by mouth 3 (three) times daily as needed for up to 14 days for muscle spasms. 30 tablet 0 02/18/2021   famotidine (PEPCID) 40 MG tablet Take one daily as needed for indigestion (Patient taking differently: Take 40 mg by mouth daily as needed for indigestion.) 90 tablet 1 Past Week   lidocaine (LIDODERM) 5 % Place 1 patch onto the skin daily. Remove & Discard patch within 12 hours or as directed by MD 30 patch 0 02/18/2021    Multiple Vitamin (MULTIVITAMIN) tablet Take 1 tablet by mouth daily.   02/18/2021   sildenafil (REVATIO) 20 MG tablet Take 1-3 tablets 45 minutes prior... No more than 5 pills daily (Patient taking differently: Take 20-60 mg by mouth See admin instructions. Take 1-3 tablets 45 minutes prior... No more than 5 pills daily) 50 tablet 1 unknown   telmisartan (MICARDIS) 80 MG tablet TAKE ONE TABLET BY MOUTH DAILY (Patient taking differently: Take 80 mg by mouth daily.) 90 tablet 1 02/18/2021   zinc gluconate 50 MG tablet Take 50 mg by mouth daily.   02/18/2021    Assessment: 12 YOM with chest pain x 5 days found to have bilateral segmental pulmonary emboli. Pharmacy consulted to start IV heparin.   H/H mildly low. Plt wnl. SCr elevated   Initial HL is undetectable on 1300 units/hr. No s/s of overt bleeding noted per RN   Goal of Therapy:  Heparin level 0.3-0.7 units/ml Monitor platelets by anticoagulation protocol: Yes   Plan:  -Heparin 2000 units IV bolus then increase heparin infusion to 1550 units/hr -F/u 8 hr HL -Monitor daily HL, CBC and s/s of bleeding   Albertina Parr, PharmD.,  BCPS, BCCCP Clinical Pharmacist Please refer to [facility] for unit-specific pharmacist

## 2021-02-19 NOTE — ED Provider Notes (Signed)
[facility] EMERGENCY DEPARTMENT Provider Note   CSN: [id number] Arrival date & time: 02/19/21  0755     History Chief Complaint  Patient presents with   Shortness of Phillip Wall is a 70 y.o. male.  HPI Has had chest pain for approximately 5 days.  Pain is central, deep aching quality.  Patient reports he has felt short of breath.  He has not been able to sleep flat at night.  Chest pain is somewhat improved in an upright position.  Patient has significantly decrease his activity level due to pain and shortness of breath.  No syncopal episode.  Patient was seen in the emergency department 5 days ago and had a chest x-ray and cardiac enzymes were negative.  At that time it was thought likely due to chest wall strain from outdoor yard work several days earlier.  Patient reports despite taking multiple medications for anti-inflammatories he continues to have a lot of pain and symptoms or not improving no lower extremity swelling or calf tenderness.  No fever no productive cough.  Patient did have COVID about 3 months ago and recovered.    Past Medical History:  Diagnosis Date   Anxiety    Depression    ED (erectile dysfunction)    History of colonic polyps    Hypertension     Patient Active Problem List   Diagnosis Date Noted   Need for pneumococcal vaccination 12/10/2019   Erectile dysfunction due to arterial insufficiency 03/12/2019   Gastroesophageal reflux disease 03/12/2019   Need for influenza vaccination 03/12/2019   Inclusion cyst 09/01/2018   Healthcare maintenance 09/01/2018   Mood disorder ([place]) 12/16/2017   Essential hypertension 12/16/2017    Past Surgical History:  Procedure Laterality Date   TONSILECTOMY/ADENOIDECTOMY WITH MYRINGOTOMY     VASECTOMY  1986       Family History  Problem Relation Age of Onset   Bladder Cancer Mother    Heart disease Father    Heart attack Father    Breast cancer Paternal 23     Breast cancer Daughter    Alcohol abuse Sister    Hypertension Sister    Cancer Sister    Drug abuse Sister     Social History   Tobacco Use   Smoking status: Never   Smokeless tobacco: Never  Vaping Use   Vaping Use: Never used  Substance Use Topics   Alcohol use: Not Currently   Drug use: Never    Home Medications Prior to Admission medications   Medication Sig Start Date End Date Taking? Authorizing Provider  citalopram (CELEXA) 40 MG tablet Take 1 tablet (40 mg total) by mouth daily. 02/16/21   Libby Maw, MD  cyclobenzaprine (FLEXERIL) 5 MG tablet Take 1 tablet (5 mg total) by mouth 3 (three) times daily as needed for up to 14 days for muscle spasms. 02/15/21 03/01/21  Delene Ruffini, MD  famotidine (PEPCID) 40 MG tablet Take one daily as needed for indigestion 02/02/21   Libby Maw, MD  lidocaine (LIDODERM) 5 % Place 1 patch onto the skin daily. Remove & Discard patch within 12 hours or as directed by MD 02/15/21   Delene Ruffini, MD  sildenafil (REVATIO) 20 MG tablet Take 1-3 tablets 45 minutes prior... No more than 5 pills daily 03/20/19   Libby Maw, MD  telmisartan (MICARDIS) 80 MG tablet TAKE ONE TABLET BY MOUTH DAILY 07/19/20   Kennyth Arnold, FNP  Allergies    Patient has no known allergies.  Review of Systems   Review of Systems 10 systems reviewed and negative except as per HPI Physical Exam Updated Vital Signs BP (!) 151/69 (BP Location: Left Arm)   Pulse (!) 107   Temp 98.3 F (36.8 C)   Resp (!) 24   Ht '6\' 1"'$  (1.854 m)   Wt 80.3 kg   SpO2 96%   BMI 23.35 kg/m   Physical Exam Constitutional:      Appearance: Normal appearance.  HENT:     Mouth/Throat:     Pharynx: Oropharynx is clear.  Eyes:     Extraocular Movements: Extraocular movements intact.  Cardiovascular:     Comments: Mild tachycardia no rub murmur gallop Pulmonary:     Effort: Pulmonary effort is normal.     Comments: Breath sounds slightly  decreased at the bases. Abdominal:     General: There is no distension.     Palpations: Abdomen is soft.     Tenderness: There is no abdominal tenderness. There is no guarding.  Musculoskeletal:        General: No swelling or tenderness. Normal range of motion.     Right lower leg: No edema.     Left lower leg: No edema.  Skin:    General: Skin is warm and dry.  Neurological:     General: No focal deficit present.     Mental Status: He is alert and oriented to person, place, and time.     Coordination: Coordination normal.  Psychiatric:        Mood and Affect: Mood normal.    ED Results / Procedures / Treatments   Labs (all labs ordered are listed, but only abnormal results are displayed) Labs Reviewed  COMPREHENSIVE METABOLIC PANEL - Abnormal; Notable for the following components:      Result Value   Creatinine, Ser 1.51 (*)    Albumin 3.1 (*)    GFR, Estimated 49 (*)    All other components within normal limits  CBC WITH DIFFERENTIAL/PLATELET - Abnormal; Notable for the following components:   WBC 13.0 (*)    RBC 4.15 (*)    Hemoglobin 12.1 (*)    HCT 35.0 (*)    Neutro Abs 10.5 (*)    Monocytes Absolute 1.3 (*)    All other components within normal limits  URINALYSIS, ROUTINE W REFLEX MICROSCOPIC - Abnormal; Notable for the following components:   Hgb urine dipstick SMALL (*)    Ketones, ur 5 (*)    Bacteria, UA RARE (*)    All other components within normal limits  LIPASE, BLOOD  BRAIN NATRIURETIC PEPTIDE  PROTIME-INR  HEPARIN LEVEL (UNFRACTIONATED)  TROPONIN I (HIGH SENSITIVITY)  TROPONIN I (HIGH SENSITIVITY)    EKG EKG Interpretation  Date/Time:  Sunday February 19 2021 08:00:33 EDT Ventricular Rate:  109 PR Interval:  132 QRS Duration: 100 QT Interval:  326 QTC Calculation: 439 R Axis:   66 Text Interpretation: Sinus tachycardia Incomplete right bundle branch block Nonspecific T wave abnormality Abnormal ECG agree, no sig change since previous tracing  Confirmed by Charlesetta Shanks [phone]) on 02/19/2021 12:14:13 PM  Radiology CT Angio Chest PE W/Cm &/Or Wo Cm  Result Date: 02/19/2021 CLINICAL DATA:  Pt reports SOB and pain across chest with deep inspiration. States the pain is "deep in my muscles". Seen [facility] on Wednesday and given muscle relaxer with some relief. EXAM: CT ANGIOGRAPHY CHEST WITH CONTRAST TECHNIQUE: Multidetector CT imaging  of the chest was performed using the standard protocol during bolus administration of intravenous contrast. Multiplanar CT image reconstructions and MIPs were obtained to evaluate the vascular anatomy. CONTRAST:  23m OMNIPAQUE IOHEXOL 350 MG/ML SOLN COMPARISON:  Chest radiograph, 02/15/2021. FINDINGS: Cardiovascular: There is satisfactory opacification of the pulmonary arteries to the segmental level. There are bilateral segmental pulmonary emboli, most evident on the right. Pulmonary emboli are noted in most of the right lower lobe segmental branches as well as medial and lateral segments of the right middle lobe. There is a single pulmonary embolus to the apical segment of the right upper lobe. On the left segmental pulmonary emboli are noted to the lower lobe, specifically the posterior and lateral segments. There are no large/central pulmonary emboli. RV/LV ratio measures 1.1. Heart is top-normal in size. Trace pericardial effusion. Aorta is normal in caliber. No dissection or atherosclerosis. Mediastinum/Nodes: Shotty subcarinal adenopathy, largest noted proximally 1.5 cm in short axis. No other mediastinal adenopathy. No mediastinal or hilar masses. No enlarged hilar lymph nodes. Trachea and esophagus are unremarkable. Lungs/Pleura: Small right and trace left pleural effusions. Airspace consolidation is noted in the inferior right middle and bilateral lower lobes. Some of this opacity has a ground-glass appearance. Opacity is likely due to atelectasis with a possible component of pulmonary infarction. Remainder of  the lungs is clear. No pneumothorax. Upper Abdomen: No acute findings. Multiple low attenuation liver lesions, not fully characterized, likely cysts. Musculoskeletal: No chest wall abnormality. No acute or significant osseous findings. Review of the MIP images confirms the above findings. IMPRESSION: 1. Bilateral segmental pulmonary emboli, greater on the right. No large central pulmonary emboli. 2. Lung base opacities are noted, as well as small right and trace left pleural effusions. Lung base opacities may all be due to atelectasis, but a component of pulmonary infarction is suspected. 3. Elevated RV/LV ratio of 1.1. Absence of large or central pulmonary emboli would argue against significant heart strain. Electronically Signed   By: DLajean ManesM.D.   On: 02/19/2021 11:07    Procedures Procedures  CRITICAL CARE Performed by: MCharlesetta Shanks  Total critical care time: 30 minutes  Critical care time was exclusive of separately billable procedures and treating other patients.  Critical care was necessary to treat or prevent imminent or life-threatening deterioration.  Critical care was time spent personally by me on the following activities: development of treatment plan with patient and/or surrogate as well as nursing, discussions with consultants, evaluation of patient's response to treatment, examination of patient, obtaining history from patient or surrogate, ordering and performing treatments and interventions, ordering and review of laboratory studies, ordering and review of radiographic studies, pulse oximetry and re-evaluation of patient's condition.  Medications Ordered in ED Medications  heparin ADULT infusion 100 units/mL (25000 units/2529m (1,300 Units/hr Intravenous New Bag/Given 02/19/21 1211)  LORazepam (ATIVAN) injection 1 mg (has no administration in time range)  HYDROmorphone (DILAUDID) injection 0.5 mg (has no administration in time range)  0.9 %  sodium chloride infusion (  Intravenous Stopped 02/19/21 1033)  iohexol (OMNIPAQUE) 350 MG/ML injection 75 mL (75 mLs Intravenous Contrast Given 02/19/21 1029)  heparin bolus via infusion 5,000 Units (5,000 Units Intravenous Bolus from Bag 02/19/21 1212)    ED Course  I have reviewed the triage vital signs and the nursing notes.  Pertinent labs & imaging results that were available during my care of the patient were reviewed by me and considered in my medical decision making (see chart for details).  MDM Rules/Calculators/A&P                           Patient presents about 6 days of shortness of breath and chest pain.  He is experiencing some exertional shortness of breath and increased fatigue.  Patient was moderately tachycardic and tachypneic.  Oxygen saturation normal.  No respiratory distress at rest.  Patient did have COVID about 3 months ago.  CT PE study indicates bilateral subsegmental PE.  At this time with bilateral PE, tachycardia and tachypnea will plan for admission.  Heparin protocol ordered.  Patient treated for pain with Dilaudid and anxiety with Ativan. Final Clinical Impression(s) / ED Diagnoses Final diagnoses:  Multiple subsegmental pulmonary emboli without acute cor pulmonale ([place])    Rx / DC Orders ED Discharge Orders     None        Charlesetta Shanks, MD 02/19/21 1216

## 2021-02-19 NOTE — Progress Notes (Signed)
ANTICOAGULATION CONSULT NOTE - Initial Consult  Pharmacy Consult for heparin Indication: pulmonary embolus  No Known Allergies  Patient Measurements: Height: '6\' 1"'$  (185.4 cm) Weight: 80.3 kg (177 lb) IBW/kg (Calculated) : 79.9   Vital Signs: Temp: 98.3 F (36.8 C) (08/21 0802) BP: 151/69 (08/21 1123) Pulse Rate: 107 (08/21 1123)  Labs: Recent Labs    02/19/21 0857  HGB 12.1*  HCT 35.0*  PLT 289  LABPROT 14.1  INR 1.1  CREATININE 1.51*  TROPONINIHS 10    Estimated Creatinine Clearance: 51.4 mL/min (A) (by C-G formula based on SCr of 1.51 mg/dL (H)).   Medical History: Past Medical History:  Diagnosis Date   Anxiety    Depression    ED (erectile dysfunction)    History of colonic polyps    Hypertension     Medications:  (Not in a hospital admission)   Assessment: 2 YOM with chest pain x 5 days found to have bilateral segmental pulmonary emboli. Pharmacy consulted to start IV heparin.   H/H mildly low. Plt wnl. SCr elevated   Goal of Therapy:  Heparin level 0.3-0.7 units/ml Monitor platelets by anticoagulation protocol: Yes   Plan:  -Heparin 5000 units IV bolus followed by heparin infusion at 1300 units/hr -F/u 8 hr HL -Monitor daily HL, CBC and s/s of bleeding   Albertina Parr, PharmD., BCPS, BCCCP Clinical Pharmacist Please refer to [facility] for unit-specific pharmacist

## 2021-02-19 NOTE — ED Notes (Signed)
Called floor to give report. Unable to give report at this time due to [place] awaiting to approve this patient.

## 2021-02-19 NOTE — Progress Notes (Signed)
   02/19/21 1900  Assess: MEWS Score  BP 125/72  Pulse Rate (!) 113  ECG Heart Rate (!) 119  Resp (!) 25  SpO2 100 %  Assess: MEWS Score  MEWS Temp 0  MEWS Systolic 0  MEWS Pulse 2  MEWS RR 1  MEWS LOC 0  MEWS Score 3  MEWS Score Color Yellow  Assess: if the MEWS score is Yellow or Red  Were vital signs taken at a resting state? Yes  Focused Assessment No change from prior assessment  Early Detection of Sepsis Score *See Row Information* Medium  MEWS guidelines implemented *See Row Information* Yes  Treat  MEWS Interventions Escalated (See documentation below)  Pain Scale 0-10  Pain Score 0  Take Vital Signs  Increase Vital Sign Frequency  Yellow: Q 2hr X 2 then Q 4hr X 2, if remains yellow, continue Q 4hrs  Escalate  MEWS: Escalate Yellow: discuss with charge nurse/RN and consider discussing with provider and RRT  Notify: Charge Nurse/RN  Name of Charge Nurse/RN Notified Janett Billow  Date Charge Nurse/RN Notified 02/19/21  Time Charge Nurse/RN Notified 2026

## 2021-02-19 NOTE — ED Notes (Signed)
Accidentally clicked urine off. Pt is aware we need a sample and has a urinal at bedside. Will give [place] one as soon as he can go. Will continue to monitor.

## 2021-02-20 ENCOUNTER — Observation Stay (HOSPITAL_COMMUNITY): Payer: Medicare Other

## 2021-02-20 ENCOUNTER — Ambulatory Visit: Payer: Medicare Other | Admitting: Family Medicine

## 2021-02-20 ENCOUNTER — Other Ambulatory Visit (HOSPITAL_COMMUNITY): Payer: Self-pay

## 2021-02-20 DIAGNOSIS — F419 Anxiety disorder, unspecified: Secondary | ICD-10-CM | POA: Diagnosis present

## 2021-02-20 DIAGNOSIS — N179 Acute kidney failure, unspecified: Secondary | ICD-10-CM | POA: Diagnosis present

## 2021-02-20 DIAGNOSIS — Z79899 Other long term (current) drug therapy: Secondary | ICD-10-CM | POA: Diagnosis not present

## 2021-02-20 DIAGNOSIS — I2699 Other pulmonary embolism without acute cor pulmonale: Secondary | ICD-10-CM

## 2021-02-20 DIAGNOSIS — K219 Gastro-esophageal reflux disease without esophagitis: Secondary | ICD-10-CM | POA: Diagnosis present

## 2021-02-20 DIAGNOSIS — I2694 Multiple subsegmental pulmonary emboli without acute cor pulmonale: Secondary | ICD-10-CM

## 2021-02-20 DIAGNOSIS — Z20822 Contact with and (suspected) exposure to covid-19: Secondary | ICD-10-CM | POA: Diagnosis present

## 2021-02-20 DIAGNOSIS — D72829 Elevated white blood cell count, unspecified: Secondary | ICD-10-CM | POA: Diagnosis present

## 2021-02-20 DIAGNOSIS — Z8249 Family history of ischemic heart disease and other diseases of the circulatory system: Secondary | ICD-10-CM | POA: Diagnosis not present

## 2021-02-20 DIAGNOSIS — I10 Essential (primary) hypertension: Secondary | ICD-10-CM | POA: Diagnosis present

## 2021-02-20 DIAGNOSIS — F32A Depression, unspecified: Secondary | ICD-10-CM | POA: Diagnosis present

## 2021-02-20 DIAGNOSIS — D649 Anemia, unspecified: Secondary | ICD-10-CM | POA: Diagnosis present

## 2021-02-20 DIAGNOSIS — Z8616 Personal history of COVID-19: Secondary | ICD-10-CM | POA: Diagnosis not present

## 2021-02-20 DIAGNOSIS — R0902 Hypoxemia: Secondary | ICD-10-CM | POA: Diagnosis present

## 2021-02-20 LAB — CBC
HCT: 32.7 % — ABNORMAL LOW (ref 39.0–52.0)
Hemoglobin: 11.5 g/dL — ABNORMAL LOW (ref 13.0–17.0)
MCH: 29.2 pg (ref 26.0–34.0)
MCHC: 35.2 g/dL (ref 30.0–36.0)
MCV: 83 fL (ref 80.0–100.0)
Platelets: 330 10*3/uL (ref 150–400)
RBC: 3.94 MIL/uL — ABNORMAL LOW (ref 4.22–5.81)
RDW: 13.6 % (ref 11.5–15.5)
WBC: 11.9 10*3/uL — ABNORMAL HIGH (ref 4.0–10.5)
nRBC: 0 % (ref 0.0–0.2)

## 2021-02-20 LAB — ECHOCARDIOGRAM COMPLETE
Area-P 1/2: 6.48 cm2
Height: 73 in
S' Lateral: 2.8 cm
Weight: 2832 oz

## 2021-02-20 MED ORDER — POLYVINYL ALCOHOL 1.4 % OP SOLN
1.0000 [drp] | OPHTHALMIC | Status: DC | PRN
  Filled 2021-02-20: qty 15

## 2021-02-20 MED ORDER — RIVAROXABAN 20 MG PO TABS: 20.0000 mg | ORAL_TABLET | Freq: Every day | ORAL | Status: DC

## 2021-02-20 MED ORDER — GUAIFENESIN-DM 100-10 MG/5ML PO SYRP
5.0000 mL | ORAL_SOLUTION | ORAL | Status: DC | PRN
  Administered 2021-02-20: 5 mL via ORAL
  Filled 2021-02-20: qty 5

## 2021-02-20 MED ORDER — RIVAROXABAN 15 MG PO TABS
15.0000 mg | ORAL_TABLET | Freq: Two times a day (BID) | ORAL | Status: DC
Start: 2021-02-20 — End: 2021-02-21
  Administered 2021-02-20 – 2021-02-21 (×2): 15 mg via ORAL
  Filled 2021-02-20 (×2): qty 1

## 2021-02-20 MED ORDER — RIVAROXABAN 15 MG PO TABS
15.0000 mg | ORAL_TABLET | Freq: Once | ORAL | Status: AC
  Administered 2021-02-20: 15 mg via ORAL
  Filled 2021-02-20: qty 1

## 2021-02-20 NOTE — TOC Benefit Eligibility Note (Signed)
Patient Teacher, English as a foreign language completed.    The patient is currently admitted and upon discharge could be taking Xarelto 20 mg.  The current 30 day co-pay is, $142.00 due to a $95.00 deductible remaining.  Should be a $47.00 a month copay after this.   The patient is insured through [employer] [place] Patient Advocate Specialist [place] Team Direct Number: [phone]  Fax: [phone]

## 2021-02-20 NOTE — Progress Notes (Signed)
PROGRESS NOTE    Phillip Wall  [id number] DOB: 02-Oct-1950 DOA: 02/19/2021 PCP: Libby Maw, MD  Brief Narrative: 70 year old male with history of hypertension, anxiety, depression, GERD, had COVID-19 in the end of May, 2022 presented to the ED with chest pain and some shortness of breath that started 4 days prior to admission. In the ED he was noted to be tachycardic, tachypneic hypoxic briefly then improved, CTA chest noted bilateral pulmonary emboli with possible pulmonary infarction   Assessment & Plan:   Acute bilateral PE -Clinically improving on IV heparin, remains tachycardic and mildly hypoxic today -No clear provocation except for COVID over 2 months ago -Check 2D echocardiogram -Transition to oral Xarelto -Dopplers pending as well  Leukocytosis -Likely reactive, monitor  Mild AKI -Creatinine 1.5 on admission from baseline of 1.2 -Hold ARB  History of anxiety -Continue Celexa  DVT prophylaxis: IV heparin Code Status: Full code Family Communication: Wife at bedside Disposition Plan:  Status is: Observation  The patient will require care spanning > 2 midnights and should be moved to inpatient because: Hemodynamically unstable  Dispo: The patient is from: Home              Anticipated d/c is to: Home              Patient currently is not medically stable to d/c.   Difficult to place patient No        Consultants:    Procedures:   Antimicrobials:    Subjective: -Feels a little better overall, continues to have mild chest pains, dyspnea with activity  Objective: Vitals:   02/19/21 2200 02/19/21 2300 02/20/21 0311 02/20/21 0749  BP: 135/66 113/68 (!) 152/76 (!) 149/77  Pulse: 79 89 (!) 105 100  Resp: (!) 24 (!) 21 (!) 22 20  Temp:  98.3 F (36.8 C) 98.4 F (36.9 C) 98 F (36.7 C)  TempSrc:  Oral Oral Oral  SpO2: 96% 98% 97% 98%  Weight:      Height:        Intake/Output Summary (Last 24 hours) at 02/20/2021 1031 Last data  filed at 02/20/2021 [id number] Gross per 24 hour  Intake 609.49 ml  Output 400 ml  Net 209.49 ml   Filed Weights   02/19/21 0916  Weight: 80.3 kg    Examination:  General exam: AAOx3, nondistressed HEENT: No JVD CVS: S1-S2, regular rhythm, tachycardic Lungs: Decreased breath sounds to bases Abdomen: Soft, nontender with bowel sounds present Extremities: No edema Skin: No rash on exposed skin  Psychiatry: Mood & affect appropriate.     Data Reviewed:   CBC: Recent Labs  Lab 02/15/21 1506 02/19/21 0857  WBC 13.4* 13.0*  NEUTROABS  --  10.5*  HGB 12.6* 12.1*  HCT 36.4* 35.0*  MCV 84.5 84.3  PLT 234 [id number]   Basic Metabolic Panel: Recent Labs  Lab 02/15/21 1506 02/19/21 0857  NA 135 136  K 3.6 3.8  CL 100 101  CO2 26 26  GLUCOSE 120* 96  BUN 16 11  CREATININE 1.39* 1.51*  CALCIUM 9.0 9.1   GFR: Estimated Creatinine Clearance: 51.4 mL/min (A) (by C-G formula based on SCr of 1.51 mg/dL (H)). Liver Function Tests: Recent Labs  Lab 02/19/21 0857  AST 19  ALT 21  ALKPHOS 81  BILITOT 0.9  PROT 7.5  ALBUMIN 3.1*   Recent Labs  Lab 02/19/21 0857  LIPASE 17   No results for input(s): AMMONIA in the last 168 hours. Coagulation Profile: Recent  Labs  Lab 02/19/21 0857  INR 1.1   Cardiac Enzymes: No results for input(s): CKTOTAL, CKMB, CKMBINDEX, TROPONINI in the last 168 hours. BNP (last 3 results) No results for input(s): PROBNP in the last 8760 hours. HbA1C: No results for input(s): HGBA1C in the last 72 hours. CBG: No results for input(s): GLUCAP in the last 168 hours. Lipid Profile: No results for input(s): CHOL, HDL, LDLCALC, TRIG, CHOLHDL, LDLDIRECT in the last 72 hours. Thyroid Function Tests: No results for input(s): TSH, T4TOTAL, FREET4, T3FREE, THYROIDAB in the last 72 hours. Anemia Panel: No results for input(s): VITAMINB12, FOLATE, FERRITIN, TIBC, IRON, RETICCTPCT in the last 72 hours. Urine analysis:    Component Value Date/Time    COLORURINE YELLOW 02/19/2021 1029   APPEARANCEUR CLEAR 02/19/2021 1029   LABSPEC 1.010 02/19/2021 1029   PHURINE 6.0 02/19/2021 1029   GLUCOSEU NEGATIVE 02/19/2021 1029   GLUCOSEU NEGATIVE 01/27/2021 0943   HGBUR SMALL (A) 02/19/2021 1029   BILIRUBINUR NEGATIVE 02/19/2021 1029   KETONESUR 5 (A) 02/19/2021 1029   PROTEINUR NEGATIVE 02/19/2021 1029   UROBILINOGEN 0.2 01/27/2021 0943   NITRITE NEGATIVE 02/19/2021 1029   LEUKOCYTESUR NEGATIVE 02/19/2021 1029   Sepsis Labs: '@LABRCNTIP'$ (procalcitonin:4,lacticidven:4)  ) Recent Results (from the past 240 hour(s))  SARS CORONAVIRUS 2 (TAT 6-24 HRS) Nasopharyngeal Nasopharyngeal Swab     Status: None   Collection Time: 02/19/21  3:10 PM   Specimen: Nasopharyngeal Swab  Result Value Ref Range Status   SARS Coronavirus 2 NEGATIVE NEGATIVE Final    Comment: (NOTE) SARS-CoV-2 target nucleic acids are NOT DETECTED.  The SARS-CoV-2 RNA is generally detectable in upper and lower respiratory specimens during the acute phase of infection. Negative results do not preclude SARS-CoV-2 infection, do not rule out co-infections with other pathogens, and should not be used as the sole basis for treatment or other patient management decisions. Negative results must be combined with clinical observations, patient history, and epidemiological information. The expected result is Negative.  Fact Sheet for Patients: SugarRoll.be  Fact Sheet for Healthcare Providers: https://www.woods-mathews.com/  This test is not yet approved or cleared by the [place] FDA and  has been authorized for detection and/or diagnosis of SARS-CoV-2 by FDA under an Emergency Use Authorization (EUA). This EUA will remain  in effect (meaning this test can be used) for the duration of the COVID-19 declaration under Se ction 564(b)(1) of the Act, 21 U.S.C. section 360bbb-3(b)(1), unless the authorization is terminated or revoked  sooner.  Performed at [facility], [place] [address] [place], [place] [address]   Surgical pcr screen     Status: None   Collection Time: 02/19/21  4:03 PM   Specimen: Nasal Mucosa; Nasal Swab  Result Value Ref Range Status   MRSA, PCR NEGATIVE NEGATIVE Final   Staphylococcus aureus NEGATIVE NEGATIVE Final    Comment: (NOTE) The Xpert SA Assay (FDA approved for NASAL specimens in patients 19 years of age and older), is one component of a comprehensive surveillance program. It is not intended to diagnose infection nor to guide or monitor treatment. Performed at [facility], [place] [address], [place], [place] [address]          Radiology Studies: CT Angio Chest PE W/Cm &/Or Wo Cm  Result Date: 02/19/2021 CLINICAL DATA:  Pt reports SOB and pain across chest with deep inspiration. States the pain is "deep in my muscles". Seen at [facility] on Wednesday and given muscle relaxer with some relief. EXAM: CT ANGIOGRAPHY CHEST  WITH CONTRAST TECHNIQUE: Multidetector CT imaging of the chest was performed using the standard protocol during bolus administration of intravenous contrast. Multiplanar CT image reconstructions and MIPs were obtained to evaluate the vascular anatomy. CONTRAST:  68m OMNIPAQUE IOHEXOL 350 MG/ML SOLN COMPARISON:  Chest radiograph, 02/15/2021. FINDINGS: Cardiovascular: There is satisfactory opacification of the pulmonary arteries to the segmental level. There are bilateral segmental pulmonary emboli, most evident on the right. Pulmonary emboli are noted in most of the right lower lobe segmental branches as well as medial and lateral segments of the right middle lobe. There is a single pulmonary embolus to the apical segment of the right upper lobe. On the left segmental pulmonary emboli are noted to the lower lobe, specifically the posterior and lateral segments. There are no large/central pulmonary emboli. RV/LV ratio measures 1.1. Heart is top-normal in size. Trace  pericardial effusion. Aorta is normal in caliber. No dissection or atherosclerosis. Mediastinum/Nodes: Shotty subcarinal adenopathy, largest noted proximally 1.5 cm in short axis. No other mediastinal adenopathy. No mediastinal or hilar masses. No enlarged hilar lymph nodes. Trachea and esophagus are unremarkable. Lungs/Pleura: Small right and trace left pleural effusions. Airspace consolidation is noted in the inferior right middle and bilateral lower lobes. Some of this opacity has a ground-glass appearance. Opacity is likely due to atelectasis with a possible component of pulmonary infarction. Remainder of the lungs is clear. No pneumothorax. Upper Abdomen: No acute findings. Multiple low attenuation liver lesions, not fully characterized, likely cysts. Musculoskeletal: No chest wall abnormality. No acute or significant osseous findings. Review of the MIP images confirms the above findings. IMPRESSION: 1. Bilateral segmental pulmonary emboli, greater on the right. No large central pulmonary emboli. 2. Lung base opacities are noted, as well as small right and trace left pleural effusions. Lung base opacities may all be due to atelectasis, but a component of pulmonary infarction is suspected. 3. Elevated RV/LV ratio of 1.1. Absence of large or central pulmonary emboli would argue against significant heart strain. Electronically Signed   By: DLajean ManesM.D.   On: 02/19/2021 11:07        Scheduled Meds:  sodium chloride flush  3 mL Intravenous Q12H   Continuous Infusions:  heparin 1,600 Units/hr (02/19/21 2308)     LOS: 0 days    Time spent: 35 minutes    PDomenic Polite MD [facility]   02/20/2021, 10:31 AM

## 2021-02-20 NOTE — Discharge Instructions (Addendum)
Information on my medicine - XARELTO (rivaroxaban)  WHY WAS XARELTO PRESCRIBED FOR YOU? Xarelto was prescribed to treat blood clots that may have been found in the veins of your legs (deep vein thrombosis) or in your lungs (pulmonary embolism) and to reduce the risk of them occurring again.  What do you need to know about Xarelto? The starting dose is one 15 mg tablet taken TWICE daily with food for the FIRST 21 DAYS then on 03/13/2021  the dose is changed to one 20 mg tablet taken ONCE A DAY with your evening meal.  DO NOT stop taking Xarelto without talking to the health care provider who prescribed the medication.  Refill your prescription for 20 mg tablets before you run out.  After discharge, you should have regular check-up appointments with your healthcare provider that is prescribing your Xarelto.  In the future your dose may need to be changed if your kidney function changes by a significant amount.  What do you do if you miss a dose? If you are taking Xarelto TWICE DAILY and you miss a dose, take it as soon as you remember. You may take two 15 mg tablets (total 30 mg) at the same time then resume your regularly scheduled 15 mg twice daily the next day.  If you are taking Xarelto ONCE DAILY and you miss a dose, take it as soon as you remember on the same day then continue your regularly scheduled once daily regimen the next day. Do not take two doses of Xarelto at the same time.   Important Safety Information Xarelto is a blood thinner medicine that can cause bleeding. You should call your healthcare provider right away if you experience any of the following: Bleeding from an injury or your nose that does not stop. Unusual colored urine (red or dark brown) or unusual colored stools (red or black). Unusual bruising for unknown reasons. A serious fall or if you hit your head (even if there is no bleeding).  Some medicines may interact with Xarelto and might increase your risk  of bleeding while on Xarelto. To help avoid this, consult your healthcare provider or pharmacist prior to using any new prescription or non-prescription medications, including herbals, vitamins, non-steroidal anti-inflammatory drugs (NSAIDs) and supplements.  This website has more information on Xarelto: [email or web address]-benson.[email or web address]

## 2021-02-20 NOTE — Progress Notes (Signed)
  Echocardiogram 2D Echocardiogram has been performed.  Phillip Wall 02/20/2021, 10:06 AM

## 2021-02-20 NOTE — Progress Notes (Signed)
ANTICOAGULATION CONSULT NOTE - Initial Consult  Pharmacy Consult for heparin>Xarelto Indication: pulmonary embolus  No Known Allergies  Patient Measurements: Height: '6\' 1"'$  (185.4 cm) Weight: 80.3 kg (177 lb) IBW/kg (Calculated) : 79.9 Heparin Dosing Weight: 79.9 kg   Vital Signs: Temp: 98.3 F (36.8 C) (08/22 1118) Temp Source: Oral (08/22 1118) BP: 136/75 (08/22 1118) Pulse Rate: 96 (08/22 1118)  Labs: Recent Labs    02/19/21 0857 02/19/21 1128 02/19/21 2051  HGB 12.1*  --   --   HCT 35.0*  --   --   PLT 289  --   --   LABPROT 14.1  --   --   INR 1.1  --   --   HEPARINUNFRC  --   --  <0.10*  CREATININE 1.51*  --   --   TROPONINIHS 10 14  --     Estimated Creatinine Clearance: 51.4 mL/min (A) (by C-G formula based on SCr of 1.51 mg/dL (H)).   Medical History: Past Medical History:  Diagnosis Date   Anxiety    Depression    ED (erectile dysfunction)    History of colonic polyps    Hypertension     Medications:  Scheduled:   Rivaroxaban  15 mg Oral BID WC   Followed by   Derrill Memo ON 03/13/2021] rivaroxaban  20 mg Oral Q supper   rivaroxaban  15 mg Oral Once   sodium chloride flush  3 mL Intravenous Q12H    Assessment: 69 YOM with chest pain x 5 days found to have bilateral segmental pulmonary emboli. Pharmacy consulted to start IV heparin, now to change to Xarelto.   Hgb 12.1, plt 289. No s/sx of bleeding. Scr 1.5 (CrCl using TBW 52 mL/min).   Goal of Therapy:  Monitor platelets by anticoagulation protocol: Yes   Plan:  Xarelto 15 mg BID for 21 days then 20 mg daily thereafter Will educate before discharge Monitor CBC, and for s/sx of bleeding  Antonietta Jewel, PharmD, [facility] Pharmacist  Phone: [phone] 02/20/2021 11:45 AM  Please check AMION for all [facility] phone numbers After 10:00 PM, call [place] [phone]

## 2021-02-20 NOTE — Progress Notes (Signed)
VASCULAR LAB    Bilateral lower extremity venous duplex has been performed.  See CV proc for preliminary results.   Tiyah Zelenak, RVT 02/20/2021, 3:22 PM

## 2021-02-20 NOTE — Plan of Care (Signed)

## 2021-02-21 ENCOUNTER — Other Ambulatory Visit (HOSPITAL_COMMUNITY): Payer: Self-pay

## 2021-02-21 LAB — BASIC METABOLIC PANEL
Anion gap: 8 (ref 5–15)
BUN: 13 mg/dL (ref 8–23)
CO2: 26 mmol/L (ref 22–32)
Calcium: 8.5 mg/dL — ABNORMAL LOW (ref 8.9–10.3)
Chloride: 102 mmol/L (ref 98–111)
Creatinine, Ser: 1.44 mg/dL — ABNORMAL HIGH (ref 0.61–1.24)
GFR, Estimated: 52 mL/min — ABNORMAL LOW (ref >60)
Glucose, Bld: 97 mg/dL (ref 70–99)
Potassium: 3.6 mmol/L (ref 3.5–5.1)
Sodium: 136 mmol/L (ref 135–145)

## 2021-02-21 LAB — CBC
HCT: 30.2 % — ABNORMAL LOW (ref 39.0–52.0)
Hemoglobin: 10.7 g/dL — ABNORMAL LOW (ref 13.0–17.0)
MCH: 29.2 pg (ref 26.0–34.0)
MCHC: 35.4 g/dL (ref 30.0–36.0)
MCV: 82.5 fL (ref 80.0–100.0)
Platelets: 322 10*3/uL (ref 150–400)
RBC: 3.66 MIL/uL — ABNORMAL LOW (ref 4.22–5.81)
RDW: 13.6 % (ref 11.5–15.5)
WBC: 10.7 10*3/uL — ABNORMAL HIGH (ref 4.0–10.5)
nRBC: 0 % (ref 0.0–0.2)

## 2021-02-21 LAB — HOMOCYSTEINE: Homocysteine: 15.2 umol/L (ref 0.0–17.2)

## 2021-02-21 MED ORDER — RIVAROXABAN (XARELTO) VTE STARTER PACK (15 & 20 MG)
ORAL_TABLET | ORAL | 0 refills | Status: DC
  Filled 2021-02-21: qty 51, 30d supply, fill #0

## 2021-02-21 NOTE — TOC Benefit Eligibility Note (Signed)
Transition of Care [facility]) Benefit Eligibility Note    Patient Details  Name: Phillip Wall MRN: [id number] Date of Birth: 11/29/1950   Medication/Dose: Alveda Reasons   15 MG BID ;   CO-PAY- $ 47.00     XARELTO 20 MG DAILY :  CO-PAY- $47.00  Covered?: Yes  Tier: 3 Drug  Prescription Coverage Preferred Pharmacy: [facility] [address] , [place] with Person/Company/Phone Number:: KATHY  @  [employer]  #  [phone]  Co-Pay: $47.00  Prior Approval: No  Deductible: Met (OUT-OF-POCKET MET)  Additional Notes: ELIQUIS 2.5 MG BID   COVER- YES , CO-PAY- $47.00  , TIER- 3 DRUG   P/A-NO    and   ELIQUIS  5 MG  BID : COVER- YES   :CO-PAY- $47.00  TIER- 3 DRUG P/A-NO  and  ELIQUIS  10 MG  BID : NON-FORMULARY    Memory Argue Phone Number: 02/21/2021, 2:18 PM

## 2021-02-21 NOTE — Discharge Summary (Signed)
Physician Discharge Summary  Phillip Wall [id number] DOB: Aug 21, 1950 DOA: 02/19/2021  PCP: Libby Maw, MD  Admit date: 02/19/2021 Discharge date: 02/21/2021  Time spent: 35 minutes  Recommendations for Outpatient Follow-up:  PCP in 1 week, recommend age-appropriate malignancy screening Monitor blood pressure   Discharge Diagnoses:  Principal Problem:   Pulmonary embolus [facility]) Active Problems:   Essential hypertension   Renal insufficiency   Normocytic anemia   History of COVID-19   Anxiety   Pulmonary embolism ([place])   Discharge Condition: Stable  Diet recommendation: Low-sodium  Filed Weights   02/19/21 0916  Weight: 80.3 kg    History of present illness:  70 year old male with history of hypertension, anxiety, depression, GERD, had COVID-19 in the end of May, 2022 presented to the ED with chest pain and some shortness of breath that started 4 days prior to admission. In the ED he was noted to be tachycardic, tachypneic hypoxic briefly then improved, CTA chest noted bilateral pulmonary emboli with possible pulmonary infarction  Hospital Course:   Acute bilateral PE -CTA chest on admission noted bilateral pulmonary emboli with possible pulmonary infarction, treated with IV heparin initially, and then transition to oral Xarelto -No clear provocation except for COVID over 2 months ago -2D echocardiogram noted preserved EF, mild RV dilation which was expected -His Dopplers were negative for DVT -Continue anticoagulation for at least 3 to 6 months   Leukocytosis -Likely reactive, monitor   Mild AKI -Creatinine 1.5 on admission from baseline of 1.2 -ARB discontinued, can start low-dose amlodipine if blood pressure remains elevated follow-up   History of anxiety -Continue Celexa  Discharge Exam: Vitals:   02/21/21 1054 02/21/21 1244  BP: (!) 148/74   Pulse: (!) 107 99  Resp: (!) 22   Temp: 98 F (36.7 C)   SpO2: 95% 94%    General:  AAOx3 Cardiovascular: S1-S2, regular rhythm, mildly tachycardic Respiratory: Clear  Discharge Instructions    Allergies as of 02/21/2021   No Known Allergies      Medication List     STOP taking these medications    telmisartan 80 MG tablet Commonly known as: MICARDIS       TAKE these medications    acetaminophen 500 MG tablet Commonly known as: TYLENOL Take 500 mg by mouth every 6 (six) hours as needed for mild pain.   benzonatate 100 MG capsule Commonly known as: TESSALON Take 100 mg by mouth 3 (three) times daily as needed for cough.   citalopram 40 MG tablet Commonly known as: CELEXA Take 1 tablet (40 mg total) by mouth daily.   cyclobenzaprine 5 MG tablet Commonly known as: FLEXERIL Take 1 tablet (5 mg total) by mouth 3 (three) times daily as needed for up to 14 days for muscle spasms.   famotidine 40 MG tablet Commonly known as: Pepcid Take one daily as needed for indigestion What changed:  how much to take how to take this when to take this reasons to take this additional instructions   lidocaine 5 % Commonly known as: Lidoderm Place 1 patch onto the skin daily. Remove & Discard patch within 12 hours or as directed by MD   multivitamin tablet Take 1 tablet by mouth daily.   sildenafil 20 MG tablet Commonly known as: Revatio Take 1-3 tablets 45 minutes prior... No more than 5 pills daily What changed:  how much to take how to take this when to take this   Vitamin D3 25 MCG (1000 UT) Caps Take  1,000 Units by mouth daily.   Xarelto Starter Pack Generic drug: Rivaroxaban Stater Pack (15 mg and 20 mg) Follow package directions: Take one '15mg'$  tablet by mouth twice a day. On day 22, switch to one '20mg'$  tablet once a day. Take with food.   zinc gluconate 50 MG tablet Take 50 mg by mouth daily.       No Known Allergies    The results of significant diagnostics from this hospitalization (including imaging, microbiology, ancillary and  laboratory) are listed below for reference.    Significant Diagnostic Studies: DG Chest 2 View  Result Date: 02/15/2021 CLINICAL DATA:  Chest pain EXAM: CHEST - 2 VIEW COMPARISON:  08/21/2005 FINDINGS: Linear scarring or atelectasis left base. No focal opacity, pleural effusion or pneumothorax. Normal cardiomediastinal silhouette. IMPRESSION: No active cardiopulmonary disease. Minimal scarring or atelectasis left base Electronically Signed   By: Donavan Foil M.D.   On: 02/15/2021 15:10   CT Angio Chest PE W/Cm &/Or Wo Cm  Result Date: 02/19/2021 CLINICAL DATA:  Pt reports SOB and pain across chest with deep inspiration. States the pain is "deep in my muscles". Seen at [facility] on Wednesday and given muscle relaxer with some relief. EXAM: CT ANGIOGRAPHY CHEST WITH CONTRAST TECHNIQUE: Multidetector CT imaging of the chest was performed using the standard protocol during bolus administration of intravenous contrast. Multiplanar CT image reconstructions and MIPs were obtained to evaluate the vascular anatomy. CONTRAST:  16m OMNIPAQUE IOHEXOL 350 MG/ML SOLN COMPARISON:  Chest radiograph, 02/15/2021. FINDINGS: Cardiovascular: There is satisfactory opacification of the pulmonary arteries to the segmental level. There are bilateral segmental pulmonary emboli, most evident on the right. Pulmonary emboli are noted in most of the right lower lobe segmental branches as well as medial and lateral segments of the right middle lobe. There is a single pulmonary embolus to the apical segment of the right upper lobe. On the left segmental pulmonary emboli are noted to the lower lobe, specifically the posterior and lateral segments. There are no large/central pulmonary emboli. RV/LV ratio measures 1.1. Heart is top-normal in size. Trace pericardial effusion. Aorta is normal in caliber. No dissection or atherosclerosis. Mediastinum/Nodes: Shotty subcarinal adenopathy, largest noted proximally 1.5 cm in short axis. No other  mediastinal adenopathy. No mediastinal or hilar masses. No enlarged hilar lymph nodes. Trachea and esophagus are unremarkable. Lungs/Pleura: Small right and trace left pleural effusions. Airspace consolidation is noted in the inferior right middle and bilateral lower lobes. Some of this opacity has a ground-glass appearance. Opacity is likely due to atelectasis with a possible component of pulmonary infarction. Remainder of the lungs is clear. No pneumothorax. Upper Abdomen: No acute findings. Multiple low attenuation liver lesions, not fully characterized, likely cysts. Musculoskeletal: No chest wall abnormality. No acute or significant osseous findings. Review of the MIP images confirms the above findings. IMPRESSION: 1. Bilateral segmental pulmonary emboli, greater on the right. No large central pulmonary emboli. 2. Lung base opacities are noted, as well as small right and trace left pleural effusions. Lung base opacities may all be due to atelectasis, but a component of pulmonary infarction is suspected. 3. Elevated RV/LV ratio of 1.1. Absence of large or central pulmonary emboli would argue against significant heart strain. Electronically Signed   By: DLajean ManesM.D.   On: 02/19/2021 11:07   ECHOCARDIOGRAM COMPLETE  Result Date: 02/20/2021    ECHOCARDIOGRAM REPORT   Patient Name:   Phillip REGULADate of Exam: 02/20/2021 Medical Rec #:  [id number]  Height:       73.0 in Accession #:    [id number]       Weight:       177.0 lb Date of Birth:  1951/06/15        BSA:          2.043 m Patient Age:    19 years         BP:           149/77 mmHg Patient Gender: M                HR:           100 bpm. Exam Location:  Inpatient Procedure: 2D Echo, Cardiac Doppler and Color Doppler Indications:    Pulmonary embolus  History:        Patient has no prior history of Echocardiogram examinations.                 Signs/Symptoms:Shortness of Breath and Chest Pain; Risk                 Factors:Hypertension. Covid.   Sonographer:    Dustin Flock RDCS Referring Phys: [id number] RONDELL A SMITH IMPRESSIONS  1. Left ventricular ejection fraction, by estimation, is 60 to 65%. The left ventricle has normal function. The left ventricle has no regional wall motion abnormalities. Left ventricular diastolic parameters are consistent with Grade I diastolic dysfunction (impaired relaxation).  2. Right ventricular systolic function is normal. The right ventricular size is mildly enlarged. There is mildly elevated pulmonary artery systolic pressure. The estimated right ventricular systolic pressure is [id number] mmHg.  3. The mitral valve is normal in structure. No evidence of mitral valve regurgitation.  4. The aortic valve was not well visualized. Aortic valve regurgitation is not visualized. No aortic stenosis is present.  5. The inferior vena cava is normal in size with greater than 50% respiratory variability, suggesting right atrial pressure of 3 mmHg. FINDINGS  Left Ventricle: Left ventricular ejection fraction, by estimation, is 60 to 65%. The left ventricle has normal function. The left ventricle has no regional wall motion abnormalities. The left ventricular internal cavity size was normal in size. There is  no left ventricular hypertrophy. Left ventricular diastolic parameters are consistent with Grade I diastolic dysfunction (impaired relaxation). Right Ventricle: The right ventricular size is mildly enlarged. Right vetricular wall thickness was not well visualized. Right ventricular systolic function is normal. There is mildly elevated pulmonary artery systolic pressure. The tricuspid regurgitant  velocity is 3.03 m/s, and with an assumed right atrial pressure of 3 mmHg, the estimated right ventricular systolic pressure is [id number] mmHg. Left Atrium: Left atrial size was normal in size. Right Atrium: Right atrial size was normal in size. Pericardium: Trivial pericardial effusion is present. Mitral Valve: The mitral valve is normal in  structure. No evidence of mitral valve regurgitation. Tricuspid Valve: The tricuspid valve is normal in structure. Tricuspid valve regurgitation is trivial. Aortic Valve: The aortic valve was not well visualized. Aortic valve regurgitation is not visualized. No aortic stenosis is present. Pulmonic Valve: The pulmonic valve was not well visualized. Pulmonic valve regurgitation is not visualized. Aorta: The aortic root is normal in size and structure. Venous: The inferior vena cava is normal in size with greater than 50% respiratory variability, suggesting right atrial pressure of 3 mmHg. IAS/Shunts: The interatrial septum was not well visualized.  LEFT VENTRICLE PLAX 2D LVIDd:         4.50 cm  Diastology LVIDs:  2.80 cm  LV e' medial:    6.31 cm/s LV PW:         1.00 cm  LV E/e' medial:  12.5 LV IVS:        0.90 cm  LV e' lateral:   9.57 cm/s LVOT diam:     2.40 cm  LV E/e' lateral: 8.3 LV SV:         117 LV SV Index:   57 LVOT Area:     4.52 cm  RIGHT VENTRICLE RV Basal diam:  2.50 cm RV S prime:     19.10 cm/s TAPSE (M-mode): 3.0 cm LEFT ATRIUM             Index       RIGHT ATRIUM           Index LA diam:        2.70 cm 1.32 cm/m  RA Area:     15.00 cm LA Vol (A2C):   48.5 ml 23.74 ml/m RA Volume:   39.10 ml  19.14 ml/m LA Vol (A4C):   28.4 ml 13.90 ml/m LA Biplane Vol: 41.0 ml 20.07 ml/m  AORTIC VALVE LVOT Vmax:   145.00 cm/s LVOT Vmean:  99.800 cm/s LVOT VTI:    0.259 m  AORTA Ao Root diam: 3.30 cm MITRAL VALVE               TRICUSPID VALVE MV Area (PHT): 6.48 cm    TR Peak grad:   36.7 mmHg MV Decel Time: 117 msec    TR Vmax:        303.00 cm/s MV E velocity: 79.10 cm/s MV A velocity: 95.10 cm/s  SHUNTS MV E/A ratio:  0.83        Systemic VTI:  0.26 m                            Systemic Diam: 2.40 cm Oswaldo Milian MD Electronically signed by Oswaldo Milian MD Signature Date/Time: 02/20/2021/11:11:24 AM    Final    VAS [place] LOWER EXTREMITY VENOUS (DVT)  Result Date: 02/20/2021  Lower  Venous DVT Study Patient Name:  Phillip Wall  Date of Exam:   02/20/2021 Medical Rec #: [id number]         Accession #:    [id number] Date of Birth: [birth year]         Patient Gender: M Patient Age:   43 years Exam Location:  [facility] Procedure:      VAS [place] LOWER EXTREMITY VENOUS (DVT) Referring Phys: Fuller Plan --------------------------------------------------------------------------------  Indications: Pulmonary embolism. Other Indications: Patient had Covid infection 10/2020 and 06/2019. Comparison Study: No prior study on file Performing Technologist: Sharion Dove RVS  Examination Guidelines: A complete evaluation includes B-mode imaging, spectral Doppler, color Doppler, and power Doppler as needed of all accessible portions of each vessel. Bilateral testing is considered an integral part of a complete examination. Limited examinations for reoccurring indications may be performed as noted. The reflux portion of the exam is performed with the patient in reverse Trendelenburg.  +---------+---------------+---------+-----------+----------+--------------+ RIGHT    CompressibilityPhasicitySpontaneityPropertiesThrombus Aging +---------+---------------+---------+-----------+----------+--------------+ CFV      Full           Yes      Yes                                 +---------+---------------+---------+-----------+----------+--------------+  SFJ      Full                                                        +---------+---------------+---------+-----------+----------+--------------+ FV Prox  Full                                                        +---------+---------------+---------+-----------+----------+--------------+ FV Mid   Full                                                        +---------+---------------+---------+-----------+----------+--------------+ FV DistalFull                                                         +---------+---------------+---------+-----------+----------+--------------+ PFV      Full                                                        +---------+---------------+---------+-----------+----------+--------------+ POP      Full           Yes      Yes                                 +---------+---------------+---------+-----------+----------+--------------+ PTV      Full                                                        +---------+---------------+---------+-----------+----------+--------------+ PERO     Full                                                        +---------+---------------+---------+-----------+----------+--------------+   +---------+---------------+---------+-----------+----------+--------------+ LEFT     CompressibilityPhasicitySpontaneityPropertiesThrombus Aging +---------+---------------+---------+-----------+----------+--------------+ CFV      Full           Yes      Yes                                 +---------+---------------+---------+-----------+----------+--------------+ SFJ      Full                                                        +---------+---------------+---------+-----------+----------+--------------+  FV Prox  Full                                                        +---------+---------------+---------+-----------+----------+--------------+ FV Mid   Full                                                        +---------+---------------+---------+-----------+----------+--------------+ FV DistalFull                                                        +---------+---------------+---------+-----------+----------+--------------+ PFV      Full                                                        +---------+---------------+---------+-----------+----------+--------------+ POP      Full           Yes      Yes                                  +---------+---------------+---------+-----------+----------+--------------+ PTV      Full                                                        +---------+---------------+---------+-----------+----------+--------------+ PERO     Full                                                        +---------+---------------+---------+-----------+----------+--------------+     Summary: BILATERAL: - No evidence of deep vein thrombosis seen in the lower extremities, bilaterally. -No evidence of popliteal cyst, bilaterally.   *See table(s) above for measurements and observations.    Preliminary     Microbiology: Recent Results (from the past 240 hour(s))  SARS CORONAVIRUS 2 (TAT 6-24 HRS) Nasopharyngeal Nasopharyngeal Swab     Status: None   Collection Time: 02/19/21  3:10 PM   Specimen: Nasopharyngeal Swab  Result Value Ref Range Status   SARS Coronavirus 2 NEGATIVE NEGATIVE Final    Comment: (NOTE) SARS-CoV-2 target nucleic acids are NOT DETECTED.  The SARS-CoV-2 RNA is generally detectable in upper and lower respiratory specimens during the acute phase of infection. Negative results do not preclude SARS-CoV-2 infection, do not rule out co-infections with other pathogens, and should not be used as the sole basis for treatment or other patient management decisions. Negative results must be combined with clinical observations, patient history, and epidemiological information. The expected result is Negative.  Fact  Sheet for Patients: SugarRoll.be  Fact Sheet for Healthcare Providers: https://www.woods-mathews.com/  This test is not yet approved or cleared by the [place] FDA and  has been authorized for detection and/or diagnosis of SARS-CoV-2 by FDA under an Emergency Use Authorization (EUA). This EUA will remain  in effect (meaning this test can be used) for the duration of the COVID-19 declaration under Se ction 564(b)(1) of the Act, 21  U.S.C. section 360bbb-3(b)(1), unless the authorization is terminated or revoked sooner.  Performed at [facility], [place] [address], [place], [place] [address]   Surgical pcr screen     Status: None   Collection Time: 02/19/21  4:03 PM   Specimen: Nasal Mucosa; Nasal Swab  Result Value Ref Range Status   MRSA, PCR NEGATIVE NEGATIVE Final   Staphylococcus aureus NEGATIVE NEGATIVE Final    Comment: (NOTE) The Xpert SA Assay (FDA approved for NASAL specimens in patients 62 years of age and older), is one component of a comprehensive surveillance program. It is not intended to diagnose infection nor to guide or monitor treatment. Performed at [facility], [place] [address] [place], [place] [address]      Labs: Basic Metabolic Panel: Recent Labs  Lab 02/15/21 1506 02/19/21 0857 02/21/21 0004  NA 135 136 136  K 3.6 3.8 3.6  CL 100 101 102  CO2 '26 26 26  '$ GLUCOSE 120* 96 97  BUN '16 11 13  '$ CREATININE 1.39* 1.51* 1.44*  CALCIUM 9.0 9.1 8.5*   Liver Function Tests: Recent Labs  Lab 02/19/21 0857  AST 19  ALT 21  ALKPHOS 81  BILITOT 0.9  PROT 7.5  ALBUMIN 3.1*   Recent Labs  Lab 02/19/21 0857  LIPASE 17   No results for input(s): AMMONIA in the last 168 hours. CBC: Recent Labs  Lab 02/15/21 1506 02/19/21 0857 02/20/21 1144 02/21/21 0004  WBC 13.4* 13.0* 11.9* 10.7*  NEUTROABS  --  10.5*  --   --   HGB 12.6* 12.1* 11.5* 10.7*  HCT 36.4* 35.0* 32.7* 30.2*  MCV 84.5 84.3 83.0 82.5  PLT 234 289 330 322   Cardiac Enzymes: No results for input(s): CKTOTAL, CKMB, CKMBINDEX, TROPONINI in the last 168 hours. BNP: BNP (last 3 results) Recent Labs    02/19/21 0857  BNP 25.5    ProBNP (last 3 results) No results for input(s): PROBNP in the last 8760 hours.  CBG: No results for input(s): GLUCAP in the last 168 hours.     Signed:  Domenic Polite MD.  [facility] 02/21/2021, 2:08 PM

## 2021-02-21 NOTE — TOC Transition Note (Signed)
Transition of Care [facility]) - CM/SW Discharge Note   Patient Details  Name: Phillip Wall MRN: [id number] Date of Birth: 1950/09/19  [facility] CM/SW Contact:  Zenon Mayo, RN Phone Number: 02/21/2021, 2:17 PM   Clinical Narrative:    NCM spoke with patient, TOC has delivered the xarelto to the patient , he has been informed about his copay  and the deductilble remaining.     Final next level of care: Home/Self Care Barriers to Discharge: No Barriers Identified   Patient Goals and CMS Choice Patient states their goals for this hospitalization and ongoing recovery are:: return home   Choice offered to / list presented to : NA  Discharge Placement                       Discharge Plan and Services                  DME Agency: NA       HH Arranged: NA          Social Determinants of Health (SDOH) Interventions     Readmission Risk Interventions No flowsheet data found.

## 2021-02-21 NOTE — Progress Notes (Signed)
Patient provided with detailed verbal discharge instruction. Paper copy of discharge summary provided to patient. RN answered all questions. IV removed. Pt VSS stable. Pt belongings sent with patient. Pt discharged via wheelchair to [employer] entrance to private vehicle.

## 2021-02-22 LAB — BETA-2-GLYCOPROTEIN I ABS, IGG/M/A
Beta-2 Glyco I IgG: <9 GPI IgG units (ref 0–20)
Beta-2-Glycoprotein I IgA: <9 GPI IgA units (ref 0–25)
Beta-2-Glycoprotein I IgM: <9 GPI IgM units (ref 0–32)

## 2021-02-22 LAB — PROTEIN C, TOTAL: Protein C, Total: 98 % (ref 60–150)

## 2021-02-22 NOTE — Telephone Encounter (Signed)
Appointment 02/23/21

## 2021-02-23 ENCOUNTER — Ambulatory Visit (INDEPENDENT_AMBULATORY_CARE_PROVIDER_SITE_OTHER): Payer: Medicare Other | Admitting: Family Medicine

## 2021-02-23 ENCOUNTER — Ambulatory Visit (HOSPITAL_BASED_OUTPATIENT_CLINIC_OR_DEPARTMENT_OTHER)
Admission: RE | Admit: 2021-02-23 | Discharge: 2021-02-23 | Disposition: A | Discharge status: Home / Self Care | Payer: Medicare Other | Source: Ambulatory Visit | Attending: Family Medicine | Admitting: Family Medicine

## 2021-02-23 ENCOUNTER — Other Ambulatory Visit: Payer: Self-pay

## 2021-02-23 ENCOUNTER — Other Ambulatory Visit: Payer: Self-pay | Admitting: Family Medicine

## 2021-02-23 ENCOUNTER — Encounter: Payer: Self-pay | Admitting: Family Medicine

## 2021-02-23 VITALS — BP 126/68 | HR 100 | Temp 97.6°F | Ht 73.0 in | Wt 169.8 lb

## 2021-02-23 DIAGNOSIS — L910 Hypertrophic scar: Secondary | ICD-10-CM | POA: Diagnosis not present

## 2021-02-23 DIAGNOSIS — N5089 Other specified disorders of the male genital organs: Secondary | ICD-10-CM

## 2021-02-23 DIAGNOSIS — Z09 Encounter for follow-up examination after completed treatment for conditions other than malignant neoplasm: Secondary | ICD-10-CM | POA: Diagnosis not present

## 2021-02-23 DIAGNOSIS — R059 Cough, unspecified: Secondary | ICD-10-CM | POA: Diagnosis not present

## 2021-02-23 DIAGNOSIS — I2694 Multiple subsegmental pulmonary emboli without acute cor pulmonale: Secondary | ICD-10-CM | POA: Diagnosis not present

## 2021-02-23 LAB — COMPREHENSIVE METABOLIC PANEL
ALT: 43 U/L (ref 0–53)
AST: 29 U/L (ref 0–37)
Albumin: 3.5 g/dL (ref 3.5–5.2)
Alkaline Phosphatase: 92 U/L (ref 39–117)
BUN: 12 mg/dL (ref 6–23)
CO2: 24 mEq/L (ref 19–32)
Calcium: 9.2 mg/dL (ref 8.4–10.5)
Chloride: 100 mEq/L (ref 96–112)
Creatinine, Ser: 1.16 mg/dL (ref 0.40–1.50)
GFR: 63.64 mL/min (ref >60.00)
Glucose, Bld: 95 mg/dL (ref 70–99)
Potassium: 4.2 mEq/L (ref 3.5–5.1)
Sodium: 137 mEq/L (ref 135–145)
Total Bilirubin: 0.6 mg/dL (ref 0.2–1.2)
Total Protein: 7 g/dL (ref 6.0–8.3)

## 2021-02-23 LAB — CBC
HCT: 35.2 % — ABNORMAL LOW (ref 39.0–52.0)
Hemoglobin: 11.7 g/dL — ABNORMAL LOW (ref 13.0–17.0)
MCHC: 33.1 g/dL (ref 30.0–36.0)
MCV: 87.1 fl (ref 78.0–100.0)
Platelets: 479 10*3/uL — ABNORMAL HIGH (ref 150.0–400.0)
RBC: 4.04 Mil/uL — ABNORMAL LOW (ref 4.22–5.81)
RDW: 14.2 % (ref 11.5–15.5)
WBC: 9.8 10*3/uL (ref 4.0–10.5)

## 2021-02-23 LAB — URINALYSIS, ROUTINE W REFLEX MICROSCOPIC
Bilirubin Urine: NEGATIVE
Hgb urine dipstick: NEGATIVE
Ketones, ur: NEGATIVE
Leukocytes,Ua: NEGATIVE
Nitrite: NEGATIVE
RBC / HPF: NONE SEEN (ref 0–?)
Specific Gravity, Urine: <=1.005 — AB (ref 1.000–1.030)
Total Protein, Urine: NEGATIVE
Urine Glucose: NEGATIVE
Urobilinogen, UA: 1 (ref 0.0–1.0)
pH: 6 (ref 5.0–8.0)

## 2021-02-23 LAB — CARDIOLIPIN ANTIBODIES, IGG, IGM, IGA
Anticardiolipin IgA: <9 APL U/mL (ref 0–11)
Anticardiolipin IgG: <9 GPL U/mL (ref 0–14)
Anticardiolipin IgM: <9 MPL U/mL (ref 0–12)

## 2021-02-23 MED ORDER — BENZONATATE 200 MG PO CAPS: 200.0000 mg | ORAL_CAPSULE | Freq: Two times a day (BID) | ORAL | 0 refills | Status: DC | PRN

## 2021-02-23 NOTE — Progress Notes (Addendum)
Established Patient Office Visit  Subjective:  Patient ID: Phillip Wall, male    DOB: [birth year]  Age: 70 y.o. MRN: [id number]  CC:  Chief Complaint  Patient presents with   Hospitalization [facility] follow up, seen for blood clots.     HPI Phillip Wall presents for hospital discharge follow-up status post discovery of multisegmental pulmonary embolism.  He is slowly improving.  Difficult for him to be in the supine position.  He mostly has to sleep in a reclined position.  Cough persist.  It is nonproductive there is been no fever.  No personal family history of blood clots.  He does not smoke.  Recent PSA was low at 0.54.  He did experience a brief episode of hematuria after sex a few days ago.  It is since resolved.  Denies difficulty with urination.  Denies difficulty with stooling blood in the stool or melena.  Recent colonoscopy showed 1 small polyp.  Scheduled oral repeat 2027.  New skin lesion diagnosed as a hypertrophic scar or keloid left upper anterior chest.  No history of testicular mass.  Work-up for pro thrombosis is in progress.  Negative so far.  Continues with Xarelto.  Past Medical History:  Diagnosis Date   Anxiety    Depression    ED (erectile dysfunction)    History of colonic polyps    Hypertension     Past Surgical History:  Procedure Laterality Date   TONSILECTOMY/ADENOIDECTOMY WITH MYRINGOTOMY     VASECTOMY  6    Family History  Problem Relation Age of Onset   Bladder Cancer Mother    Heart disease Father    Heart attack Father    Breast cancer Paternal Grandmother    Breast cancer Daughter    Alcohol abuse Sister    Hypertension Sister    Cancer Sister    Drug abuse Sister     Social History   Socioeconomic History   Marital status: Married    Spouse name: Not on file   Number of children: 3   Years of education: Not on file   Highest education level: Not on file  Occupational History   Occupation: retired   Tobacco Use   Smoking status: Never   Smokeless tobacco: Never  Vaping Use   Vaping Use: Never used  Substance and Sexual Activity   Alcohol use: Not Currently   Drug use: Never   Sexual activity: Yes  Other Topics Concern   Not on file  Social History Narrative   Not on file   Social Determinants of Health   Financial Resource Strain: Low Risk    Difficulty of Paying Living Expenses: Not hard at all  Food Insecurity: No Food Insecurity   Worried About Charity fundraiser in the Last Year: Never true   [place] in the Last Year: Never true  Transportation Needs: No Transportation Needs   Lack of Transportation (Medical): No   Lack of Transportation (Non-Medical): No  Physical Activity: Insufficiently Active   Days of Exercise per Week: 2 days   Minutes of Exercise per Session: 60 min  Stress: No Stress Concern Present   Feeling of Stress : Not at all  Social Connections: Moderately Integrated   Frequency of Communication with Friends and Family: More than three times a week   Frequency of Social Gatherings with Friends and Family: More than three times a week   Attends Religious Services: More than 4  times per year   Active Member of Clubs or Organizations: No   Attends Archivist Meetings: Never   Marital Status: Married  Human resources officer Violence: Not At Risk   Fear of Current or Ex-Partner: No   Emotionally Abused: No   Physically Abused: No   Sexually Abused: No    Outpatient Medications Prior to Visit  Medication Sig Dispense Refill   acetaminophen (TYLENOL) 500 MG tablet Take 500 mg by mouth every 6 (six) hours as needed for mild pain.     Cholecalciferol (VITAMIN D3) 25 MCG (1000 UT) CAPS Take 1,000 Units by mouth daily.     citalopram (CELEXA) 40 MG tablet Take 1 tablet (40 mg total) by mouth daily. 90 tablet 1   cyclobenzaprine (FLEXERIL) 5 MG tablet Take 1 tablet (5 mg total) by mouth 3 (three) times daily as needed for up to 14 days for  muscle spasms. 30 tablet 0   famotidine (PEPCID) 40 MG tablet Take one daily as needed for indigestion (Patient taking differently: Take 40 mg by mouth daily as needed for indigestion.) 90 tablet 1   Multiple Vitamin (MULTIVITAMIN) tablet Take 1 tablet by mouth daily.     RIVAROXABAN (XARELTO) VTE STARTER PACK (15 & 20 MG) Follow package directions: Take one '15mg'$  tablet by mouth twice a day. On day 22, switch to one '20mg'$  tablet once a day. Take with food. 51 each 0   sildenafil (REVATIO) 20 MG tablet Take 1-3 tablets 45 minutes prior... No more than 5 pills daily (Patient taking differently: Take 20-60 mg by mouth See admin instructions. Take 1-3 tablets 45 minutes prior... No more than 5 pills daily) 50 tablet 1   zinc gluconate 50 MG tablet Take 50 mg by mouth daily.     benzonatate (TESSALON) 100 MG capsule Take 100 mg by mouth 3 (three) times daily as needed for cough.     lidocaine (LIDODERM) 5 % Place 1 patch onto the skin daily. Remove & Discard patch within 12 hours or as directed by MD 30 patch 0   No facility-administered medications prior to visit.    No Known Allergies  ROS Review of Systems  Constitutional:  Positive for fatigue. Negative for diaphoresis, fever and unexpected weight change.  HENT: Negative.    Eyes:  Negative for photophobia and visual disturbance.  Respiratory:  Positive for cough. Negative for chest tightness and shortness of breath.   Cardiovascular:  Negative for chest pain and palpitations.  Gastrointestinal:  Negative for abdominal pain, anal bleeding, blood in stool, nausea and vomiting.  Endocrine: Negative for polyphagia and polyuria.  Genitourinary:  Negative for decreased urine volume, difficulty urinating, frequency, hematuria, penile discharge, scrotal swelling, testicular pain and urgency.  Musculoskeletal:  Negative for gait problem and joint swelling.  Neurological:  Negative for speech difficulty and weakness.  Hematological:  Does not  bruise/bleed easily.  Psychiatric/Behavioral: Negative.       Objective:    Physical Exam Vitals and nursing note reviewed.  Constitutional:      General: He is not in acute distress.    Appearance: Normal appearance. He is normal weight. He is not ill-appearing, toxic-appearing or diaphoretic.  HENT:     Head: Normocephalic and atraumatic.     Right Ear: Tympanic membrane, ear canal and external ear normal.     Left Ear: Tympanic membrane, ear canal and external ear normal.     Mouth/Throat:     Mouth: Mucous membranes are moist.  Pharynx: Oropharynx is clear. No oropharyngeal exudate or posterior oropharyngeal erythema.  Eyes:     General: No scleral icterus.       Right eye: No discharge.        Left eye: No discharge.     Extraocular Movements: Extraocular movements intact.     Conjunctiva/sclera: Conjunctivae normal.     Pupils: Pupils are equal, round, and reactive to light.  Neck:     Vascular: No carotid bruit.  Cardiovascular:     Rate and Rhythm: Normal rate and regular rhythm.  Pulmonary:     Effort: Pulmonary effort is normal.     Breath sounds: Normal breath sounds.  Abdominal:     General: Abdomen is flat. Bowel sounds are normal. There is no distension.     Palpations: Abdomen is soft. There is no mass.     Tenderness: There is no abdominal tenderness. There is no guarding or rebound.     Hernia: No hernia is present. There is no hernia in the left inguinal area or right inguinal area.  Genitourinary:    Penis: No hypospadias, erythema, tenderness, discharge, swelling or lesions.      Testes:        Right: Mass present. Tenderness or swelling not present. Right testis is descended.        Left: Mass, tenderness or swelling not present. Left testis is descended.     Epididymis:     Right: Not inflamed or enlarged.     Left: Not inflamed or enlarged.     Prostate: Enlarged. Not tender and no nodules present.     Rectum: Guaiac result negative. External  hemorrhoid present. No mass, tenderness, anal fissure or internal hemorrhoid. Normal anal tone.     Comments: Appendiceal testicular mass? Musculoskeletal:     Cervical back: No rigidity or tenderness.  Lymphadenopathy:     Cervical: No cervical adenopathy.     Lower Body: No right inguinal adenopathy. No left inguinal adenopathy.  Skin:    General: Skin is warm and dry.  Neurological:     Mental Status: He is alert and oriented to person, place, and time.  Psychiatric:        Mood and Affect: Mood normal.        Behavior: Behavior normal.    BP 126/68 (BP Location: Right Arm, Patient Position: Sitting, Cuff Size: Normal)   Pulse 100   Temp 97.6 F (36.4 C) (Temporal)   Ht '6\' 1"'$  (1.854 m)   Wt 169 lb 12.8 oz (77 kg)   SpO2 97%   BMI 22.40 kg/m  Wt Readings from Last 3 Encounters:  02/23/21 169 lb 12.8 oz (77 kg)  02/19/21 177 lb (80.3 kg)  02/15/21 177 lb (80.3 kg)     Health Maintenance Due  Topic Date Due   Zoster Vaccines- Shingrix (1 of 2) Never done   PNA vac Low Risk Adult (2 of 2 - PCV13) 12/09/2020   INFLUENZA VACCINE  01/30/2021    There are no preventive care reminders to display for this patient.  Lab Results  Component Value Date   TSH 2.12 10/27/2016   Lab Results  Component Value Date   WBC 10.7 (H) 02/21/2021   HGB 10.7 (L) 02/21/2021   HCT 30.2 (L) 02/21/2021   MCV 82.5 02/21/2021   PLT 322 02/21/2021   Lab Results  Component Value Date   NA 136 02/21/2021   K 3.6 02/21/2021   CO2 26 02/21/2021  GLUCOSE 97 02/21/2021   BUN 13 02/21/2021   CREATININE 1.44 (H) 02/21/2021   BILITOT 0.9 02/19/2021   ALKPHOS 81 02/19/2021   AST 19 02/19/2021   ALT 21 02/19/2021   PROT 7.5 02/19/2021   ALBUMIN 3.1 (L) 02/19/2021   CALCIUM 8.5 (L) 02/21/2021   ANIONGAP 8 02/21/2021   GFR 57.11 (L) 01/27/2021   Lab Results  Component Value Date   CHOL 155 01/27/2021   Lab Results  Component Value Date   HDL 54.10 01/27/2021   Lab Results   Component Value Date   LDLCALC 90 01/27/2021   Lab Results  Component Value Date   TRIG 53.0 01/27/2021   Lab Results  Component Value Date   CHOLHDL 3 01/27/2021   No results found for: HGBA1C    Assessment & Plan:   Problem List Items Addressed This Visit       Cardiovascular and Mediastinum   Pulmonary embolism ([place])   Relevant Orders   For home use only DME Other see comment     Musculoskeletal and Integument   Keloid   Relevant Orders   Ambulatory referral to Dermatology     Other   Testicular mass   Relevant Orders   US Scrotum   Other Visit Diagnoses     Hospital discharge follow-up    -  Primary   Relevant Orders   CBC   Comprehensive metabolic panel   Urinalysis, Routine w reflex microscopic   Lactate dehydrogenase   Cough       Relevant Medications   benzonatate (TESSALON) 200 MG capsule       Meds ordered this encounter  Medications   benzonatate (TESSALON) 200 MG capsule    Sig: Take 1 capsule (200 mg total) by mouth 2 (two) times daily as needed for cough.    Dispense:  20 capsule    Refill:  0     Follow-up: Return in about 1 month (around 03/26/2021), or if symptoms worsen or fail to improve, for Continue to check and record blood pressures.Libby Maw, MD

## 2021-02-23 NOTE — Addendum Note (Signed)
Addended by: Beryle Lathe S on: 02/23/2021 01:09 PM   Modules accepted: Orders

## 2021-02-23 NOTE — Addendum Note (Signed)
Addended by: Jon Billings on: 02/23/2021 02:09 PM   Modules accepted: Orders

## 2021-02-23 NOTE — Addendum Note (Signed)
Addended by: Lynnea Ferrier on: 02/23/2021 12:36 PM   Modules accepted: Orders

## 2021-02-24 LAB — PROTEIN C ACTIVITY: Protein C Activity: 135 % (ref 73–180)

## 2021-02-24 LAB — DRVVT MIX: dRVVT Mix: 47.9 s — ABNORMAL HIGH (ref 0.0–40.4)

## 2021-02-24 LAB — PROTEIN S, TOTAL: Protein S Ag, Total: 146 % (ref 60–150)

## 2021-02-24 LAB — PTT-LA MIX: PTT-LA Mix: 57.1 s — ABNORMAL HIGH (ref 0.0–48.9)

## 2021-02-24 LAB — LACTATE DEHYDROGENASE: LDH: 152 U/L (ref 120–250)

## 2021-02-24 LAB — HEXAGONAL PHASE PHOSPHOLIPID: Hexagonal Phase Phospholipid: 5 s (ref 0–11)

## 2021-02-24 LAB — LUPUS ANTICOAGULANT PANEL
DRVVT: 56.4 s — ABNORMAL HIGH (ref 0.0–47.0)
PTT Lupus Anticoagulant: 58 s — ABNORMAL HIGH (ref 0.0–51.9)

## 2021-02-24 LAB — PROTEIN S ACTIVITY: Protein S Activity: 42 % — ABNORMAL LOW (ref 63–140)

## 2021-02-24 LAB — DRVVT CONFIRM: dRVVT Confirm: 1.3 ratio — ABNORMAL HIGH (ref 0.8–1.2)

## 2021-02-27 LAB — PROTHROMBIN GENE MUTATION

## 2021-02-28 LAB — FACTOR 5 LEIDEN

## 2021-03-01 ENCOUNTER — Telehealth (HOSPITAL_COMMUNITY): Payer: Self-pay | Admitting: Pharmacist

## 2021-03-01 ENCOUNTER — Encounter: Payer: Self-pay | Admitting: Family Medicine

## 2021-03-01 ENCOUNTER — Other Ambulatory Visit (HOSPITAL_COMMUNITY): Payer: Self-pay

## 2021-03-01 NOTE — Telephone Encounter (Signed)
Spoke with patient who verbally understood labs and U/S results patient was put on Xarelto pack would ike to know if he will need to come in for follow up to receive refills?

## 2021-03-01 NOTE — Telephone Encounter (Signed)
Pharmacy Transitions of Care Follow-up Telephone Call  Date of discharge: 02/21/21  Discharge Diagnosis: PE  How have you been since you were released from the hospital?  Doing well  Medication changes made at discharge:  - START: Xarelto Starter   - STOPPED: telmisartan  - CHANGED: n/a  Medication changes verified by the patient?  yes    Medication Accessibility:  [facility]acy: [facility]   Was the patient provided with refills on discharged medications? no   Have all prescriptions been transferred from [facility] to home pharmacy? N/a   Is the patient able to afford medications? AARP Notable copays: Xarelto $142/mo Eligible patient assistance: MAP possible    Medication Review:   RIVAROXABAN (XARELTO)  Rivaroxaban 15 mg BID initiated on 02/21/21. Will switch to 20 mg daily after 21 days (03/14/21).   - will need anticoag for 3-6 months - Discussed importance of taking medication with food and around the same time everyday  - Reviewed potential DDIs with patient  - Advised patient of medications to avoid (NSAIDs, ASA)  - Educated that Tylenol (acetaminophen) will be the preferred analgesic to prevent risk of bleeding  - Emphasized importance of monitoring for signs and symptoms of bleeding (abnormal bruising, prolonged bleeding, nose bleeds, bleeding from gums, discolored urine, black tarry stools)  - Advised patient to alert all providers of anticoagulation therapy prior to starting a new medication or having a procedure    Follow-up Appointments:  PCP Hospital f/u appt confirmed?  Appt complete, MD is following [id number] therapy.  Pt will call MD 1 week before running out of xarelto to ask for maintenance rx.  [facility] f/u appt confirmed?  N/a  If their condition worsens, is the pt aware to call PCP or go to the Emergency Dept.? yes  Final Patient Assessment:  Pt is doing well with a good understanding of his medication regimen.   He has had no issues since discharge.  It was a pleasure speaking with this patient.

## 2021-03-09 ENCOUNTER — Telehealth: Payer: Self-pay | Admitting: Family Medicine

## 2021-03-09 ENCOUNTER — Other Ambulatory Visit (HOSPITAL_COMMUNITY): Payer: Self-pay

## 2021-03-09 ENCOUNTER — Other Ambulatory Visit: Payer: Self-pay | Admitting: Internal Medicine

## 2021-03-09 DIAGNOSIS — I2694 Multiple subsegmental pulmonary emboli without acute cor pulmonale: Secondary | ICD-10-CM

## 2021-03-09 MED ORDER — RIVAROXABAN 20 MG PO TABS
20.0000 mg | ORAL_TABLET | Freq: Every day | ORAL | 2 refills | Status: DC
Start: 2021-03-09 — End: 2021-06-19

## 2021-03-09 NOTE — Telephone Encounter (Signed)
Refill request for Xarelto patient placed on starter pack while at the hospital hospital follow up was 02/23/21 patient states that he is almost out. Please advise.

## 2021-03-09 NOTE — Telephone Encounter (Signed)
What is the name of the medication? RIVAROXABAN (XARELTO) VTE STARTER PACK (15 & 20 MG) [id number]   Have you contacted your pharmacy to request a refill? Pt is needing a refill on this script. He got the first refill after leaving the hospital.    Which pharmacy would you like this sent to? Pharmacy  [facility]  [address] [place] [address]  Phone:  [phone]  Fax:  [phone]  DEA #:  [id number]     Patient notified that their request is being sent to the clinical staff for review and that they should receive a call once it is complete. If they do not receive a call within 72 hours they can check with their pharmacy or our office.

## 2021-04-19 ENCOUNTER — Ambulatory Visit: Payer: Medicare Other | Admitting: Plastic Surgery

## 2021-05-10 ENCOUNTER — Other Ambulatory Visit: Payer: Self-pay

## 2021-05-10 ENCOUNTER — Encounter: Payer: Self-pay | Admitting: Family Medicine

## 2021-05-10 ENCOUNTER — Ambulatory Visit (INDEPENDENT_AMBULATORY_CARE_PROVIDER_SITE_OTHER): Payer: Medicare Other | Admitting: Family Medicine

## 2021-05-10 VITALS — BP 158/72 | HR 76 | Temp 97.1°F | Ht 73.0 in | Wt 181.2 lb

## 2021-05-10 DIAGNOSIS — I10 Essential (primary) hypertension: Secondary | ICD-10-CM

## 2021-05-10 DIAGNOSIS — I2694 Multiple subsegmental pulmonary emboli without acute cor pulmonale: Secondary | ICD-10-CM

## 2021-05-10 LAB — CBC WITH DIFFERENTIAL/PLATELET
Basophils Absolute: 0 10*3/uL (ref 0.0–0.1)
Basophils Relative: 0.1 % (ref 0.0–3.0)
Eosinophils Absolute: 0 10*3/uL (ref 0.0–0.7)
Eosinophils Relative: 0.7 % (ref 0.0–5.0)
HCT: 41.7 % (ref 39.0–52.0)
Hemoglobin: 13.8 g/dL (ref 13.0–17.0)
Lymphocytes Relative: 23 % (ref 12.0–46.0)
Lymphs Abs: 1.4 10*3/uL (ref 0.7–4.0)
MCHC: 33.1 g/dL (ref 30.0–36.0)
MCV: 87.8 fl (ref 78.0–100.0)
Monocytes Absolute: 0.5 10*3/uL (ref 0.1–1.0)
Monocytes Relative: 7.5 % (ref 3.0–12.0)
Neutro Abs: 4.3 10*3/uL (ref 1.4–7.7)
Neutrophils Relative %: 68.7 % (ref 43.0–77.0)
Platelets: 247 10*3/uL (ref 150.0–400.0)
RBC: 4.74 Mil/uL (ref 4.22–5.81)
RDW: 15.2 % (ref 11.5–15.5)
WBC: 6.3 10*3/uL (ref 4.0–10.5)

## 2021-05-10 LAB — BASIC METABOLIC PANEL
BUN: 16 mg/dL (ref 6–23)
CO2: 26 mEq/L (ref 19–32)
Calcium: 9.5 mg/dL (ref 8.4–10.5)
Chloride: 105 mEq/L (ref 96–112)
Creatinine, Ser: 1.3 mg/dL (ref 0.40–1.50)
GFR: 55.42 mL/min — ABNORMAL LOW (ref >60.00)
Glucose, Bld: 97 mg/dL (ref 70–99)
Potassium: 4.2 mEq/L (ref 3.5–5.1)
Sodium: 138 mEq/L (ref 135–145)

## 2021-05-10 MED ORDER — AMLODIPINE BESYLATE 5 MG PO TABS: 5.0000 mg | ORAL_TABLET | Freq: Every day | ORAL | 0 refills | Status: DC

## 2021-05-10 NOTE — Progress Notes (Signed)
Established Patient Office Visit  Subjective:  Patient ID: Phillip Wall, male    DOB: [birth year]  Age: 70 y.o. MRN: [id number]  CC:  Chief Complaint  Patient presents with   Hospitalization [facility] follow up seen for blot clots. Patient would like to know how long will he need to be on Xarelto.     HPI Phillip Wall presents for follow-up of his blood pressure and unprovoked pulmonary embolism.  Has been on rivaroxaban for just under 3 months.  He has had no issues taking it but would like to discontinue it if he does not need it.  He is conscious about the risk of bleeding with it and is concerned.  Diagnosed with a pulmonary embolism back in August.  Venous ultrasounds of his legs were negative for DVT.  He did have a COVID infection approximately a month prior.  Has no family history of blood clots.  He has never had a blood clot himself.  He does not smoke.  He feels well.  He has not been losing any weight.  Denies night sweats.  Coagulation work-up in the hospital back in August was equivocal  Past Medical History:  Diagnosis Date   Anxiety    Depression    ED (erectile dysfunction)    History of colonic polyps    Hypertension     Past Surgical History:  Procedure Laterality Date   TONSILECTOMY/ADENOIDECTOMY WITH MYRINGOTOMY     VASECTOMY  1986    Family History  Problem Relation Age of Onset   Bladder Cancer Mother    Heart disease Father    Heart attack Father    Breast cancer Paternal Grandmother    Breast cancer Daughter    Alcohol abuse Sister    Hypertension Sister    Cancer Sister    Drug abuse Sister     Social History   Socioeconomic History   Marital status: Married    Spouse name: Not on file   Number of children: 3   Years of education: Not on file   Highest education level: Not on file  Occupational History   Occupation: retired  Tobacco Use   Smoking status: Never   Smokeless tobacco: Never  Vaping Use   Vaping Use:  Never used  Substance and Sexual Activity   Alcohol use: Not Currently   Drug use: Never   Sexual activity: Yes  Other Topics Concern   Not on file  Social History Narrative   Not on file   Social Determinants of Health   Financial Resource Strain: Low Risk    Difficulty of Paying Living Expenses: Not hard at all  Food Insecurity: No Food Insecurity   Worried About Charity fundraiser in the Last Year: Never true   [place] in the Last Year: Never true  Transportation Needs: No Transportation Needs   Lack of Transportation (Medical): No   Lack of Transportation (Non-Medical): No  Physical Activity: Insufficiently Active   Days of Exercise per Week: 2 days   Minutes of Exercise per Session: 60 min  Stress: No Stress Concern Present   Feeling of Stress : Not at all  Social Connections: Moderately Integrated   Frequency of Communication with Friends and Family: More than three times a week   Frequency of Social Gatherings with Friends and Family: More than three times a week   Attends Religious Services: More than 4 times per year   Active  Member of Clubs or Organizations: No   Attends Music therapist: Never   Marital Status: Married  Human resources officer Violence: Not At Risk   Fear of Current or Ex-Partner: No   Emotionally Abused: No   Physically Abused: No   Sexually Abused: No    Outpatient Medications Prior to Visit  Medication Sig Dispense Refill   acetaminophen (TYLENOL) 500 MG tablet Take 500 mg by mouth every 6 (six) hours as needed for mild pain.     Cholecalciferol (VITAMIN D3) 25 MCG (1000 UT) CAPS Take 1,000 Units by mouth daily.     citalopram (CELEXA) 40 MG tablet Take 1 tablet (40 mg total) by mouth daily. 90 tablet 1   famotidine (PEPCID) 40 MG tablet Take one daily as needed for indigestion (Patient taking differently: Take 40 mg by mouth daily as needed for indigestion.) 90 tablet 1   Multiple Vitamin (MULTIVITAMIN) tablet Take 1 tablet  by mouth daily.     rivaroxaban (XARELTO) 20 MG TABS tablet Take 1 tablet (20 mg total) by mouth daily with supper. 30 tablet 2   sildenafil (REVATIO) 20 MG tablet Take 1-3 tablets 45 minutes prior... No more than 5 pills daily (Patient taking differently: Take 20-60 mg by mouth See admin instructions. Take 1-3 tablets 45 minutes prior... No more than 5 pills daily) 50 tablet 1   zinc gluconate 50 MG tablet Take 50 mg by mouth daily.     benzonatate (TESSALON) 200 MG capsule Take 1 capsule (200 mg total) by mouth 2 (two) times daily as needed for cough. 20 capsule 0   No facility-administered medications prior to visit.    No Known Allergies  ROS Review of Systems  Constitutional:  Negative for chills, diaphoresis, fatigue, fever and unexpected weight change.  HENT: Negative.    Respiratory: Negative.  Negative for chest tightness, shortness of breath and wheezing.   Cardiovascular:  Negative for chest pain, palpitations and leg swelling.  Gastrointestinal: Negative.   Genitourinary: Negative.   Neurological:  Negative for speech difficulty and weakness.     Objective:    Physical Exam Vitals and nursing note reviewed.  Constitutional:      General: He is not in acute distress.    Appearance: Normal appearance. He is normal weight. He is not ill-appearing, toxic-appearing or diaphoretic.  HENT:     Head: Normocephalic and atraumatic.     Right Ear: Tympanic membrane, ear canal and external ear normal.     Left Ear: Tympanic membrane, ear canal and external ear normal.     Mouth/Throat:     Mouth: Mucous membranes are moist.     Pharynx: No oropharyngeal exudate or posterior oropharyngeal erythema.  Eyes:     General:        Right eye: No discharge.        Left eye: No discharge.     Extraocular Movements: Extraocular movements intact.     Conjunctiva/sclera: Conjunctivae normal.     Pupils: Pupils are equal, round, and reactive to light.  Neck:     Vascular: No carotid  bruit.  Cardiovascular:     Rate and Rhythm: Normal rate and regular rhythm.  Pulmonary:     Effort: Pulmonary effort is normal.     Breath sounds: Normal breath sounds.  Abdominal:     General: Abdomen is flat. Bowel sounds are normal. There is no distension.     Palpations: Abdomen is soft. There is no mass.  Tenderness: There is no abdominal tenderness. There is no guarding or rebound.     Hernia: No hernia is present.  Musculoskeletal:     Cervical back: No rigidity or tenderness.     Right lower leg: No edema.     Left lower leg: No edema.  Lymphadenopathy:     Cervical: No cervical adenopathy.  Skin:    General: Skin is warm and dry.  Neurological:     Mental Status: He is alert and oriented to person, place, and time.  Psychiatric:        Mood and Affect: Mood normal.        Behavior: Behavior normal.    BP (!) 158/72 (BP Location: Right Arm, Patient Position: Sitting, Cuff Size: Normal)   Pulse 76   Temp (!) 97.1 F (36.2 C) (Temporal)   Ht 6\' 1"  (1.854 m)   Wt 181 lb 3.2 oz (82.2 kg)   SpO2 98%   BMI 23.91 kg/m  Wt Readings from Last 3 Encounters:  05/10/21 181 lb 3.2 oz (82.2 kg)  02/23/21 169 lb 12.8 oz (77 kg)  02/19/21 177 lb (80.3 kg)     Health Maintenance Due  Topic Date Due   Zoster Vaccines- Shingrix (1 of 2) Never done   Pneumonia Vaccine 24+ Years old (2 - PCV) 12/09/2020   INFLUENZA VACCINE  01/30/2021    There are no preventive care reminders to display for this patient.  Lab Results  Component Value Date   TSH 2.12 10/27/2016   Lab Results  Component Value Date   WBC 9.8 02/23/2021   HGB 11.7 (L) 02/23/2021   HCT 35.2 (L) 02/23/2021   MCV 87.1 02/23/2021   PLT 479.0 (H) 02/23/2021   Lab Results  Component Value Date   NA 137 02/23/2021   K 4.2 02/23/2021   CO2 24 02/23/2021   GLUCOSE 95 02/23/2021   BUN 12 02/23/2021   CREATININE 1.16 02/23/2021   BILITOT 0.6 02/23/2021   ALKPHOS 92 02/23/2021   AST 29 02/23/2021    ALT 43 02/23/2021   PROT 7.0 02/23/2021   ALBUMIN 3.5 02/23/2021   CALCIUM 9.2 02/23/2021   ANIONGAP 8 02/21/2021   GFR 63.64 02/23/2021   Lab Results  Component Value Date   CHOL 155 01/27/2021   Lab Results  Component Value Date   HDL 54.10 01/27/2021   Lab Results  Component Value Date   LDLCALC 90 01/27/2021   Lab Results  Component Value Date   TRIG 53.0 01/27/2021   Lab Results  Component Value Date   CHOLHDL 3 01/27/2021   No results found for: HGBA1C    Assessment & Plan:   Problem List Items Addressed This Visit       Cardiovascular and Mediastinum   Essential hypertension   Relevant Medications   amLODipine (NORVASC) 5 MG tablet   Other Relevant Orders   Basic metabolic panel   Pulmonary embolus (HCC) - Primary   Relevant Medications   amLODipine (NORVASC) 5 MG tablet   Other Relevant Orders   CBC w/Diff   Antiphospholipid Syndrome Comp   Serum protein electrophoresis with reflex   PROTEIN C AG + PROTEIN S AG   Ambulatory referral to Hematology / Oncology    Meds ordered this encounter  Medications   amLODipine (NORVASC) 5 MG tablet    Sig: Take 1 tablet (5 mg total) by mouth daily.    Dispense:  90 tablet    Refill:  0  Follow-up: Return in about 2 months (around 07/10/2021).  Spent over 40 minutes interviewing patient, examining patient, and reviewing the literature.  Libby Maw, MD

## 2021-05-12 ENCOUNTER — Telehealth: Payer: Self-pay | Admitting: *Deleted

## 2021-05-12 ENCOUNTER — Encounter: Payer: Self-pay | Admitting: Family Medicine

## 2021-05-12 LAB — PROTEIN ELECTROPHORESIS, SERUM, WITH REFLEX
Albumin ELP: 4.8 g/dL (ref 3.8–4.8)
Alpha 1: 0.3 g/dL (ref 0.2–0.3)
Alpha 2: 0.8 g/dL (ref 0.5–0.9)
Beta 2: 0.3 g/dL (ref 0.2–0.5)
Beta Globulin: 0.4 g/dL (ref 0.4–0.6)
Gamma Globulin: 1.4 g/dL (ref 0.8–1.7)
Total Protein: 8.1 g/dL (ref 6.1–8.1)

## 2021-05-12 NOTE — Telephone Encounter (Signed)
Per referral Dr. Ethelene Hal - called and gave upcoming appointments - confirmed - mailed welcome packet with calendar.

## 2021-05-19 LAB — ANTIPHOSPHOLIPID SYNDROME COMP
APTT: 30.2 s
Anticardiolipin Ab, IgA: <10 [APL'U]
Anticardiolipin Ab, IgG: <10 [GPL'U]
Anticardiolipin Ab, IgM: <10 [MPL'U]
Antiphosphatidylserine IgG: 7 {GPS'U}
Antiphosphatidylserine IgM: 0 {MPS'U}
Antiprothrombin Antibody, IgG: 5 G units
Beta-2 Glycoprotein I, IgA: <10 SAU
Beta-2 Glycoprotein I, IgG: <10 SGU
Beta-2 Glycoprotein I, IgM: <10 SMU
DRVVT Confirm Seconds: 46.2 s
DRVVT Ratio: 1.6 ratio — ABNORMAL HIGH
DRVVT Screen Seconds: 81 s — ABNORMAL HIGH
Hexagonal Phospholipid Neutral: 0 s
Platelet Neutralization: 0 s

## 2021-05-19 LAB — PROTEIN C AG + PROTEIN S AG
Protein C Antigen: 114 % (ref 60–150)
Protein S Ag, Free: 71 % (ref 61–136)
Protein S Ag, Total: 87 % (ref 60–150)

## 2021-05-22 ENCOUNTER — Encounter: Payer: Self-pay | Admitting: Family Medicine

## 2021-05-23 ENCOUNTER — Encounter: Payer: Self-pay | Admitting: Family

## 2021-05-23 ENCOUNTER — Inpatient Hospital Stay: Payer: Medicare Other | Admitting: Family

## 2021-05-23 ENCOUNTER — Inpatient Hospital Stay: Payer: Medicare Other | Attending: Hematology & Oncology

## 2021-05-23 ENCOUNTER — Other Ambulatory Visit: Payer: Self-pay

## 2021-05-23 ENCOUNTER — Other Ambulatory Visit: Payer: Self-pay | Admitting: Family

## 2021-05-23 VITALS — BP 161/77 | HR 90 | Temp 97.8°F | Resp 17 | Wt 177.5 lb

## 2021-05-23 DIAGNOSIS — I2694 Multiple subsegmental pulmonary emboli without acute cor pulmonale: Secondary | ICD-10-CM

## 2021-05-23 DIAGNOSIS — D6859 Other primary thrombophilia: Secondary | ICD-10-CM | POA: Insufficient documentation

## 2021-05-23 DIAGNOSIS — Z803 Family history of malignant neoplasm of breast: Secondary | ICD-10-CM | POA: Diagnosis not present

## 2021-05-23 DIAGNOSIS — R760 Raised antibody titer: Secondary | ICD-10-CM | POA: Insufficient documentation

## 2021-05-23 LAB — CBC WITH DIFFERENTIAL (CANCER CENTER ONLY)
Abs Immature Granulocytes: 0.02 10*3/uL (ref 0.00–0.07)
Basophils Absolute: 0 10*3/uL (ref 0.0–0.1)
Basophils Relative: 0 %
Eosinophils Absolute: 0.1 10*3/uL (ref 0.0–0.5)
Eosinophils Relative: 1 %
HCT: 39.3 % (ref 39.0–52.0)
Hemoglobin: 13.5 g/dL (ref 13.0–17.0)
Immature Granulocytes: 0 %
Lymphocytes Relative: 25 %
Lymphs Abs: 1.8 10*3/uL (ref 0.7–4.0)
MCH: 29 pg (ref 26.0–34.0)
MCHC: 34.4 g/dL (ref 30.0–36.0)
MCV: 84.3 fL (ref 80.0–100.0)
Monocytes Absolute: 0.6 10*3/uL (ref 0.1–1.0)
Monocytes Relative: 8 %
Neutro Abs: 4.7 10*3/uL (ref 1.7–7.7)
Neutrophils Relative %: 66 %
Platelet Count: 220 10*3/uL (ref 150–400)
RBC: 4.66 MIL/uL (ref 4.22–5.81)
RDW: 14.3 % (ref 11.5–15.5)
WBC Count: 7.1 10*3/uL (ref 4.0–10.5)
nRBC: 0 % (ref 0.0–0.2)

## 2021-05-23 LAB — CMP (CANCER CENTER ONLY)
ALT: 17 U/L (ref 0–44)
AST: 17 U/L (ref 15–41)
Albumin: 4.5 g/dL (ref 3.5–5.0)
Alkaline Phosphatase: 59 U/L (ref 38–126)
Anion gap: 8 (ref 5–15)
BUN: 18 mg/dL (ref 8–23)
CO2: 27 mmol/L (ref 22–32)
Calcium: 9.9 mg/dL (ref 8.9–10.3)
Chloride: 101 mmol/L (ref 98–111)
Creatinine: 1.33 mg/dL — ABNORMAL HIGH (ref 0.61–1.24)
GFR, Estimated: 57 mL/min — ABNORMAL LOW (ref >60)
Glucose, Bld: 97 mg/dL (ref 70–99)
Potassium: 3.7 mmol/L (ref 3.5–5.1)
Sodium: 136 mmol/L (ref 135–145)
Total Bilirubin: 0.8 mg/dL (ref 0.3–1.2)
Total Protein: 7.9 g/dL (ref 6.5–8.1)

## 2021-05-23 LAB — D-DIMER, QUANTITATIVE: D-Dimer, Quant: <0.27 ug/mL-FEU (ref 0.00–0.50)

## 2021-05-23 LAB — LACTATE DEHYDROGENASE: LDH: 124 U/L (ref 98–192)

## 2021-05-23 NOTE — Progress Notes (Signed)
Hematology/Oncology Consultation   Name: Phillip Wall      MRN: [id number]    Location: Room/bed info not found  Date: 05/23/2021 Time:1:59 PM   REFERRING PHYSICIAN: Jon Billings, MD  REASON FOR CONSULT:  Multiple subsegmental pulmonary emboli without acute cor pulmonale    DIAGNOSIS: Bilateral subsegmental pulmonary emboli  HISTORY OF PRESENT ILLNESS: Phillip Wall is a very pleasant 70 yo African American gentleman with new diagnosis of bilateral pulmonary emboli in August. No prior history of thrombotic event.  Protein S activity was low and he was lupus anticoagulant positive.  His US of the legs was negative for DVT.  ECHO showed an EF of 60-65%.  He had had Covid for the second time in June 2022.  No travelling or injury.  No testosterone/hormone replacement use.  No smoking, ETOH or recreational drug use.  He is doing well on Xarelto and has had no issue with bleeding. No bruising or petechiae.  He only has the occasional twinge of discomfort in the right lower lung on deep inhalation. No other issues at this time.  No known family history of thrombotic event.  No personal history of cancer. His daughter has history of breast cancer.  No history of diabetes or thyroid disease.  No fever, chills, n/v, cough, rash, dizziness, SOB, chest pain, palpitations, abdominal pain or changes in bowel or bladder habits.  No swelling, tenderness, numbness or tingling in his extremities.  No falls or syncope reported.  He has a good appetite and is staying well hydrated. His weight is stable at 177 lbs.  He was in [employer] for 4 years. We are so very thankful for his service to our country.   ROS: All other 10 point review of systems is negative.   PAST MEDICAL HISTORY:   Past Medical History:  Diagnosis Date   Anxiety    Depression    ED (erectile dysfunction)    History of colonic polyps    Hypertension     ALLERGIES: No Known Allergies    MEDICATIONS:  Current  Outpatient Medications on File Prior to Visit  Medication Sig Dispense Refill   acetaminophen (TYLENOL) 500 MG tablet Take 500 mg by mouth every 6 (six) hours as needed for mild pain.     amLODipine (NORVASC) 5 MG tablet Take 1 tablet (5 mg total) by mouth daily. 90 tablet 0   Cholecalciferol (VITAMIN D3) 25 MCG (1000 UT) CAPS Take 1,000 Units by mouth daily.     citalopram (CELEXA) 40 MG tablet Take 1 tablet (40 mg total) by mouth daily. 90 tablet 1   famotidine (PEPCID) 40 MG tablet Take one daily as needed for indigestion (Patient taking differently: Take 40 mg by mouth daily as needed for indigestion.) 90 tablet 1   Multiple Vitamin (MULTIVITAMIN) tablet Take 1 tablet by mouth daily.     rivaroxaban (XARELTO) 20 MG TABS tablet Take 1 tablet (20 mg total) by mouth daily with supper. 30 tablet 2   sildenafil (REVATIO) 20 MG tablet Take 1-3 tablets 45 minutes prior... No more than 5 pills daily (Patient taking differently: Take 20-60 mg by mouth See admin instructions. Take 1-3 tablets 45 minutes prior... No more than 5 pills daily) 50 tablet 1   zinc gluconate 50 MG tablet Take 50 mg by mouth daily.     No current facility-administered medications on file prior to visit.     PAST SURGICAL HISTORY Past Surgical History:  Procedure Laterality Date  TONSILECTOMY/ADENOIDECTOMY WITH MYRINGOTOMY     VASECTOMY  1986    FAMILY HISTORY: Family History  Problem Relation Age of Onset   Bladder Cancer Mother    Heart disease Father    Heart attack Father    Breast cancer Paternal Grandmother    Breast cancer Daughter    Alcohol abuse Sister    Hypertension Sister    Cancer Sister    Drug abuse Sister     SOCIAL HISTORY:  reports that he has never smoked. He has never used smokeless tobacco. He reports that he does not currently use alcohol. He reports that he does not use drugs.  PERFORMANCE STATUS: The patient's performance status is 1 - Symptomatic but completely  ambulatory  PHYSICAL EXAM: Most Recent Vital Signs: Blood pressure (!) 161/77, pulse 90, temperature 97.8 F (36.6 C), temperature source Oral, resp. rate 17, weight 177 lb 8 oz (80.5 kg), SpO2 100 %. BP (!) 161/77 (BP Location: Right Arm, Patient Position: Sitting)   Pulse 90   Temp 97.8 F (36.6 C) (Oral)   Resp 17   Wt 177 lb 8 oz (80.5 kg)   SpO2 100%   BMI 23.42 kg/m   General Appearance:    Alert, cooperative, no distress, appears stated age  Head:    Normocephalic, without obvious abnormality, atraumatic  Eyes:    PERRL, conjunctiva/corneas clear, EOM's intact, fundi    benign, both eyes             Throat:   Lips, mucosa, and tongue normal; teeth and gums normal  Neck:   Supple, symmetrical, trachea midline, no adenopathy;       thyroid:  No enlargement/tenderness/nodules; no carotid   bruit or JVD  Back:     Symmetric, no curvature, ROM normal, no CVA tenderness  Lungs:     Clear to auscultation bilaterally, respirations unlabored  Chest wall:    No tenderness or deformity  Heart:    Regular rate and rhythm, S1 and S2 normal, no murmur, rub   or gallop  Abdomen:     Soft, non-tender, bowel sounds active all four quadrants,    no masses, no organomegaly        Extremities:   Extremities normal, atraumatic, no cyanosis or edema  Pulses:   2+ and symmetric all extremities  Skin:   Skin color, texture, turgor normal, no rashes or lesions  Lymph nodes:   Cervical, supraclavicular, and axillary nodes normal  Neurologic:   CNII-XII intact. Normal strength, sensation and reflexes      throughout    LABORATORY DATA:  Results for orders placed or performed in visit on 05/23/21 (from the past 48 hour(s))  CBC with Differential ([place] Only)     Status: None   Collection Time: 05/23/21  1:33 PM  Result Value Ref Range   WBC Count 7.1 4.0 - 10.5 K/uL   RBC 4.66 4.22 - 5.81 MIL/uL   Hemoglobin 13.5 13.0 - 17.0 g/dL   HCT 39.3 39.0 - 52.0 %   MCV 84.3 80.0 - 100.0  fL   MCH 29.0 26.0 - 34.0 pg   MCHC 34.4 30.0 - 36.0 g/dL   RDW 14.3 11.5 - 15.5 %   Platelet Count 220 150 - 400 K/uL   nRBC 0.0 0.0 - 0.2 %   Neutrophils Relative % 66 %   Neutro Abs 4.7 1.7 - 7.7 K/uL   Lymphocytes Relative 25 %   Lymphs Abs 1.8 0.7 - 4.0  K/uL   Monocytes Relative 8 %   Monocytes Absolute 0.6 0.1 - 1.0 K/uL   Eosinophils Relative 1 %   Eosinophils Absolute 0.1 0.0 - 0.5 K/uL   Basophils Relative 0 %   Basophils Absolute 0.0 0.0 - 0.1 K/uL   Immature Granulocytes 0 %   Abs Immature Granulocytes 0.02 0.00 - 0.07 K/uL    Comment: Performed at [facility] at [facility], [address] [place], [place] [address]      RADIOGRAPHY: No results found.     PATHOLOGY: None  ASSESSMENT/PLAN: Mr. Venning is a very pleasant 70 yo African American gentleman with new diagnosis of bilateral pulmonary emboli in August.  He will continue his same regimen with Xarelto.  Lupus anticoagulant and protein S studies are pending.   D-dimer is < 0.27.  We will repeat his CT angio in the next couple weeks to assess his response to Xarelto.  Follow-up in 3 months.   All questions were answered. The patient knows to call the clinic with any problems, questions or concerns. We can certainly see the patient much sooner if necessary.  The patient was discussed Dr. Marin Olp and he is in agreement with the aforementioned.   Lottie Dawson

## 2021-05-25 LAB — LUPUS ANTICOAGULANT PANEL
DRVVT: 63 s — ABNORMAL HIGH (ref 0.0–47.0)
PTT Lupus Anticoagulant: 36.2 s (ref 0.0–51.9)

## 2021-05-25 LAB — DRVVT CONFIRM: dRVVT Confirm: 1.4 ratio — ABNORMAL HIGH (ref 0.8–1.2)

## 2021-05-25 LAB — PROTEIN S ACTIVITY: Protein S Activity: 84 % (ref 63–140)

## 2021-05-25 LAB — DRVVT MIX: dRVVT Mix: 55.6 s — ABNORMAL HIGH (ref 0.0–40.4)

## 2021-05-25 LAB — PROTEIN S, TOTAL: Protein S Ag, Total: 84 % (ref 60–150)

## 2021-05-26 ENCOUNTER — Ambulatory Visit (HOSPITAL_COMMUNITY): Payer: Medicare Other | Attending: Family

## 2021-05-30 DIAGNOSIS — H04123 Dry eye syndrome of bilateral lacrimal glands: Secondary | ICD-10-CM | POA: Diagnosis not present

## 2021-05-30 DIAGNOSIS — H0288B Meibomian gland dysfunction left eye, upper and lower eyelids: Secondary | ICD-10-CM | POA: Diagnosis not present

## 2021-06-08 ENCOUNTER — Encounter: Payer: Self-pay | Admitting: Family

## 2021-06-08 ENCOUNTER — Encounter: Payer: Self-pay | Admitting: Family Medicine

## 2021-06-08 ENCOUNTER — Ambulatory Visit (INDEPENDENT_AMBULATORY_CARE_PROVIDER_SITE_OTHER): Payer: Medicare Other | Admitting: Family Medicine

## 2021-06-08 ENCOUNTER — Telehealth: Payer: Self-pay | Admitting: Family Medicine

## 2021-06-08 VITALS — Ht 73.0 in | Wt 177.0 lb

## 2021-06-08 DIAGNOSIS — I10 Essential (primary) hypertension: Secondary | ICD-10-CM

## 2021-06-08 DIAGNOSIS — B349 Viral infection, unspecified: Secondary | ICD-10-CM

## 2021-06-08 MED ORDER — CHLORPHENIRAMINE MALEATE 4 MG PO TABS: 4.0000 mg | ORAL_TABLET | Freq: Two times a day (BID) | ORAL | 0 refills | Status: DC | PRN

## 2021-06-08 MED ORDER — AMLODIPINE BESYLATE 10 MG PO TABS: 10.0000 mg | ORAL_TABLET | Freq: Every day | ORAL | 0 refills | Status: DC

## 2021-06-08 MED ORDER — BENZONATATE 200 MG PO CAPS: 200.0000 mg | ORAL_CAPSULE | Freq: Two times a day (BID) | ORAL | 0 refills | Status: DC | PRN

## 2021-06-08 NOTE — Telephone Encounter (Signed)
Pt wife called, he is having sever congestion and wanted to know what he could take since he is on BP medication. Appt has been scheduled for 12/9

## 2021-06-08 NOTE — Telephone Encounter (Signed)
Appointment scheduled for virtual 

## 2021-06-08 NOTE — Progress Notes (Signed)
Established Patient Office Visit  Subjective:  Patient ID: Phillip Wall, male    DOB: October 05, 1950  Age: 70 y.o. MRN: [id number]  CC:  Chief Complaint  Patient presents with   Sinusitis    Sinus drainage, little cough, chest congestion. Not sure what medicines patient can take     HPI ARK AGRUSA presents for 2 day ho [place], PND, runny nose, sinus pressure with ear congestion and discomfort. Now with cough.  Without SOB, wheezin, chest pain or hemoptysis. Neg home covid vaccines.  Blood pressure is down some since starting the amlodipine 5 mg tablet but he still having home readings in the 140s over low 90s.  Blood pressure was elevated at the hematologist back on the 22nd of last month.  He is tolerating amlodipine well.  Denies lower extremity edema.  Work-up continues for his spontaneous multisegmental pulmonary embolism.  Could it be COVID-related?  Past Medical History:  Diagnosis Date   Anxiety    Depression    ED (erectile dysfunction)    History of colonic polyps    Hypertension     Past Surgical History:  Procedure Laterality Date   TONSILECTOMY/ADENOIDECTOMY WITH MYRINGOTOMY     VASECTOMY  25    Family History  Problem Relation Age of Onset   Bladder Cancer Mother    Heart disease Father    Heart attack Father    Breast cancer Paternal Grandmother    Breast cancer Daughter    Alcohol abuse Sister    Hypertension Sister    Cancer Sister    Drug abuse Sister     Social History   Socioeconomic History   Marital status: Married    Spouse name: Not on file   Number of children: 3   Years of education: Not on file   Highest education level: Not on file  Occupational History   Occupation: retired  Tobacco Use   Smoking status: Never   Smokeless tobacco: Never  Vaping Use   Vaping Use: Never used  Substance and Sexual Activity   Alcohol use: Not Currently   Drug use: Never   Sexual activity: Yes  Other Topics Concern   Not on file  Social  History Narrative   Not on file   Social Determinants of Health   Financial Resource Strain: Not on file  Food Insecurity: Not on file  Transportation Needs: Not on file  Physical Activity: Not on file  Stress: Not on file  Social Connections: Not on file  Intimate Partner Violence: Not on file    Outpatient Medications Prior to Visit  Medication Sig Dispense Refill   acetaminophen (TYLENOL) 500 MG tablet Take 500 mg by mouth every 6 (six) hours as needed for mild pain.     Cholecalciferol (VITAMIN D3) 25 MCG (1000 UT) CAPS Take 1,000 Units by mouth daily.     citalopram (CELEXA) 40 MG tablet Take 1 tablet (40 mg total) by mouth daily. 90 tablet 1   famotidine (PEPCID) 40 MG tablet Take one daily as needed for indigestion (Patient taking differently: Take 40 mg by mouth daily as needed for indigestion.) 90 tablet 1   Multiple Vitamin (MULTIVITAMIN) tablet Take 1 tablet by mouth daily.     rivaroxaban (XARELTO) 20 MG TABS tablet Take 1 tablet (20 mg total) by mouth daily with supper. 30 tablet 2   sildenafil (REVATIO) 20 MG tablet Take 1-3 tablets 45 minutes prior... No more than 5 pills daily (Patient taking differently: Take  20-60 mg by mouth See admin instructions. Take 1-3 tablets 45 minutes prior... No more than 5 pills daily) 50 tablet 1   amLODipine (NORVASC) 5 MG tablet Take 1 tablet (5 mg total) by mouth daily. 90 tablet 0   zinc gluconate 50 MG tablet Take 50 mg by mouth daily. (Patient not taking: Reported on 06/08/2021)     No facility-administered medications prior to visit.    No Known Allergies  ROS Review of Systems  Constitutional:  Negative for chills, diaphoresis, fever and unexpected weight change.  HENT:  Positive for congestion, hearing loss, postnasal drip, rhinorrhea and sinus pressure. Negative for sore throat and trouble swallowing.   Eyes:  Negative for photophobia and visual disturbance.  Respiratory:  Positive for cough. Negative for shortness of breath  and wheezing.   Cardiovascular: Negative.  Negative for chest pain, palpitations and leg swelling.  Gastrointestinal:  Positive for anal bleeding. Negative for blood in stool.  Genitourinary: Negative.  Negative for hematuria.  Musculoskeletal:  Negative for arthralgias and myalgias.  Neurological:  Negative for dizziness, speech difficulty, weakness and light-headedness.     Objective:    Physical Exam Vitals and nursing note reviewed.  Constitutional:      General: He is not in acute distress.    Appearance: Normal appearance. He is not ill-appearing, toxic-appearing or diaphoretic.  HENT:     Head: Normocephalic and atraumatic.  Eyes:     General: No scleral icterus.       Right eye: No discharge.        Left eye: No discharge.     Extraocular Movements: Extraocular movements intact.     Conjunctiva/sclera: Conjunctivae normal.  Pulmonary:     Effort: Pulmonary effort is normal.  Neurological:     Mental Status: He is alert and oriented to person, place, and time.  Psychiatric:        Mood and Affect: Mood normal.        Behavior: Behavior normal.    Ht 6\' 1"  (1.854 m)   Wt 177 lb (80.3 kg)   BMI 23.35 kg/m  Wt Readings from Last 3 Encounters:  06/08/21 177 lb (80.3 kg)  05/23/21 177 lb 8 oz (80.5 kg)  05/10/21 181 lb 3.2 oz (82.2 kg)     Health Maintenance Due  Topic Date Due   Zoster Vaccines- Shingrix (1 of 2) Never done   Pneumonia Vaccine 57+ Years old (2 - PCV) 12/09/2020   INFLUENZA VACCINE  01/30/2021    There are no preventive care reminders to display for this patient.  Lab Results  Component Value Date   TSH 2.12 10/27/2016   Lab Results  Component Value Date   WBC 7.1 05/23/2021   HGB 13.5 05/23/2021   HCT 39.3 05/23/2021   MCV 84.3 05/23/2021   PLT 220 05/23/2021   Lab Results  Component Value Date   NA 136 05/23/2021   K 3.7 05/23/2021   CO2 27 05/23/2021   GLUCOSE 97 05/23/2021   BUN 18 05/23/2021   CREATININE 1.33 (H)  05/23/2021   BILITOT 0.8 05/23/2021   ALKPHOS 59 05/23/2021   AST 17 05/23/2021   ALT 17 05/23/2021   PROT 7.9 05/23/2021   ALBUMIN 4.5 05/23/2021   CALCIUM 9.9 05/23/2021   ANIONGAP 8 05/23/2021   GFR 55.42 (L) 05/10/2021   Lab Results  Component Value Date   CHOL 155 01/27/2021   Lab Results  Component Value Date   HDL 54.10 01/27/2021  Lab Results  Component Value Date   LDLCALC 90 01/27/2021   Lab Results  Component Value Date   TRIG 53.0 01/27/2021   Lab Results  Component Value Date   CHOLHDL 3 01/27/2021   No results found for: HGBA1C    Assessment & Plan:   Problem List Items Addressed This Visit       Cardiovascular and Mediastinum   Essential hypertension   Relevant Medications   amLODipine (NORVASC) 10 MG tablet   Other Visit Diagnoses     Viral syndrome    -  Primary   Relevant Medications   benzonatate (TESSALON) 200 MG capsule   chlorpheniramine (CHLOR-TRIMETON) 4 MG tablet       Meds ordered this encounter  Medications   benzonatate (TESSALON) 200 MG capsule    Sig: Take 1 capsule (200 mg total) by mouth 2 (two) times daily as needed for cough.    Dispense:  20 capsule    Refill:  0   chlorpheniramine (CHLOR-TRIMETON) 4 MG tablet    Sig: Take 1 tablet (4 mg total) by mouth 2 (two) times daily as needed for allergies.    Dispense:  14 tablet    Refill:  0   amLODipine (NORVASC) 10 MG tablet    Sig: Take 1 tablet (10 mg total) by mouth daily.    Dispense:  90 tablet    Refill:  0    Follow-up: Return Follow-up in January for scheduled appointment..  Call next week if not improving.  Have increased amlodipine to 10 mg daily.  Follow-up in January for scheduled appointment.  Libby Maw, MD

## 2021-06-09 ENCOUNTER — Ambulatory Visit: Payer: Medicare Other | Admitting: Family Medicine

## 2021-06-13 ENCOUNTER — Ambulatory Visit: Payer: Medicare Other

## 2021-06-19 ENCOUNTER — Other Ambulatory Visit: Payer: Self-pay | Admitting: Family Medicine

## 2021-06-19 ENCOUNTER — Encounter: Payer: Self-pay | Admitting: Family Medicine

## 2021-06-19 DIAGNOSIS — I2694 Multiple subsegmental pulmonary emboli without acute cor pulmonale: Secondary | ICD-10-CM

## 2021-06-20 ENCOUNTER — Ambulatory Visit: Payer: Medicare Other

## 2021-06-21 ENCOUNTER — Ambulatory Visit (HOSPITAL_COMMUNITY): Payer: Medicare Other

## 2021-07-06 ENCOUNTER — Encounter (HOSPITAL_COMMUNITY): Payer: Self-pay

## 2021-07-06 ENCOUNTER — Inpatient Hospital Stay: Payer: Medicare Other | Attending: Hematology & Oncology

## 2021-07-06 ENCOUNTER — Other Ambulatory Visit: Payer: Self-pay

## 2021-07-06 ENCOUNTER — Ambulatory Visit (HOSPITAL_COMMUNITY)
Admission: RE | Admit: 2021-07-06 | Discharge: 2021-07-06 | Disposition: A | Discharge status: Home / Self Care | Payer: Medicare Other | Source: Ambulatory Visit | Attending: Family | Admitting: Family

## 2021-07-06 DIAGNOSIS — J984 Other disorders of lung: Secondary | ICD-10-CM | POA: Diagnosis not present

## 2021-07-06 DIAGNOSIS — I2694 Multiple subsegmental pulmonary emboli without acute cor pulmonale: Secondary | ICD-10-CM

## 2021-07-06 DIAGNOSIS — R918 Other nonspecific abnormal finding of lung field: Secondary | ICD-10-CM | POA: Diagnosis not present

## 2021-07-06 DIAGNOSIS — R760 Raised antibody titer: Secondary | ICD-10-CM

## 2021-07-06 DIAGNOSIS — I2699 Other pulmonary embolism without acute cor pulmonale: Secondary | ICD-10-CM | POA: Diagnosis not present

## 2021-07-06 DIAGNOSIS — D6859 Other primary thrombophilia: Secondary | ICD-10-CM

## 2021-07-06 LAB — CBC WITH DIFFERENTIAL (CANCER CENTER ONLY)
Abs Immature Granulocytes: 0.02 10*3/uL (ref 0.00–0.07)
Basophils Absolute: 0 10*3/uL (ref 0.0–0.1)
Basophils Relative: 1 %
Eosinophils Absolute: 0.1 10*3/uL (ref 0.0–0.5)
Eosinophils Relative: 2 %
HCT: 39.5 % (ref 39.0–52.0)
Hemoglobin: 13.6 g/dL (ref 13.0–17.0)
Immature Granulocytes: 0 %
Lymphocytes Relative: 34 %
Lymphs Abs: 2 10*3/uL (ref 0.7–4.0)
MCH: 28.6 pg (ref 26.0–34.0)
MCHC: 34.4 g/dL (ref 30.0–36.0)
MCV: 83.2 fL (ref 80.0–100.0)
Monocytes Absolute: 0.6 10*3/uL (ref 0.1–1.0)
Monocytes Relative: 10 %
Neutro Abs: 3.2 10*3/uL (ref 1.7–7.7)
Neutrophils Relative %: 53 %
Platelet Count: 218 10*3/uL (ref 150–400)
RBC: 4.75 MIL/uL (ref 4.22–5.81)
RDW: 13.8 % (ref 11.5–15.5)
WBC Count: 6 10*3/uL (ref 4.0–10.5)
nRBC: 0 % (ref 0.0–0.2)

## 2021-07-06 LAB — CMP (CANCER CENTER ONLY)
ALT: 30 U/L (ref 0–44)
AST: 24 U/L (ref 15–41)
Albumin: 4.3 g/dL (ref 3.5–5.0)
Alkaline Phosphatase: 65 U/L (ref 38–126)
Anion gap: 7 (ref 5–15)
BUN: 16 mg/dL (ref 8–23)
CO2: 26 mmol/L (ref 22–32)
Calcium: 9.3 mg/dL (ref 8.9–10.3)
Chloride: 108 mmol/L (ref 98–111)
Creatinine: 1.36 mg/dL — ABNORMAL HIGH (ref 0.61–1.24)
GFR, Estimated: 56 mL/min — ABNORMAL LOW (ref >60)
Glucose, Bld: 99 mg/dL (ref 70–99)
Potassium: 4 mmol/L (ref 3.5–5.1)
Sodium: 141 mmol/L (ref 135–145)
Total Bilirubin: 0.8 mg/dL (ref 0.3–1.2)
Total Protein: 7.7 g/dL (ref 6.5–8.1)

## 2021-07-06 LAB — LACTATE DEHYDROGENASE: LDH: 128 U/L (ref 98–192)

## 2021-07-06 MED ORDER — SODIUM CHLORIDE (PF) 0.9 % IJ SOLN
INTRAMUSCULAR | Status: AC
  Filled 2021-07-06: qty 50

## 2021-07-06 MED ORDER — IOHEXOL 350 MG/ML SOLN
80.0000 mL | Freq: Once | INTRAVENOUS | Status: AC | PRN
  Administered 2021-07-06: 67 mL via INTRAVENOUS

## 2021-07-07 LAB — PROTEIN S, TOTAL: Protein S Ag, Total: 84 % (ref 60–150)

## 2021-07-07 LAB — LUPUS ANTICOAGULANT PANEL
DRVVT: 86.8 s — ABNORMAL HIGH (ref 0.0–47.0)
PTT Lupus Anticoagulant: 41 s (ref 0.0–51.9)

## 2021-07-07 LAB — PROTEIN S ACTIVITY: Protein S Activity: 119 % (ref 63–140)

## 2021-07-07 LAB — DRVVT CONFIRM: dRVVT Confirm: 1.5 ratio — ABNORMAL HIGH (ref 0.8–1.2)

## 2021-07-07 LAB — DRVVT MIX: dRVVT Mix: 70.9 s — ABNORMAL HIGH (ref 0.0–40.4)

## 2021-07-10 ENCOUNTER — Encounter: Payer: Self-pay | Admitting: Family Medicine

## 2021-07-10 ENCOUNTER — Ambulatory Visit (INDEPENDENT_AMBULATORY_CARE_PROVIDER_SITE_OTHER): Payer: Medicare Other | Admitting: Family Medicine

## 2021-07-10 ENCOUNTER — Other Ambulatory Visit: Payer: Self-pay

## 2021-07-10 VITALS — BP 138/74 | HR 73 | Temp 97.0°F | Ht 73.0 in | Wt 176.4 lb

## 2021-07-10 DIAGNOSIS — F39 Unspecified mood [affective] disorder: Secondary | ICD-10-CM | POA: Diagnosis not present

## 2021-07-10 DIAGNOSIS — N289 Disorder of kidney and ureter, unspecified: Secondary | ICD-10-CM

## 2021-07-10 DIAGNOSIS — Z23 Encounter for immunization: Secondary | ICD-10-CM | POA: Diagnosis not present

## 2021-07-10 DIAGNOSIS — I10 Essential (primary) hypertension: Secondary | ICD-10-CM

## 2021-07-10 NOTE — Progress Notes (Signed)
Established Patient Office Visit  Subjective:  Patient ID: Phillip Wall, male    DOB: 1950-09-17  Age: 71 y.o. MRN: [id number]  CC:  Chief Complaint  Patient presents with   Follow-up    2 month follow up, no concerns.     HPI Phillip Wall presents for follow-up of hypertension health maintenance, mood disorder renal insufficiency.  Labs per hematology reviewed.  GFR is stable.  Continues to do well with citalopram.  Communication reestablished with his daughter.  Blood pressure controlled with amlodipine.  Past Medical History:  Diagnosis Date   Anxiety    Depression    ED (erectile dysfunction)    History of colonic polyps    Hypertension     Past Surgical History:  Procedure Laterality Date   TONSILECTOMY/ADENOIDECTOMY WITH MYRINGOTOMY     VASECTOMY  61    Family History  Problem Relation Age of Onset   Bladder Cancer Mother    Heart disease Father    Heart attack Father    Breast cancer Paternal Grandmother    Breast cancer Daughter    Alcohol abuse Sister    Hypertension Sister    Cancer Sister    Drug abuse Sister     Social History   Socioeconomic History   Marital status: Married    Spouse name: Not on file   Number of children: 3   Years of education: Not on file   Highest education level: Not on file  Occupational History   Occupation: retired  Tobacco Use   Smoking status: Never   Smokeless tobacco: Never  Vaping Use   Vaping Use: Never used  Substance and Sexual Activity   Alcohol use: Not Currently   Drug use: Never   Sexual activity: Yes  Other Topics Concern   Not on file  Social History Narrative   Not on file   Social Determinants of Health   Financial Resource Strain: Not on file  Food Insecurity: Not on file  Transportation Needs: Not on file  Physical Activity: Not on file  Stress: Not on file  Social Connections: Not on file  Intimate Partner Violence: Not on file    Outpatient Medications Prior to Visit   Medication Sig Dispense Refill   acetaminophen (TYLENOL) 500 MG tablet Take 500 mg by mouth every 6 (six) hours as needed for mild pain.     amLODipine (NORVASC) 10 MG tablet Take 1 tablet (10 mg total) by mouth daily. 90 tablet 0   chlorpheniramine (CHLOR-TRIMETON) 4 MG tablet Take 1 tablet (4 mg total) by mouth 2 (two) times daily as needed for allergies. 14 tablet 0   Cholecalciferol (VITAMIN D3) 25 MCG (1000 UT) CAPS Take 1,000 Units by mouth daily.     citalopram (CELEXA) 40 MG tablet Take 1 tablet (40 mg total) by mouth daily. 90 tablet 1   famotidine (PEPCID) 40 MG tablet Take one daily as needed for indigestion (Patient taking differently: Take 40 mg by mouth daily as needed for indigestion.) 90 tablet 1   Multiple Vitamin (MULTIVITAMIN) tablet Take 1 tablet by mouth daily.     sildenafil (REVATIO) 20 MG tablet Take 1-3 tablets 45 minutes prior... No more than 5 pills daily (Patient taking differently: Take 20-60 mg by mouth See admin instructions. Take 1-3 tablets 45 minutes prior... No more than 5 pills daily) 50 tablet 1   XARELTO 20 MG TABS tablet TAKE 1 TABLET BY MOUTH DAILY WITH SUPPER 30 tablet 2  benzonatate (TESSALON) 200 MG capsule Take 1 capsule (200 mg total) by mouth 2 (two) times daily as needed for cough. 20 capsule 0   zinc gluconate 50 MG tablet Take 50 mg by mouth daily. (Patient not taking: Reported on 06/08/2021)     No facility-administered medications prior to visit.    No Known Allergies  ROS Review of Systems  Constitutional:  Negative for chills, diaphoresis, fatigue, fever and unexpected weight change.  HENT: Negative.    Eyes:  Negative for photophobia and visual disturbance.  Respiratory: Negative.    Cardiovascular: Negative.   Gastrointestinal: Negative.   Endocrine: Negative for polyphagia and polyuria.  Genitourinary: Negative.   Musculoskeletal: Negative.   Allergic/Immunologic: Negative for immunocompromised state.  Neurological:  Negative for  speech difficulty and weakness.  Hematological:  Does not bruise/bleed easily.  Psychiatric/Behavioral:  Negative for dysphoric mood. The patient is not nervous/anxious.      Objective:    Physical Exam Vitals and nursing note reviewed.  Constitutional:      General: He is not in acute distress.    Appearance: Normal appearance. He is normal weight. He is not ill-appearing, toxic-appearing or diaphoretic.  HENT:     Head: Normocephalic and atraumatic.     Right Ear: External ear normal.     Left Ear: External ear normal.     Mouth/Throat:     Mouth: Mucous membranes are moist.     Pharynx: Oropharynx is clear. No oropharyngeal exudate or posterior oropharyngeal erythema.  Eyes:     General: No scleral icterus.       Right eye: No discharge.        Left eye: No discharge.     Extraocular Movements: Extraocular movements intact.     Conjunctiva/sclera: Conjunctivae normal.     Pupils: Pupils are equal, round, and reactive to light.  Cardiovascular:     Rate and Rhythm: Normal rate and regular rhythm.  Pulmonary:     Effort: Pulmonary effort is normal.     Breath sounds: Normal breath sounds.  Abdominal:     General: Bowel sounds are normal.  Musculoskeletal:     Cervical back: No rigidity or tenderness.     Right lower leg: No edema.     Left lower leg: No edema.  Lymphadenopathy:     Cervical: No cervical adenopathy.  Skin:    General: Skin is warm and dry.  Neurological:     Mental Status: He is alert and oriented to person, place, and time.  Psychiatric:        Mood and Affect: Mood normal.        Behavior: Behavior normal.    BP 138/74 (BP Location: Left Arm, Patient Position: Sitting, Cuff Size: Normal)    Pulse 73    Temp (!) 97 F (36.1 C) (Temporal)    Ht 6\' 1"  (1.854 m)    Wt 176 lb 6.4 oz (80 kg)    SpO2 99%    BMI 23.27 kg/m  Wt Readings from Last 3 Encounters:  07/10/21 176 lb 6.4 oz (80 kg)  06/08/21 177 lb (80.3 kg)  05/23/21 177 lb 8 oz (80.5 kg)      Health Maintenance Due  Topic Date Due   Zoster Vaccines- Shingrix (1 of 2) Never done   Pneumonia Vaccine 76+ Years old (2 - PCV) 12/09/2020   TETANUS/TDAP  07/02/2021    There are no preventive care reminders to display for this patient.  Lab Results  Component Value Date   TSH 2.12 10/27/2016   Lab Results  Component Value Date   WBC 6.0 07/06/2021   HGB 13.6 07/06/2021   HCT 39.5 07/06/2021   MCV 83.2 07/06/2021   PLT 218 07/06/2021   Lab Results  Component Value Date   NA 141 07/06/2021   K 4.0 07/06/2021   CO2 26 07/06/2021   GLUCOSE 99 07/06/2021   BUN 16 07/06/2021   CREATININE 1.36 (H) 07/06/2021   BILITOT 0.8 07/06/2021   ALKPHOS 65 07/06/2021   AST 24 07/06/2021   ALT 30 07/06/2021   PROT 7.7 07/06/2021   ALBUMIN 4.3 07/06/2021   CALCIUM 9.3 07/06/2021   ANIONGAP 7 07/06/2021   GFR 55.42 (L) 05/10/2021   Lab Results  Component Value Date   CHOL 155 01/27/2021   Lab Results  Component Value Date   HDL 54.10 01/27/2021   Lab Results  Component Value Date   LDLCALC 90 01/27/2021   Lab Results  Component Value Date   TRIG 53.0 01/27/2021   Lab Results  Component Value Date   CHOLHDL 3 01/27/2021   No results found for: HGBA1C    Assessment & Plan:   Problem List Items Addressed This Visit       Cardiovascular and Mediastinum   Essential hypertension     Genitourinary   Renal insufficiency     Other   Mood disorder ([place])   Need for pneumococcal vaccination   Relevant Orders   Pneumococcal conjugate vaccine 20-valent (Prevnar 20)   Other Visit Diagnoses     Flu vaccine need    -  Primary   Relevant Orders   Flu vaccine HIGH DOSE PF (Fluzone High dose) (Completed)       No orders of the defined types were placed in this encounter.   Follow-up: Return in about 6 months (around 01/07/2022).   Blood pressure well controlled amlodipine.  Continue follow-up with hematology.  Will have to return for Prevnar.  Continue  citalopram and amlodipine.  Encourage hydration. Libby Maw, MD

## 2021-07-11 ENCOUNTER — Telehealth: Payer: Self-pay

## 2021-07-11 ENCOUNTER — Ambulatory Visit: Payer: Medicare Other

## 2021-07-11 NOTE — Telephone Encounter (Signed)
Called x 3 with no answer  Patient ,may reschedule next available appointment  L.Argenis Kumari,LPN

## 2021-07-13 ENCOUNTER — Encounter: Payer: Self-pay | Admitting: Family

## 2021-07-14 ENCOUNTER — Telehealth: Payer: Self-pay | Admitting: Family

## 2021-07-14 NOTE — Telephone Encounter (Signed)
Left message with call back number to discuss recent CT angio results as well as lab work.

## 2021-07-17 ENCOUNTER — Other Ambulatory Visit: Payer: Self-pay | Admitting: Family

## 2021-07-17 ENCOUNTER — Telehealth: Payer: Self-pay | Admitting: Family

## 2021-07-17 DIAGNOSIS — N2889 Other specified disorders of kidney and ureter: Secondary | ICD-10-CM

## 2021-07-17 DIAGNOSIS — N289 Disorder of kidney and ureter, unspecified: Secondary | ICD-10-CM

## 2021-07-17 NOTE — Telephone Encounter (Signed)
I was able to speak with Phillip Wall and go over recent CT angio results. He had a subpleural right lower lobe nodule noted and will have a repeat Ct scan in 3 month. There was also an indeterminate lesion noted on the upper pole of the right kidney and we will get and MRI within the next week or so to better evaluate. The patient verbalized understanding and agreement with the plan. He was appreciative of the call.

## 2021-07-27 ENCOUNTER — Ambulatory Visit (HOSPITAL_COMMUNITY)
Admission: RE | Admit: 2021-07-27 | Discharge: 2021-07-27 | Disposition: A | Discharge status: Home / Self Care | Payer: Medicare Other | Source: Ambulatory Visit | Attending: Family | Admitting: Family

## 2021-07-27 ENCOUNTER — Other Ambulatory Visit: Payer: Self-pay

## 2021-07-27 DIAGNOSIS — N2889 Other specified disorders of kidney and ureter: Secondary | ICD-10-CM | POA: Diagnosis not present

## 2021-07-27 DIAGNOSIS — N289 Disorder of kidney and ureter, unspecified: Secondary | ICD-10-CM | POA: Insufficient documentation

## 2021-07-27 DIAGNOSIS — K7689 Other specified diseases of liver: Secondary | ICD-10-CM | POA: Diagnosis not present

## 2021-07-27 DIAGNOSIS — N281 Cyst of kidney, acquired: Secondary | ICD-10-CM | POA: Diagnosis not present

## 2021-07-27 MED ORDER — GADOBUTROL 1 MMOL/ML IV SOLN
8.0000 mL | Freq: Once | INTRAVENOUS | Status: AC | PRN
  Administered 2021-07-27: 8 mL via INTRAVENOUS

## 2021-07-28 ENCOUNTER — Other Ambulatory Visit: Payer: Self-pay | Admitting: Family

## 2021-07-28 ENCOUNTER — Telehealth: Payer: Self-pay | Admitting: Family

## 2021-07-28 DIAGNOSIS — N289 Disorder of kidney and ureter, unspecified: Secondary | ICD-10-CM

## 2021-07-28 DIAGNOSIS — C641 Malignant neoplasm of right kidney, except renal pelvis: Secondary | ICD-10-CM

## 2021-07-28 NOTE — Telephone Encounter (Signed)
I was able to speak with both the patient and his wife over the phone regarding his MRI results reflecting a new diagnosis of right kidney lesion highly suspicious for malignancy. Dr. Marin Olp also aware. Urgent referral placed with Dr. Dutch Gray at [facility] urology for further assessment and treatment. We will continue to follow along closely. Patient in agreement with the plan. No questions or concerns at this time. Patient was appreciative of call.

## 2021-07-31 ENCOUNTER — Encounter: Payer: Self-pay | Admitting: Family

## 2021-07-31 ENCOUNTER — Ambulatory Visit: Payer: Medicare Other | Admitting: Family Medicine

## 2021-08-09 DIAGNOSIS — H40013 Open angle with borderline findings, low risk, bilateral: Secondary | ICD-10-CM | POA: Diagnosis not present

## 2021-08-09 DIAGNOSIS — H524 Presbyopia: Secondary | ICD-10-CM | POA: Diagnosis not present

## 2021-08-09 DIAGNOSIS — H04123 Dry eye syndrome of bilateral lacrimal glands: Secondary | ICD-10-CM | POA: Diagnosis not present

## 2021-08-09 DIAGNOSIS — H25813 Combined forms of age-related cataract, bilateral: Secondary | ICD-10-CM | POA: Diagnosis not present

## 2021-08-09 DIAGNOSIS — H0288B Meibomian gland dysfunction left eye, upper and lower eyelids: Secondary | ICD-10-CM | POA: Diagnosis not present

## 2021-08-11 DIAGNOSIS — L910 Hypertrophic scar: Secondary | ICD-10-CM | POA: Diagnosis not present

## 2021-08-11 DIAGNOSIS — L821 Other seborrheic keratosis: Secondary | ICD-10-CM | POA: Diagnosis not present

## 2021-08-14 ENCOUNTER — Other Ambulatory Visit: Payer: Self-pay | Admitting: Family Medicine

## 2021-08-14 ENCOUNTER — Encounter: Payer: Self-pay | Admitting: Family

## 2021-08-14 DIAGNOSIS — I10 Essential (primary) hypertension: Secondary | ICD-10-CM

## 2021-08-15 ENCOUNTER — Other Ambulatory Visit: Payer: Self-pay | Admitting: Family

## 2021-08-22 ENCOUNTER — Ambulatory Visit: Payer: Medicare Other | Admitting: Family

## 2021-08-22 ENCOUNTER — Other Ambulatory Visit: Payer: Medicare Other

## 2021-08-31 ENCOUNTER — Other Ambulatory Visit: Payer: Self-pay | Admitting: Family Medicine

## 2021-08-31 DIAGNOSIS — I10 Essential (primary) hypertension: Secondary | ICD-10-CM

## 2021-09-05 DIAGNOSIS — D49511 Neoplasm of unspecified behavior of right kidney: Secondary | ICD-10-CM | POA: Diagnosis not present

## 2021-09-10 ENCOUNTER — Other Ambulatory Visit: Payer: Self-pay | Admitting: Family Medicine

## 2021-09-10 DIAGNOSIS — I2694 Multiple subsegmental pulmonary emboli without acute cor pulmonale: Secondary | ICD-10-CM

## 2021-09-12 ENCOUNTER — Other Ambulatory Visit: Payer: Self-pay | Admitting: Family

## 2021-09-12 DIAGNOSIS — I2694 Multiple subsegmental pulmonary emboli without acute cor pulmonale: Secondary | ICD-10-CM

## 2021-09-12 DIAGNOSIS — C641 Malignant neoplasm of right kidney, except renal pelvis: Secondary | ICD-10-CM

## 2021-09-12 DIAGNOSIS — R760 Raised antibody titer: Secondary | ICD-10-CM

## 2021-09-13 ENCOUNTER — Inpatient Hospital Stay: Payer: Medicare Other | Admitting: Family

## 2021-09-13 ENCOUNTER — Inpatient Hospital Stay: Payer: Medicare Other | Attending: Hematology & Oncology

## 2021-09-13 ENCOUNTER — Encounter: Payer: Self-pay | Admitting: Family

## 2021-09-13 ENCOUNTER — Other Ambulatory Visit: Payer: Self-pay

## 2021-09-13 VITALS — BP 140/65 | HR 80 | Temp 98.0°F | Resp 18 | Ht 73.0 in | Wt 176.4 lb

## 2021-09-13 DIAGNOSIS — I2694 Multiple subsegmental pulmonary emboli without acute cor pulmonale: Secondary | ICD-10-CM | POA: Diagnosis not present

## 2021-09-13 DIAGNOSIS — R760 Raised antibody titer: Secondary | ICD-10-CM | POA: Insufficient documentation

## 2021-09-13 DIAGNOSIS — C641 Malignant neoplasm of right kidney, except renal pelvis: Secondary | ICD-10-CM

## 2021-09-13 DIAGNOSIS — Z7901 Long term (current) use of anticoagulants: Secondary | ICD-10-CM | POA: Diagnosis not present

## 2021-09-13 LAB — CMP (CANCER CENTER ONLY)
ALT: 16 U/L (ref 0–44)
AST: 19 U/L (ref 15–41)
Albumin: 4.3 g/dL (ref 3.5–5.0)
Alkaline Phosphatase: 60 U/L (ref 38–126)
Anion gap: 8 (ref 5–15)
BUN: 16 mg/dL (ref 8–23)
CO2: 26 mmol/L (ref 22–32)
Calcium: 9.3 mg/dL (ref 8.9–10.3)
Chloride: 101 mmol/L (ref 98–111)
Creatinine: 1.38 mg/dL — ABNORMAL HIGH (ref 0.61–1.24)
GFR, Estimated: 55 mL/min — ABNORMAL LOW (ref >60)
Glucose, Bld: 94 mg/dL (ref 70–99)
Potassium: 4.1 mmol/L (ref 3.5–5.1)
Sodium: 135 mmol/L (ref 135–145)
Total Bilirubin: 0.8 mg/dL (ref 0.3–1.2)
Total Protein: 8 g/dL (ref 6.5–8.1)

## 2021-09-13 LAB — CBC WITH DIFFERENTIAL (CANCER CENTER ONLY)
Abs Immature Granulocytes: 0.02 10*3/uL (ref 0.00–0.07)
Basophils Absolute: 0 10*3/uL (ref 0.0–0.1)
Basophils Relative: 0 %
Eosinophils Absolute: 0.1 10*3/uL (ref 0.0–0.5)
Eosinophils Relative: 1 %
HCT: 39.2 % (ref 39.0–52.0)
Hemoglobin: 13.5 g/dL (ref 13.0–17.0)
Immature Granulocytes: 0 %
Lymphocytes Relative: 27 %
Lymphs Abs: 1.5 10*3/uL (ref 0.7–4.0)
MCH: 29.2 pg (ref 26.0–34.0)
MCHC: 34.4 g/dL (ref 30.0–36.0)
MCV: 84.7 fL (ref 80.0–100.0)
Monocytes Absolute: 0.6 10*3/uL (ref 0.1–1.0)
Monocytes Relative: 10 %
Neutro Abs: 3.5 10*3/uL (ref 1.7–7.7)
Neutrophils Relative %: 62 %
Platelet Count: 236 10*3/uL (ref 150–400)
RBC: 4.63 MIL/uL (ref 4.22–5.81)
RDW: 14.3 % (ref 11.5–15.5)
WBC Count: 5.7 10*3/uL (ref 4.0–10.5)
nRBC: 0 % (ref 0.0–0.2)

## 2021-09-13 NOTE — Progress Notes (Signed)
?Hematology and Oncology Follow Up Visit ? ?Phillip Wall ?[id number] ?[birth year] 71 y.o. ?09/13/2021 ? ? ?Principle Diagnosis:  ?Bilateral subsegmental pulmonary emboli ? ?Current Therapy:   ?Xarelto 20 mg PO daily ?  ?Interim History:  Phillip Wall is here today with his wife for follow-up. He is doing quite well and has no complaints at this time.  ?He was able to see Dr. Windle Guard and has decided to have a partial nephrectomy likely in April.  ?He is tolerating Xarelto nicely. No bleeding, bruising or petechiae.  ?No fever, chills, n/v, cough, rash, dizziness, SOB, chest pain, palpitations, abdominal pain or changes in bowel or bladder habits.  ?No swelling, tenderness, numbness or tingling in his extremities.  ?No falls or syncope to report.  ?He has maintained a good appetite and is staying well hydrated. His weight is stable at 176 lbs.  ? ?ECOG Performance Status: 1 - Symptomatic but completely ambulatory ? ?Medications:  ?Allergies as of 09/13/2021   ?No Known Allergies ?  ? ?  ?Medication List  ?  ? ?  ? Accurate as of September 13, 2021  1:00 PM. If you have any questions, ask your nurse or doctor.  ?  ?  ? ?  ? ?acetaminophen 500 MG tablet ?Commonly known as: TYLENOL ?Take 500 mg by mouth every 6 (six) hours as needed for mild pain. ?  ?amLODipine 10 MG tablet ?Commonly known as: NORVASC ?TAKE 1 TABLET(10 MG) BY MOUTH DAILY ?  ?chlorpheniramine 4 MG tablet ?Commonly known as: CHLOR-TRIMETON ?Take 1 tablet (4 mg total) by mouth 2 (two) times daily as needed for allergies. ?  ?citalopram 40 MG tablet ?Commonly known as: CELEXA ?Take 1 tablet (40 mg total) by mouth daily. ?  ?famotidine 40 MG tablet ?Commonly known as: Pepcid ?Take one daily as needed for indigestion ?What changed:  ?how much to take ?how to take this ?when to take this ?reasons to take this ?additional instructions ?  ?multivitamin tablet ?Take 1 tablet by mouth daily. ?  ?sildenafil 20 MG tablet ?Commonly known as: Revatio ?Take 1-3 tablets 45  minutes prior... No more than 5 pills daily ?What changed:  ?how much to take ?how to take this ?when to take this ?  ?Vitamin D3 25 MCG (1000 UT) Caps ?Take 1,000 Units by mouth daily. ?  ?Xarelto 20 MG Tabs tablet ?Generic drug: rivaroxaban ?TAKE 1 TABLET BY MOUTH DAILY WITH SUPPER ?  ? ?  ? ? ?Allergies: No Known Allergies ? ?Past Medical History, Surgical history, Social history, and Family History were reviewed and updated. ? ?Review of Systems: ?All other 10 point review of systems is negative.  ? ?Physical Exam: ? vitals were not taken for this visit.  ? ?Wt Readings from Last 3 Encounters:  ?07/10/21 176 lb 6.4 oz (80 kg)  ?06/08/21 177 lb (80.3 kg)  ?05/23/21 177 lb 8 oz (80.5 kg)  ? ? ?Ocular: Sclerae unicteric, pupils equal, round and reactive to light ?Ear-nose-throat: Oropharynx clear, dentition fair ?Lymphatic: No cervical or supraclavicular adenopathy ?Lungs no rales or rhonchi, good excursion bilaterally ?Heart regular rate and rhythm, no murmur appreciated ?Abd soft, nontender, positive bowel sounds ?MSK no focal spinal tenderness, no joint edema ?Neuro: non-focal, well-oriented, appropriate affect ?Breasts: Deferred  ? ?Lab Results  ?Component Value Date  ? WBC 6.0 07/06/2021  ? HGB 13.6 07/06/2021  ? HCT 39.5 07/06/2021  ? MCV 83.2 07/06/2021  ? PLT 218 07/06/2021  ? ?No results found for: FERRITIN, IRON,  TIBC, UIBC, IRONPCTSAT ?Lab Results  ?Component Value Date  ? RBC 4.75 07/06/2021  ? ?No results found for: KPAFRELGTCHN, LAMBDASER, KAPLAMBRATIO ?No results found for: IGGSERUM, IGA, IGMSERUM ?Lab Results  ?Component Value Date  ? ALBUMINELP 4.8 05/10/2021  ? A1GS 0.3 05/10/2021  ? A2GS 0.8 05/10/2021  ? BETS 0.4 05/10/2021  ? BETA2SER 0.3 05/10/2021  ? GAMS 1.4 05/10/2021  ? SPEI  05/10/2021  ?   Comment:  ?   Normal Serum Protein Electrophoresis Pattern. ?No abnormal protein bands (M-protein) detected. ?  ? ?  Chemistry   ?   ?Component Value Date/Time  ? NA 141 07/06/2021 0734  ? NA 140  10/27/2016 0000  ? K 4.0 07/06/2021 0734  ? CL 108 07/06/2021 0734  ? CO2 26 07/06/2021 0734  ? BUN 16 07/06/2021 0734  ? BUN 14 10/27/2016 0000  ? CREATININE 1.36 (H) 07/06/2021 0734  ? GLU 81 10/27/2016 0000  ?    ?Component Value Date/Time  ? CALCIUM 9.3 07/06/2021 0734  ? ALKPHOS 65 07/06/2021 0734  ? AST 24 07/06/2021 0734  ? ALT 30 07/06/2021 0734  ? BILITOT 0.8 07/06/2021 0734  ?  ? ? ? ?Impression and Plan: Phillip Wall is a very pleasant 71 yo African American gentleman with new diagnosis of bilateral pulmonary emboli in August. He had positive lupus anticoagulant.  ?He continues to do well.  ?Follow-up in 3 months.  ? ?Lottie Dawson, NP ?3/15/20231:00 PM ? ?

## 2021-09-14 ENCOUNTER — Encounter: Payer: Self-pay | Admitting: *Deleted

## 2021-09-15 ENCOUNTER — Telehealth: Payer: Self-pay | Admitting: *Deleted

## 2021-09-15 LAB — LUPUS ANTICOAGULANT PANEL
DRVVT: 60 s — ABNORMAL HIGH (ref 0.0–47.0)
PTT Lupus Anticoagulant: 37.5 s (ref 0.0–43.5)

## 2021-09-15 LAB — DRVVT CONFIRM: dRVVT Confirm: 1.2 ratio (ref 0.8–1.2)

## 2021-09-15 LAB — DRVVT MIX: dRVVT Mix: 54.3 s — ABNORMAL HIGH (ref 0.0–40.4)

## 2021-09-15 NOTE — Telephone Encounter (Signed)
Per 09/13/21 los - called and lvm of upcoming appointments - requested call back to confirm. ?

## 2021-09-20 ENCOUNTER — Other Ambulatory Visit: Payer: Self-pay | Admitting: Urology

## 2021-09-20 ENCOUNTER — Encounter: Payer: Self-pay | Admitting: Family

## 2021-10-03 DIAGNOSIS — D49511 Neoplasm of unspecified behavior of right kidney: Secondary | ICD-10-CM | POA: Diagnosis not present

## 2021-10-17 NOTE — Progress Notes (Signed)
DUE TO COVID-19 ONLY  2v VISITOR IS ALLOWED TO COME WITH YOU AND STAY IN THE WAITING ROOM ONLY DURING PRE OP AND PROCEDURE DAY OF SURGERY.   4  VISITOR  MAY VISIT WITH YOU AFTER SURGERY IN YOUR PRIVATE ROOM DURING VISITING HOURS ONLY! ?YOU MAY HAVE ONE PERSON SPEND THE NITE WITH YOU IN YOUR ROOM AFTER SURGERY.   ? ? Your procedure is scheduled on:  ?            10/30/2021  ? Report to [facility] Main  Entrance ? ? Report to admitting at     0515            AM ?DO NOT [place], PICTURE ID OR WALLET DAY OF SURGERY.  ?  Clear liquid diet the day before surgery.  Dissolve 17g of Miralax in 4 ounces of water and drink at 12 noon day before surgery.   ? ? Call this number if you have problems the morning of surgery [phone]  ? ? REMEMBER: NO  SOLID FOODS , CANDY, GUM OR MINTS AFTER MIDNITE THE NITE BEFORE SURGERY .       [facility] Kitchen CLEAR LIQUIDS UNTIL       0415am          DAY OF SURGERY.     .   ? ? ? ? ?CLEAR LIQUID DIET ? ? ?Foods Allowed      ?WATER ?BLACK COFFEE ( SUGAR OK, NO MILK, CREAM OR CREAMER) REGULAR AND DECAF  ?TEA ( SUGAR OK NO MILK, CREAM, OR CREAMER) REGULAR AND DECAF  ?PLAIN JELLO ( NO RED)  ?FRUIT ICES ( NO RED, NO FRUIT PULP)  ?POPSICLES ( NO RED)  ?JUICE- APPLE, WHITE GRAPE AND WHITE CRANBERRY  ?SPORT DRINK LIKE GATORADE ( NO RED)  ?CLEAR BROTH ( VEGETABLE , CHICKEN OR BEEF)                                                               ? ?    ? ?BRUSH YOUR TEETH MORNING OF SURGERY AND RINSE YOUR MOUTH OUT, NO CHEWING GUM CANDY OR MINTS. ?  ? ? Take these medicines the morning of surgery with A SIP OF WATER:  amlodipine  ? ? ?DO NOT TAKE ANY DIABETIC MEDICATIONS DAY OF YOUR SURGERY ?                  ?            You may not have any metal on your body including hair pins and  ?            piercings  Do not wear jewelry, make-up, lotions, powders or perfumes, deodorant ?            Do not wear nail polish on your fingernails.   ?           IF YOU ARE A MALE AND WANT TO SHAVE UNDER ARMS  OR LEGS PRIOR TO SURGERY YOU MUST DO SO AT LEAST 48 HOURS PRIOR TO SURGERY.  ?            Men may shave face and neck. ? ? Do not bring valuables to the hospital. [facility] NOT ?  RESPONSIBLE   FOR VALUABLES. ? Contacts, dentures or bridgework may not be worn into surgery. ? Leave suitcase in the car. After surgery it may be brought to your room. ? ?  ? Patients discharged the day of surgery will not be allowed to drive home. IF YOU ARE HAVING SURGERY AND GOING HOME THE SAME DAY, YOU MUST HAVE AN ADULT TO DRIVE YOU HOME AND BE WITH YOU FOR 24 HOURS. YOU MAY GO HOME BY TAXI OR UBER OR ORTHERWISE, BUT AN ADULT MUST ACCOMPANY YOU HOME AND STAY WITH YOU FOR 24 HOURS. ?  ? ?            Please read over the following fact sheets you were given: ?_____________________________________________________________________ ? ?[facility] - Preparing for Surgery ?Before surgery, you can play an important role.  Because skin is not sterile, your skin needs to be as free of germs as possible.  You can reduce the number of germs on your skin by washing with CHG (chlorahexidine gluconate) soap before surgery.  CHG is an antiseptic cleaner which kills germs and bonds with the skin to continue killing germs even after washing. ?Please DO NOT use if you have an allergy to CHG or antibacterial soaps.  If your skin becomes reddened/irritated stop using the CHG and inform your nurse when you arrive at Short Stay. ?Do not shave (including legs and underarms) for at least 48 hours prior to the first CHG shower.  You may shave your face/neck. ?Please follow these instructions carefully: ? 1.  Shower with CHG Soap the night before surgery and the  morning of Surgery. ? 2.  If you choose to wash your hair, wash your hair first as usual with your  normal  shampoo. ? 3.  After you shampoo, rinse your hair and body thoroughly to remove the  shampoo.                           4.  Use CHG as you would any other liquid soap.  You can  apply chg directly  to the skin and wash  ?                     Gently with a scrungie or clean washcloth. ? 5.  Apply the CHG Soap to your body ONLY FROM THE NECK DOWN.   Do not use on face/ open      ?                     Wound or open sores. Avoid contact with eyes, ears mouth and genitals (private parts).  ?                     Production manager,  Genitals (private parts) with your normal soap. ?            6.  Wash thoroughly, paying special attention to the area where your surgery  will be performed. ? 7.  Thoroughly rinse your body with warm water from the neck down. ? 8.  DO NOT shower/wash with your normal soap after using and rinsing off  the CHG Soap. ?               9.  Pat yourself dry with a clean towel. ?           10.  Wear clean pajamas. ?  11.  Place clean sheets on your bed the night of your first shower and do not  sleep with pets. ?Day of Surgery : ?Do not apply any lotions/deodorants the morning of surgery.  Please wear clean clothes to the hospital/surgery center. ? ?FAILURE TO FOLLOW THESE INSTRUCTIONS MAY RESULT IN THE CANCELLATION OF YOUR SURGERY ?PATIENT SIGNATURE_________________________________ ? ?NURSE SIGNATURE__________________________________ ? ?________________________________________________________________________  ? ? ?           ?

## 2021-10-17 NOTE — Progress Notes (Addendum)
Anesthesia Review: ? ?PCP: Dr Abelino Derrick  ?Cardiologist : none  ?Chest x-ray : ?EKG : 02/20/21  ?Ct angio chest- 07/06/21  ?Echo : 02/20/21  ?Stress test: ?Cardiac Cath :  ?Activity level: can do ao flgiht of stairs without difficulty  ?Sleep Study/ CPAP : none  ?Fasting Blood Sugar :      / Checks Blood Sugar -- times a day:   ?Blood Thinner/ Instructions /Last Dose: ?ASA / Instructions/ Last Dose :   ?Xarelto-  stop 48 hours prior to surgery per pt  ?

## 2021-10-19 ENCOUNTER — Encounter (HOSPITAL_COMMUNITY)
Admission: RE | Admit: 2021-10-19 | Discharge: 2021-10-19 | Disposition: A | Discharge status: Home / Self Care | Payer: Medicare Other | Source: Ambulatory Visit | Attending: Urology | Admitting: Urology

## 2021-10-19 ENCOUNTER — Other Ambulatory Visit: Payer: Self-pay

## 2021-10-19 ENCOUNTER — Encounter (HOSPITAL_COMMUNITY): Payer: Self-pay

## 2021-10-19 DIAGNOSIS — Z01812 Encounter for preprocedural laboratory examination: Secondary | ICD-10-CM | POA: Insufficient documentation

## 2021-10-19 HISTORY — DX: Gastro-esophageal reflux disease without esophagitis: K21.9

## 2021-10-19 LAB — BASIC METABOLIC PANEL
Anion gap: 5 (ref 5–15)
BUN: 16 mg/dL (ref 8–23)
CO2: 26 mmol/L (ref 22–32)
Calcium: 9 mg/dL (ref 8.9–10.3)
Chloride: 108 mmol/L (ref 98–111)
Creatinine, Ser: 1.4 mg/dL — ABNORMAL HIGH (ref 0.61–1.24)
GFR, Estimated: 54 mL/min — ABNORMAL LOW (ref >60)
Glucose, Bld: 93 mg/dL (ref 70–99)
Potassium: 4.5 mmol/L (ref 3.5–5.1)
Sodium: 139 mmol/L (ref 135–145)

## 2021-10-19 LAB — CBC
HCT: 38 % — ABNORMAL LOW (ref 39.0–52.0)
Hemoglobin: 13.1 g/dL (ref 13.0–17.0)
MCH: 29.3 pg (ref 26.0–34.0)
MCHC: 34.5 g/dL (ref 30.0–36.0)
MCV: 85 fL (ref 80.0–100.0)
Platelets: 194 10*3/uL (ref 150–400)
RBC: 4.47 MIL/uL (ref 4.22–5.81)
RDW: 14.4 % (ref 11.5–15.5)
WBC: 6.3 10*3/uL (ref 4.0–10.5)
nRBC: 0 % (ref 0.0–0.2)

## 2021-10-19 LAB — TYPE AND SCREEN
ABO/RH(D): A POS
Antibody Screen: NEGATIVE

## 2021-10-24 ENCOUNTER — Other Ambulatory Visit: Payer: Self-pay | Admitting: Family

## 2021-10-24 ENCOUNTER — Encounter: Payer: Self-pay | Admitting: Family Medicine

## 2021-10-24 ENCOUNTER — Encounter: Payer: Self-pay | Admitting: Family

## 2021-10-27 NOTE — H&P (Signed)
? ? ?Office Visit Report     10/03/2021  ? ?-------------------------------------------------------------------------------- ?  ?Phillip Wall  ?MRN: [id number]  ?DOB: 1951/05/04, 71 year old Male  ?SSN:   ? PRIMARY CARE:  Abelino Derrick  ?REFERRING:  Luvenia Redden  ?PROVIDER:  [name], M.D.  ?TREATING:  Mcarthur Rossetti, PA  ?LOCATION:  [facility] [phone]  ?  ? ?-------------------------------------------------------------------------------- ?  ?CC/HPI: Pt presents today for pre-operative history and physical exam in anticipation of robotic assisted lap right partial nephrectomy by Dr. [name] on 10/30/21. He is doing well and is without complaint.  ? ?Pt denies F/C, HA, CP, SOB, N/V, diarrhea/constipation, back pain, flank pain, hematuria, and dysuria.  ? ? ?HX:  ? ? ?Right renal neoplasm  ? ?Mr. [name] is a pleasant 71 year old gentleman who was diagnosed with acute pulmonary embolism last August. There is no clear explanation for why this happened except that he did have a COVID-19 infection in June. He was treated with anticoagulation for 6 months. A follow-up CT angiogram in January indicated resolution of his emboli with some expected treatment related changes. Incidentally, he was noted to have a small enhancing renal mass. His CT scan of the chest also identified a 1.4 cm subpleural nodule in the posterior right base that may be related to his pulmonary infarct although it was recommended that he have follow-up imaging of the chest in the future. An MR of the abdomen with and without contrast was then performed and confirmed an enhancing 1.3 cm mass off the lateral aspect of the upper pole of the right kidney. On independent review of his MRI, this lesion is not easily accessible percutaneously as the liver does drape over the upper pole of the kidney. Although a biopsy or percutaneous ablation are likely possible with saline dissection of the liver, there would  likely be some increased risk with this approach. There is no regional lymphadenopathy or other evidence of metastatic disease noted. He does have a history of hypertension. His serum creatinine at baseline is 1.3. Liver function tests are normal.  ? ?  ?ALLERGIES: No Known Drug Allergies ?  ? ?MEDICATIONS: Amlodipine Besylate 10 mg tablet  ?Famotidine 40 mg tablet  ?Xarelto 20 mg tablet  ?  ? ?GU PSH: None  ? ?NON-GU PSH: None  ? ?GU PMH: Right renal neoplasm - 09/05/2021 ?  ? ?NON-GU PMH: GERD ?Hypertension ?Pulmonary Embolism, History ?  ? ?FAMILY HISTORY: 2 daughters - Runs in Family ?1 son - Runs in Family ?Prostate Cancer - Brother  ? ?SOCIAL HISTORY: Marital Status: Married ?Ethnicity: Not Hispanic Or Latino; Race: Black or African American ?Current Smoking Status: Patient has never smoked.  ? ?Tobacco Use Assessment Completed: Used Tobacco in last 30 days? ?Does not use smokeless tobacco. ?Has never drank.  ?Does not use drugs. ?Does not drink caffeine. ?Has not had a blood transfusion. ?  ? ?REVIEW OF SYSTEMS:    ?GU Review Male:   Patient denies frequent urination, hard to postpone urination, burning/ pain with urination, get up at night to urinate, leakage of urine, stream starts and stops, trouble starting your stream, have to strain to urinate , erection problems, and penile pain.  ?Gastrointestinal (Upper):   Patient denies nausea, vomiting, and indigestion/ heartburn.  ?Gastrointestinal (Lower):   Patient denies diarrhea and constipation.  ?Constitutional:   Patient denies fever, night sweats, weight loss, and fatigue.  ?Skin:   Patient denies skin rash/ lesion and itching.  ?  Eyes:   Patient denies blurred vision and double vision.  ?Ears/ Nose/ Throat:   Patient denies sore throat and sinus problems.  ?Hematologic/Lymphatic:   Patient denies swollen glands and easy bruising.  ?Cardiovascular:   Patient denies leg swelling and chest pains.  ?Respiratory:   Patient denies cough and shortness of breath.   ?Endocrine:   Patient denies excessive thirst.  ?Musculoskeletal:   Patient denies back pain and joint pain.  ?Neurological:   Patient denies headaches and dizziness.  ?Psychologic:   Patient denies depression and anxiety.  ? ?VITAL SIGNS:    ?  10/03/2021 03:08 PM  ?Weight 177 lb / 80.29 kg  ?Height 73 in / 185.42 cm  ?BP 160/78 mmHg  ?Pulse 84 /min  ?Temperature 97.7 F / 36.5 C  ?BMI 23.3 kg/m?  ? ?MULTI-SYSTEM PHYSICAL EXAMINATION:    ?Constitutional: Well-nourished. No physical deformities. Normally developed. Good grooming.  ?Neck: Neck symmetrical, not swollen. Normal tracheal position.  ?Respiratory: Normal breath sounds. No labored breathing, no use of accessory muscles.   ?Cardiovascular: Regular rate and rhythm. No murmur, no gallop.   ?Lymphatic: No enlargement of neck, axillae, groin.  ?Skin: No paleness, no jaundice, no cyanosis. No lesion, no ulcer, no rash.  ?Neurologic / Psychiatric: Oriented to time, oriented to place, oriented to person. No depression, no anxiety, no agitation.  ?Gastrointestinal: No mass, no tenderness, no rigidity, non obese abdomen.  ?Eyes: Normal conjunctivae. Normal eyelids.  ?Ears, Nose, Mouth, and Throat: Left ear no scars, no lesions, no masses. Right ear no scars, no lesions, no masses. Nose no scars, no lesions, no masses. Normal hearing. Normal lips.  ?Musculoskeletal: Normal gait and station of head and neck.  ? ?  ?Complexity of Data:  ?Records Review:   Previous Patient Records  ?Urine Test Review:   Urinalysis  ? 10/03/21  ?Urinalysis  ?Urine Appearance Clear   ?Urine Color Yellow   ?Urine Glucose Neg mg/dL  ?Urine Bilirubin Neg mg/dL  ?Urine Ketones Neg mg/dL  ?Urine Specific Gravity 1.015   ?Urine Blood Neg ery/uL  ?Urine pH <=5.0   ?Urine Protein Neg mg/dL  ?Urine Urobilinogen 0.2 mg/dL  ?Urine Nitrites Neg   ?Urine Leukocyte Esterase Neg leu/uL  ? ?PROCEDURES:    ? ?     Urinalysis - 81003 ?Dipstick Dipstick Cont'd  ?Color: Yellow Bilirubin: Neg mg/dL   ?Appearance: Clear Ketones: Neg mg/dL  ?Specific Gravity: 1.015 Blood: Neg ery/uL  ?pH: <=5.0 Protein: Neg mg/dL  ?Glucose: Neg mg/dL Urobilinogen: 0.2 mg/dL  ?  Nitrites: Neg  ?  Leukocyte Esterase: Neg leu/uL  ? ? ?ASSESSMENT:  ?    ICD-10 Details  ?1 GU:   Right renal neoplasm - D49.511   ? ?PLAN:    ? ?      Schedule ?Return Visit/Planned Activity: Keep Scheduled Appointment - Schedule Surgery  ? ? ?      Document ?Letter(s):  Created for Patient: Clinical Summary  ? ? ?     Notes:   There are no changes in the patients history or physical exam since last evaluation by Dr. [name]. Pt is scheduled to undergo RAL right partial nephrectomy on 10/30/21.  ? ?All pt's questions were answered to the best of my ability.  ?  ? ?     Next Appointment:    ?  Next Appointment: 10/30/2021 07:15 AM  ?  Appointment Type: Surgery   ?  Location: [facility] [phone]  ?  Provider: Wynetta Emery  [name], M.D.  ?  Reason for Visit: [facility]/EXT REC RT RAL LAP PARTIAL NEPHRECTOMY WITH AMANDA  ?  ? ? ?* Signed by Mcarthur Rossetti, PA on 10/03/21 at 3:27 PM (EDT)* ?    ?

## 2021-10-29 NOTE — Anesthesia Preprocedure Evaluation (Addendum)
Anesthesia Evaluation  ?Patient identified by MRN, date of birth, ID band ?Patient awake ? ? ? ?Reviewed: ?Allergy & Precautions, NPO status , Patient's Chart, lab work & pertinent test results ? ?Airway ?Mallampati: I ? ?TM Distance: >3 FB ?Neck ROM: Full ? ? ? Dental ?no notable dental hx. ? ?  ?Pulmonary ?PE (on Xarelto) ?  ?Pulmonary exam normal ? ? ? ? ? ? ? Cardiovascular ?hypertension, Pt. on medications ?Normal cardiovascular exam ? ? ?  ?Neuro/Psych ?PSYCHIATRIC DISORDERS Anxiety Depression   ? GI/Hepatic ?GERD  Medicated and Controlled,  ?Endo/Other  ? ? Renal/GU ?Renal disease  ? ?  ?Musculoskeletal ? ? Abdominal ?  ?Peds ? Hematology ?  ?Anesthesia Other Findings ?RIGHT RENAL NEOPLASM ? Reproductive/Obstetrics ? ?  ? ? ? ? ? ? ? ? ? ? ? ? ? ?  ?  ? ? ? ? ? ? ? ?Anesthesia Physical ?Anesthesia Plan ? ?ASA: 3 ? ?Anesthesia Plan: General  ? ?Post-op Pain Management:   ? ?Induction: Intravenous ? ?PONV Risk Score and Plan: 2 and Ondansetron, Dexamethasone, Treatment may vary due to age or medical condition and Midazolam ? ?Airway Management Planned: Oral ETT ? ?Additional Equipment:  ? ?Intra-op Plan:  ? ?Post-operative Plan: Extubation in OR ? ?Informed Consent: I have reviewed the patients History and Physical, chart, labs and discussed the procedure including the risks, benefits and alternatives for the proposed anesthesia with the patient or authorized representative who has indicated his/her understanding and acceptance.  ? ? ? ?Dental advisory given ? ?Plan Discussed with: CRNA ? ?Anesthesia Plan Comments:   ? ? ? ? ? ?Anesthesia Quick Evaluation ? ?

## 2021-10-30 ENCOUNTER — Encounter (HOSPITAL_COMMUNITY): Payer: Self-pay | Admitting: Urology

## 2021-10-30 ENCOUNTER — Ambulatory Visit (HOSPITAL_BASED_OUTPATIENT_CLINIC_OR_DEPARTMENT_OTHER): Payer: Medicare Other | Admitting: Anesthesiology

## 2021-10-30 ENCOUNTER — Encounter (HOSPITAL_COMMUNITY)
Admission: RE | Disposition: A | Discharge status: Home / Self Care | Payer: Self-pay | Source: Ambulatory Visit | Attending: Urology

## 2021-10-30 ENCOUNTER — Observation Stay (HOSPITAL_COMMUNITY)
Admission: RE | Admit: 2021-10-30 | Discharge: 2021-10-31 | Disposition: A | Discharge status: Home / Self Care | Payer: Medicare Other | Source: Ambulatory Visit | Attending: Urology | Admitting: Urology

## 2021-10-30 ENCOUNTER — Ambulatory Visit (HOSPITAL_COMMUNITY): Payer: Medicare Other | Admitting: Physician Assistant

## 2021-10-30 ENCOUNTER — Other Ambulatory Visit: Payer: Self-pay

## 2021-10-30 ENCOUNTER — Telehealth: Payer: Self-pay | Admitting: Family Medicine

## 2021-10-30 DIAGNOSIS — Z86711 Personal history of pulmonary embolism: Secondary | ICD-10-CM | POA: Insufficient documentation

## 2021-10-30 DIAGNOSIS — D3001 Benign neoplasm of right kidney: Principal | ICD-10-CM | POA: Insufficient documentation

## 2021-10-30 DIAGNOSIS — I2699 Other pulmonary embolism without acute cor pulmonale: Secondary | ICD-10-CM

## 2021-10-30 DIAGNOSIS — Z8616 Personal history of COVID-19: Secondary | ICD-10-CM | POA: Insufficient documentation

## 2021-10-30 DIAGNOSIS — D49511 Neoplasm of unspecified behavior of right kidney: Secondary | ICD-10-CM

## 2021-10-30 DIAGNOSIS — I10 Essential (primary) hypertension: Secondary | ICD-10-CM

## 2021-10-30 DIAGNOSIS — F418 Other specified anxiety disorders: Secondary | ICD-10-CM | POA: Diagnosis not present

## 2021-10-30 DIAGNOSIS — Z7901 Long term (current) use of anticoagulants: Secondary | ICD-10-CM | POA: Diagnosis not present

## 2021-10-30 HISTORY — PX: ROBOTIC ASSITED PARTIAL NEPHRECTOMY: SHX6087

## 2021-10-30 LAB — BASIC METABOLIC PANEL
Anion gap: 10 (ref 5–15)
BUN: 22 mg/dL (ref 8–23)
CO2: 20 mmol/L — ABNORMAL LOW (ref 22–32)
Calcium: 8.7 mg/dL — ABNORMAL LOW (ref 8.9–10.3)
Chloride: 106 mmol/L (ref 98–111)
Creatinine, Ser: 1.32 mg/dL — ABNORMAL HIGH (ref 0.61–1.24)
GFR, Estimated: 58 mL/min — ABNORMAL LOW (ref >60)
Glucose, Bld: 146 mg/dL — ABNORMAL HIGH (ref 70–99)
Potassium: 3.5 mmol/L (ref 3.5–5.1)
Sodium: 136 mmol/L (ref 135–145)

## 2021-10-30 LAB — HEMOGLOBIN AND HEMATOCRIT, BLOOD
HCT: 35.5 % — ABNORMAL LOW (ref 39.0–52.0)
Hemoglobin: 12.4 g/dL — ABNORMAL LOW (ref 13.0–17.0)

## 2021-10-30 LAB — ABO/RH: ABO/RH(D): A POS

## 2021-10-30 SURGERY — NEPHRECTOMY, PARTIAL, ROBOT-ASSISTED
Anesthesia: General | Site: Abdomen | Laterality: Right

## 2021-10-30 MED ORDER — FENTANYL CITRATE (PF) 100 MCG/2ML IJ SOLN
INTRAMUSCULAR | Status: AC
  Filled 2021-10-30: qty 2

## 2021-10-30 MED ORDER — DIPHENHYDRAMINE HCL 12.5 MG/5ML PO ELIX: 12.5000 mg | ORAL_SOLUTION | Freq: Four times a day (QID) | ORAL | Status: DC | PRN

## 2021-10-30 MED ORDER — ONDANSETRON HCL 4 MG/2ML IJ SOLN
4.0000 mg | INTRAMUSCULAR | Status: DC | PRN
  Administered 2021-10-30: 4 mg via INTRAVENOUS
  Filled 2021-10-30: qty 2

## 2021-10-30 MED ORDER — ACETAMINOPHEN 500 MG PO TABS
1000.0000 mg | ORAL_TABLET | Freq: Once | ORAL | Status: AC
  Administered 2021-10-30: 1000 mg via ORAL
  Filled 2021-10-30: qty 2

## 2021-10-30 MED ORDER — TRAMADOL HCL 50 MG PO TABS: 50.0000 mg | ORAL_TABLET | Freq: Four times a day (QID) | ORAL | 0 refills | Status: DC | PRN

## 2021-10-30 MED ORDER — BUPIVACAINE LIPOSOME 1.3 % IJ SUSP
INTRAMUSCULAR | Status: DC | PRN
  Administered 2021-10-30: 20 mL

## 2021-10-30 MED ORDER — DEXAMETHASONE SODIUM PHOSPHATE 10 MG/ML IJ SOLN
INTRAMUSCULAR | Status: DC | PRN
  Administered 2021-10-30: 10 mg via INTRAVENOUS

## 2021-10-30 MED ORDER — FAMOTIDINE 20 MG PO TABS: 40.0000 mg | ORAL_TABLET | Freq: Every day | ORAL | Status: DC | PRN

## 2021-10-30 MED ORDER — LACTATED RINGERS IV SOLN: INTRAVENOUS | Status: DC | PRN

## 2021-10-30 MED ORDER — STERILE WATER FOR IRRIGATION IR SOLN
Status: DC | PRN
  Administered 2021-10-30: 1000 mL

## 2021-10-30 MED ORDER — AMLODIPINE BESYLATE 10 MG PO TABS
10.0000 mg | ORAL_TABLET | Freq: Every day | ORAL | Status: DC
  Administered 2021-10-31: 10 mg via ORAL
  Filled 2021-10-30: qty 1

## 2021-10-30 MED ORDER — LACTATED RINGERS IR SOLN
Status: DC | PRN
  Administered 2021-10-30: 1000 mL

## 2021-10-30 MED ORDER — CHLORHEXIDINE GLUCONATE 0.12 % MT SOLN
15.0000 mL | Freq: Two times a day (BID) | OROMUCOSAL | Status: DC
  Administered 2021-10-30 – 2021-10-31 (×2): 15 mL via OROMUCOSAL
  Filled 2021-10-30 (×2): qty 15

## 2021-10-30 MED ORDER — MORPHINE SULFATE (PF) 2 MG/ML IV SOLN
2.0000 mg | INTRAVENOUS | Status: DC | PRN
  Administered 2021-10-30 (×2): 2 mg via INTRAVENOUS
  Filled 2021-10-30 (×2): qty 1

## 2021-10-30 MED ORDER — CEFAZOLIN SODIUM-DEXTROSE 2-4 GM/100ML-% IV SOLN
2.0000 g | Freq: Once | INTRAVENOUS | Status: AC
Start: 2021-10-30 — End: 2021-10-30
  Administered 2021-10-30: 2 g via INTRAVENOUS
  Filled 2021-10-30: qty 100

## 2021-10-30 MED ORDER — POLYVINYL ALCOHOL 1.4 % OP SOLN
1.0000 [drp] | OPHTHALMIC | Status: DC | PRN
  Administered 2021-10-30: 1 [drp] via OPHTHALMIC
  Filled 2021-10-30: qty 15

## 2021-10-30 MED ORDER — FENTANYL CITRATE PF 50 MCG/ML IJ SOSY: 25.0000 ug | PREFILLED_SYRINGE | INTRAMUSCULAR | Status: DC | PRN

## 2021-10-30 MED ORDER — MIDAZOLAM HCL 2 MG/2ML IJ SOLN
INTRAMUSCULAR | Status: AC
  Filled 2021-10-30: qty 2

## 2021-10-30 MED ORDER — SUGAMMADEX SODIUM 200 MG/2ML IV SOLN
INTRAVENOUS | Status: DC | PRN
Start: 2021-10-30 — End: 2021-10-30
  Administered 2021-10-30: 300 mg via INTRAVENOUS

## 2021-10-30 MED ORDER — SODIUM CHLORIDE (PF) 0.9 % IJ SOLN
INTRAMUSCULAR | Status: DC | PRN
  Administered 2021-10-30: 20 mL

## 2021-10-30 MED ORDER — FENTANYL CITRATE (PF) 100 MCG/2ML IJ SOLN
INTRAMUSCULAR | Status: DC | PRN
Start: 2021-10-30 — End: 2021-10-30
  Administered 2021-10-30 (×2): 50 ug via INTRAVENOUS

## 2021-10-30 MED ORDER — PROPOFOL 10 MG/ML IV BOLUS
INTRAVENOUS | Status: AC
  Filled 2021-10-30: qty 20

## 2021-10-30 MED ORDER — BUPIVACAINE LIPOSOME 1.3 % IJ SUSP
INTRAMUSCULAR | Status: AC
  Filled 2021-10-30: qty 20

## 2021-10-30 MED ORDER — AMISULPRIDE (ANTIEMETIC) 5 MG/2ML IV SOLN
INTRAVENOUS | Status: AC
Start: 2021-10-30 — End: 2021-10-30
  Filled 2021-10-30: qty 4

## 2021-10-30 MED ORDER — PHENYLEPHRINE 80 MCG/ML (10ML) SYRINGE FOR IV PUSH (FOR BLOOD PRESSURE SUPPORT)
PREFILLED_SYRINGE | INTRAVENOUS | Status: DC | PRN
  Administered 2021-10-30: 400 ug via INTRAVENOUS

## 2021-10-30 MED ORDER — ORAL CARE MOUTH RINSE: 15.0000 mL | Freq: Once | OROMUCOSAL | Status: AC

## 2021-10-30 MED ORDER — PROPOFOL 10 MG/ML IV BOLUS
INTRAVENOUS | Status: DC | PRN
  Administered 2021-10-30: 200 mg via INTRAVENOUS

## 2021-10-30 MED ORDER — PHENYLEPHRINE HCL (PRESSORS) 10 MG/ML IV SOLN
INTRAVENOUS | Status: AC
  Filled 2021-10-30: qty 1

## 2021-10-30 MED ORDER — SODIUM CHLORIDE (PF) 0.9 % IJ SOLN
INTRAMUSCULAR | Status: AC
  Filled 2021-10-30: qty 20

## 2021-10-30 MED ORDER — CEFAZOLIN SODIUM-DEXTROSE 1-4 GM/50ML-% IV SOLN
1.0000 g | Freq: Three times a day (TID) | INTRAVENOUS | Status: AC
  Administered 2021-10-30 – 2021-10-31 (×2): 1 g via INTRAVENOUS
  Filled 2021-10-30 (×2): qty 50

## 2021-10-30 MED ORDER — LIDOCAINE 2% (20 MG/ML) 5 ML SYRINGE
INTRAMUSCULAR | Status: DC | PRN
Start: 2021-10-30 — End: 2021-10-30
  Administered 2021-10-30: 60 mg via INTRAVENOUS
  Administered 2021-10-30: 40 mg via INTRAVENOUS

## 2021-10-30 MED ORDER — ORAL CARE MOUTH RINSE: 15.0000 mL | Freq: Two times a day (BID) | OROMUCOSAL | Status: DC

## 2021-10-30 MED ORDER — DOCUSATE SODIUM 100 MG PO CAPS: 100.0000 mg | ORAL_CAPSULE | Freq: Two times a day (BID) | ORAL | Status: DC

## 2021-10-30 MED ORDER — FENTANYL CITRATE (PF) 250 MCG/5ML IJ SOLN
INTRAMUSCULAR | Status: AC
  Filled 2021-10-30: qty 5

## 2021-10-30 MED ORDER — CHLORHEXIDINE GLUCONATE 0.12 % MT SOLN
15.0000 mL | Freq: Once | OROMUCOSAL | Status: AC
  Administered 2021-10-30: 15 mL via OROMUCOSAL

## 2021-10-30 MED ORDER — ONDANSETRON HCL 4 MG/2ML IJ SOLN: 4.0000 mg | Freq: Once | INTRAMUSCULAR | Status: DC | PRN

## 2021-10-30 MED ORDER — FENTANYL CITRATE (PF) 250 MCG/5ML IJ SOLN
INTRAMUSCULAR | Status: DC | PRN
  Administered 2021-10-30 (×2): 50 ug via INTRAVENOUS
  Administered 2021-10-30: 100 ug via INTRAVENOUS
  Administered 2021-10-30: 50 ug via INTRAVENOUS

## 2021-10-30 MED ORDER — AMISULPRIDE (ANTIEMETIC) 5 MG/2ML IV SOLN
10.0000 mg | Freq: Once | INTRAVENOUS | Status: AC | PRN
  Administered 2021-10-30: 10 mg via INTRAVENOUS

## 2021-10-30 MED ORDER — POLYETHYLENE GLYCOL 3350 17 G PO PACK
17.0000 g | PACK | Freq: Every day | ORAL | Status: DC
  Filled 2021-10-30: qty 1

## 2021-10-30 MED ORDER — ROCURONIUM BROMIDE 10 MG/ML (PF) SYRINGE
PREFILLED_SYRINGE | INTRAVENOUS | Status: DC | PRN
  Administered 2021-10-30: 60 mg via INTRAVENOUS
  Administered 2021-10-30: 40 mg via INTRAVENOUS

## 2021-10-30 MED ORDER — MIDAZOLAM HCL 2 MG/2ML IJ SOLN
INTRAMUSCULAR | Status: DC | PRN
Start: 2021-10-30 — End: 2021-10-30
  Administered 2021-10-30 (×2): 1 mg via INTRAVENOUS

## 2021-10-30 MED ORDER — LACTATED RINGERS IV SOLN: INTRAVENOUS | Status: DC

## 2021-10-30 MED ORDER — PHENYLEPHRINE HCL-NACL 20-0.9 MG/250ML-% IV SOLN
INTRAVENOUS | Status: DC | PRN
  Administered 2021-10-30: 75 ug/min via INTRAVENOUS

## 2021-10-30 MED ORDER — HYOSCYAMINE SULFATE 0.125 MG SL SUBL
0.1250 mg | SUBLINGUAL_TABLET | Freq: Four times a day (QID) | SUBLINGUAL | Status: DC | PRN
  Filled 2021-10-30: qty 1

## 2021-10-30 MED ORDER — ONDANSETRON HCL 4 MG/2ML IJ SOLN
INTRAMUSCULAR | Status: DC | PRN
  Administered 2021-10-30: 4 mg via INTRAVENOUS

## 2021-10-30 MED ORDER — DIPHENHYDRAMINE HCL 50 MG/ML IJ SOLN: 12.5000 mg | Freq: Four times a day (QID) | INTRAMUSCULAR | Status: DC | PRN

## 2021-10-30 MED ORDER — DEXTROSE-NACL 5-0.45 % IV SOLN: INTRAVENOUS | Status: DC

## 2021-10-30 MED ORDER — DOCUSATE SODIUM 100 MG PO CAPS
100.0000 mg | ORAL_CAPSULE | Freq: Two times a day (BID) | ORAL | Status: DC
  Administered 2021-10-30 – 2021-10-31 (×2): 100 mg via ORAL
  Filled 2021-10-30 (×2): qty 1

## 2021-10-30 MED ORDER — ACETAMINOPHEN 10 MG/ML IV SOLN
1000.0000 mg | Freq: Four times a day (QID) | INTRAVENOUS | Status: AC
Start: 2021-10-30 — End: 2021-10-31
  Administered 2021-10-30 – 2021-10-31 (×4): 1000 mg via INTRAVENOUS
  Filled 2021-10-30 (×4): qty 100

## 2021-10-30 SURGICAL SUPPLY — 57 items
APPLICATOR SURGIFLO ENDO (HEMOSTASIS) ×1 IMPLANT
BAG COUNTER SPONGE SURGICOUNT (BAG) IMPLANT
BAG RETRIEVAL 10 (BASKET) ×1
CHLORAPREP W/TINT 26 (MISCELLANEOUS) ×2 IMPLANT
CLIP LIGATING HEM O LOK PURPLE (MISCELLANEOUS) ×2 IMPLANT
CLIP LIGATING HEMO O LOK GREEN (MISCELLANEOUS) ×4 IMPLANT
COVER SURGICAL LIGHT HANDLE (MISCELLANEOUS) ×2 IMPLANT
COVER TIP SHEARS 8 DVNC (MISCELLANEOUS) ×1 IMPLANT
COVER TIP SHEARS 8MM DA VINCI (MISCELLANEOUS) ×2
DERMABOND ADVANCED (GAUZE/BANDAGES/DRESSINGS) ×2
DERMABOND ADVANCED .7 DNX12 (GAUZE/BANDAGES/DRESSINGS) ×2 IMPLANT
DRAIN CHANNEL 15F RND FF 3/16 (WOUND CARE) ×2 IMPLANT
DRAPE ARM DVNC X/XI (DISPOSABLE) ×4 IMPLANT
DRAPE COLUMN DVNC XI (DISPOSABLE) ×1 IMPLANT
DRAPE DA VINCI XI ARM (DISPOSABLE) ×8
DRAPE DA VINCI XI COLUMN (DISPOSABLE) ×2
DRAPE INCISE IOBAN 66X45 STRL (DRAPES) ×2 IMPLANT
DRAPE SHEET LG 3/4 BI-LAMINATE (DRAPES) ×2 IMPLANT
ELECT PENCIL ROCKER SW 15FT (MISCELLANEOUS) ×2 IMPLANT
ELECT REM PT RETURN 15FT ADLT (MISCELLANEOUS) ×2 IMPLANT
EVACUATOR SILICONE 100CC (DRAIN) ×2 IMPLANT
GAUZE 4X4 16PLY ~~LOC~~+RFID DBL (SPONGE) ×2 IMPLANT
GLOVE BIO SURGEON STRL SZ 6.5 (GLOVE) ×2 IMPLANT
GLOVE SURG LX 7.5 STRW (GLOVE) ×2
GLOVE SURG LX STRL 7.5 STRW (GLOVE) ×2 IMPLANT
HEMOSTAT SURGICEL 4X8 (HEMOSTASIS) ×1 IMPLANT
HOLDER FOLEY CATH W/STRAP (MISCELLANEOUS) ×2 IMPLANT
IRRIG SUCT STRYKERFLOW 2 WTIP (MISCELLANEOUS) ×2
IRRIGATION SUCT STRKRFLW 2 WTP (MISCELLANEOUS) ×1 IMPLANT
KIT BASIN OR (CUSTOM PROCEDURE TRAY) ×2 IMPLANT
KIT TURNOVER KIT A (KITS) IMPLANT
PROTECTOR NERVE ULNAR (MISCELLANEOUS) ×4 IMPLANT
SEAL CANN UNIV 5-8 DVNC XI (MISCELLANEOUS) ×4 IMPLANT
SEAL XI 5MM-8MM UNIVERSAL (MISCELLANEOUS) ×8
SET TUBE SMOKE EVAC HIGH FLOW (TUBING) ×2 IMPLANT
SOLUTION ELECTROLUBE (MISCELLANEOUS) ×2 IMPLANT
SPIKE FLUID TRANSFER (MISCELLANEOUS) ×2 IMPLANT
SURGIFLO W/THROMBIN 8M KIT (HEMOSTASIS) ×2 IMPLANT
SUT ETHILON 3 0 PS 1 (SUTURE) ×2 IMPLANT
SUT MNCRL AB 4-0 PS2 18 (SUTURE) ×4 IMPLANT
SUT PDS PLUS 0 (SUTURE) ×4
SUT PDS PLUS AB 0 CT-2 (SUTURE) ×2 IMPLANT
SUT V-LOC BARB 180 2/0GR6 GS22 (SUTURE) ×2
SUT VIC AB 0 CT1 27 (SUTURE) ×2
SUT VIC AB 0 CT1 27XBRD ANTBC (SUTURE) ×1 IMPLANT
SUT VICRYL 0 UR6 27IN ABS (SUTURE) ×1 IMPLANT
SUT VLOC BARB 180 ABS3/0GR12 (SUTURE) ×2
SUTURE V-LC BRB 180 2/0GR6GS22 (SUTURE) ×1 IMPLANT
SUTURE VLOC BRB 180 ABS3/0GR12 (SUTURE) ×1 IMPLANT
SYS BAG RETRIEVAL 10MM (BASKET) ×1
SYSTEM BAG RETRIEVAL 10MM (BASKET) ×1 IMPLANT
TOWEL OR 17X26 10 PK STRL BLUE (TOWEL DISPOSABLE) ×2 IMPLANT
TOWEL OR NON WOVEN STRL DISP B (DISPOSABLE) ×2 IMPLANT
TRAY FOLEY MTR SLVR 16FR STAT (SET/KITS/TRAYS/PACK) ×2 IMPLANT
TRAY LAPAROSCOPIC (CUSTOM PROCEDURE TRAY) ×2 IMPLANT
TROCAR XCEL 12X100 BLDLESS (ENDOMECHANICALS) ×2 IMPLANT
WATER STERILE IRR 1000ML POUR (IV SOLUTION) ×2 IMPLANT

## 2021-10-30 NOTE — Anesthesia Postprocedure Evaluation (Signed)
Anesthesia Post Note ? ?Patient: [name] ? ?Procedure(s) Performed: XI ROBOTIC ASSITED  LAPAROSCOPIC PARTIAL NEPHRECTOMY (Right: Abdomen) ? ?  ? ?Patient location during evaluation: PACU ?Anesthesia Type: General ?Level of consciousness: awake ?Pain management: pain level controlled ?Vital Signs Assessment: post-procedure vital signs reviewed and stable ?Respiratory status: spontaneous breathing, nonlabored ventilation, respiratory function stable and patient connected to nasal cannula oxygen ?Cardiovascular status: blood pressure returned to baseline and stable ?Postop Assessment: no apparent nausea or vomiting ?Anesthetic complications: no ? ? ?No notable events documented. ? ?Last Vitals:  ?Vitals:  ? 10/30/21 1142 10/30/21 1611  ?BP: (!) 136/56 140/68  ?Pulse: 83 88  ?Resp:  20  ?Temp: 36.4 ?C 36.8 ?C  ?SpO2: 100% 100%  ?  ?Last Pain:  ?Vitals:  ? 10/30/21 1802  ?TempSrc:   ?PainSc: 0-No pain  ? ? ?  ?  ?  ?  ?  ?  ? ?Myli Pae P Marvie Brevik ? ? ? ? ?

## 2021-10-30 NOTE — Op Note (Signed)
Preoperative diagnosis: Right renal neoplasm ? ?Postoperative diagnosis: Right renal neoplasm ? ?Procedure: ? ?Right robotic-assisted laparoscopic partial nephrectomy ?Intraoperative renal ultrasonography ? ?Surgeon: Roxy Horseman, Brooke Bonito. M.D. ? ?Assistant(s): Debbrah Alar, PA-C ? ?An assistant was required for this surgical procedure.  The duties of the assistant included but were not limited to suctioning, passing suture, camera manipulation, retraction. This procedure would not be able to be performed without an Environmental consultant. ? ?Resident: Dr. Aldine Contes ? ?Anesthesia: General ? ?Complications: None ? ?EBL: 50 mL ? ?IVF:  1600 mL crystalloid ? ?Specimens: ?Right renal neoplasm ? ?Disposition of specimens: Pathology ? ?Intraoperative findings: ?      1. Warm renal ischemia time: 14 minutes ?      2. Intraoperative renal ultrasound findings: There was a small 1.4 cm solid appearing renal mass off the anterior upper pole of the right kidney. ? ?Drains: ?# 15 Blake perinephric drain ? ?Indication: ? ?DEMARCUS THIELKE is a 71 y.o. year old patient with a right renal mass.  After a thorough review of the management options for their renal mass, they elected to proceed with surgical treatment and the above procedure.  We have discussed the potential benefits and risks of the procedure, side effects of the proposed treatment, the likelihood of the patient achieving the goals of the procedure, and any potential problems that might occur during the procedure or recuperation. Informed consent has been obtained. ? ? ?Description of procedure: ? ?The patient was taken to the operating room and a general anesthetic was administered. The patient was given preoperative antibiotics, placed in the right modified flank position with care to pad all potential pressure points, and prepped and draped in the usual sterile fashion. Next a preoperative timeout was performed. ? ?A site was selected on in the midline for placement of the  assistant port. This was placed using a standard open Hassan technique which allowed entry into the peritoneal cavity under direct vision and without difficulty. A 12 mm port was placed and a pneumoperitoneum established. The camera was then used to inspect the abdomen and there was no evidence of any intra-abdominal injuries or other abnormalities. The remaining abdominal ports were then placed. 8 mm robotic ports were placed in the right upper quadrant, right lower quadrant, and far right lateral abdominal wall. An 8 mm port was placed for the camera site just right of the umbilicus. All ports were placed under direct vision without difficulty. The surgical cart was then docked.  ? ?Utilizing the cautery scissors, the white line of Toldt was incised allowing the colon to be mobilized medially and the plane between the mesocolon and the anterior layer of Gerota?s fascia to be developed and the kidney to be exposed.  The ureter and gonadal vein were identified inferiorly and the ureter was lifted anteriorly off the psoas muscle.  Dissection proceeded superiorly along the gonadal vein until the renal vein was identified.  The renal hilum was then carefully isolated with a combination of blunt and sharp dissection allowing the renal arterial and venous structures to be separated and isolated in preparation for renal hilar vessel clamping.   There were four separate renal arteries and one renal vein. ? ?Attention turned to the kidney and the perinephric fat surrounding the renal mass was removed and the kidney was mobilized sufficiently for exposure and resection of the renal mass.  ? ?Intraoperative renal ultrasonography was utilized with the laparoscopic ultrasound probe to identify the renal tumor and identify the tumor  margins.  ? ?Once the renal mass was properly isolated, preparations were made for resection of the tumor.  Reconstructive sutures were placed into the abdomen for the renorrhaphy portion of the  procedure.  All renal arteries were then clamped with bulldog clamps.  The tumor was then excised with cold scissor dissection along with an adequate visible gross margin of normal renal parenchyma. The tumor appeared to be excised without any gross violation of the tumor. The renal collecting system was not entered during removal of the tumor.  A running 3-0 V-lock suture was then brought through the capsule of the kidney and run along the base of the renal defect to provide hemostasis and close any entry into the renal collecting system if present. Weck clips were used to secure this suture outside the renal capsule at the proximal and distal ends. An additional hemostatic agent (Surgiflo) was then placed into the renal defect. A running 2-0 V lock suture was then used to close the capsule of the kidney using a sliding clip technique which resulted in excellent hemostasis.   ? ?The bulldog clamps were then removed from the renal hilar vessel(s).. Total warm renal ischemia time was 14 minutes. The renal tumor resection site was examined. Hemostasis appeared adequate.  ? ?The kidney was placed back into its normal anatomic position and covered with perinephric fat as needed.  A # 3 Blake drain was then brought through the lateral lower port site and positioned in the perinephric space.  It was secured to the skin with a nylon suture. The surgical cart was undocked.  The renal tumor specimen was removed intact within an endopouch retrieval bag via the upper midline port site. This incision site was closed at the fascial layer with 0-vicryl suture. All other laparoscopic/robotic ports were removed under direct vision and the pneumoperitoneum let down with inspection of the operative field performed and hemostasis again confirmed. All incision sites were then injected with local anesthetic and reapproximated at the skin level with 4-0 monocryl subcuticular closures.  Dermabond was applied to the skin.  The patient  tolerated the procedure well and without complications.  The patient was able to be extubated and transferred to the recovery unit in satisfactory condition. ? ?Pryor Curia MD ? ?

## 2021-10-30 NOTE — Telephone Encounter (Signed)
Left message for patient to call back and schedule Medicare Annual Wellness Visit (AWV).  ? ?Please offer to do virtually or by telephone.  Left office number and my jabber [phone]. ? ?Last AWV:06/07/2020 ? ?Please schedule at anytime with Nurse Health Advisor. ?  ?

## 2021-10-30 NOTE — Transfer of Care (Signed)
Immediate Anesthesia Transfer of Care Note ? ?Patient: Phillip Wall ? ?Procedure(s) Performed: XI ROBOTIC ASSITED  LAPAROSCOPIC PARTIAL NEPHRECTOMY (Right: Abdomen) ? ?Patient Location: PACU ? ?Anesthesia Type:General ? ?Level of Consciousness: awake ? ?Airway & Oxygen Therapy: Patient Spontanous Breathing and Patient connected to face mask oxygen ? ?Post-op Assessment: Report given to RN and Post -op Vital signs reviewed and stable ? ?Post vital signs: Reviewed and stable ? ?Last Vitals:  ?Vitals Value Taken Time  ?BP    ?Temp    ?Pulse    ?Resp 14 10/30/21 1033  ?SpO2    ?Vitals shown include unvalidated device data. ? ?Last Pain:  ?Vitals:  ? 10/30/21 0625  ?TempSrc:   ?PainSc: 0-No pain  ?   ? ?  ? ?Complications: No notable events documented. ?

## 2021-10-30 NOTE — Anesthesia Procedure Notes (Signed)
Date/Time: 10/30/2021 10:25 AM ?Performed by: Cynda Familia, CRNA ?Oxygen Delivery Method: Simple face mask ?Placement Confirmation: positive ETCO2 and breath sounds checked- equal and bilateral ?Dental Injury: Teeth and Oropharynx as per pre-operative assessment  ? ? ? ? ?

## 2021-10-30 NOTE — Interval H&P Note (Signed)
History and Physical Interval Note: ? ?10/30/2021 ?6:48 AM ? ?Phillip Wall  has presented today for surgery, with the diagnosis of RIGHT RENAL NEOPLASM.  The various methods of treatment have been discussed with the patient and family. After consideration of risks, benefits and other options for treatment, the patient has consented to  Procedure(s): ?XI ROBOTIC [place] (Right) as a surgical intervention.  The patient's history has been reviewed, patient examined, no change in status, stable for surgery.  I have reviewed the patient's chart and labs.  Questions were answered to the patient's satisfaction.   ? ? ?Les [name] ? ? ?

## 2021-10-30 NOTE — Anesthesia Procedure Notes (Signed)
Procedure Name: Intubation ?Date/Time: 10/30/2021 7:24 AM ?Performed by: Cynda Familia, CRNA ?Pre-anesthesia Checklist: Patient identified, Emergency Drugs available, Suction available and Patient being monitored ?Patient Re-evaluated:Patient Re-evaluated prior to induction ?Oxygen Delivery Method: Circle System Utilized ?Preoxygenation: Pre-oxygenation with 100% oxygen ?Induction Type: IV induction ?Ventilation: Mask ventilation without difficulty ?Laryngoscope Size: Sabra Heck and 2 ?Tube type: Oral ?Number of attempts: 1 ?Airway Equipment and Method: Stylet ?Placement Confirmation: ETT inserted through vocal cords under direct vision, positive ETCO2 and breath sounds checked- equal and bilateral ?Secured at: 22 cm ?Tube secured with: Tape ?Dental Injury: Teeth and Oropharynx as per pre-operative assessment  ?Comments: IV induction [place]-- intubation AM CRNA atraumatic-- teeth and mouth as preop --- bilat BS-- chipping present on front teeth- pt did not report preop ? ? ? ? ?

## 2021-10-30 NOTE — Progress Notes (Signed)
Patient ID: Phillip Wall, male   DOB: 10/09/1950, 71 y.o.   MRN: [id number] ? ?Post-op note ? ?Subjective: ?The patient is doing well.  No complaints. ? ?Objective: ?Vital signs in last 24 hours: ?Temp:  [97.6 ?F (36.4 ?C)-98.7 ?F (37.1 ?C)] 97.6 ?F (36.4 ?C) (05/01 1142) ?Pulse Rate:  [82-94] 83 (05/01 1142) ?Resp:  [12-18] 13 (05/01 1115) ?BP: (131-151)/(56-77) 136/56 (05/01 1142) ?SpO2:  [99 %-100 %] 100 % (05/01 1142) ?Weight:  [81.2 kg] 81.2 kg (05/01 0626) ? ?Intake/Output from previous day: ?No intake/output data recorded. ?Intake/Output this shift: ?Total I/O ?In: 1900 [I.V.:1900] ?Out: 520 [Urine:335; Drains:125; Blood:60] ? ?Physical Exam:  ?General: Alert and oriented. ?Abdomen: Soft, Nondistended. ?Incisions: Clean and dry. ? ?Lab Results: ?Recent Labs  ?  10/30/21 ?1111  ?HGB 12.4*  ?HCT 35.5*  ? ? ?Assessment/Plan: ?POD#0  ? ?1) Continue to monitor, begin ambulation later tonight ? ? ?Pryor Curia MD ? ? LOS: 0 days  ? ?Phillip Wall ?10/30/2021, 3:24 PM ? ? ?   ?

## 2021-10-30 NOTE — Discharge Instructions (Signed)
Activity:  You are encouraged to ambulate frequently (about every hour during waking hours) to help prevent blood clots from forming in your legs or lungs.  However, you should not engage in any heavy lifting (> 10-15 lbs), strenuous activity, or straining. ?Diet: You should advance your diet as instructed by your physician.  It will be normal to have some bloating, nausea, and abdominal discomfort intermittently. ?Prescriptions:  You will be provided a prescription for pain medication to take as needed.  If your pain is not severe enough to require the prescription pain medication, you may take extra strength Tylenol instead which will have less side effects.  You should also take a prescribed stool softener to avoid straining with bowel movements as the prescription pain medication may constipate you. ?Incisions: You may remove your dressing bandages 48 hours after surgery if not removed in the hospital.  You will either have some small staples or special tissue glue at each of the incision sites. Once the bandages are removed (if present), the incisions may stay open to air.  You may start showering (but not soaking or bathing in water) the 2nd day after surgery and the incisions simply need to be patted dry after the shower.  No additional care is needed. ?What to call us about: You should call the office [phone]) if you develop fever > 101 or develop persistent vomiting.  ? ?You may resume xarelto 1 week after surgery.  You may resume aspirin, advil, aleve, vitamins, and supplements 7 days after surgery. ?

## 2021-10-31 ENCOUNTER — Encounter (HOSPITAL_COMMUNITY): Payer: Self-pay | Admitting: Urology

## 2021-10-31 DIAGNOSIS — D3001 Benign neoplasm of right kidney: Secondary | ICD-10-CM | POA: Diagnosis not present

## 2021-10-31 DIAGNOSIS — Z8616 Personal history of COVID-19: Secondary | ICD-10-CM | POA: Diagnosis not present

## 2021-10-31 DIAGNOSIS — I10 Essential (primary) hypertension: Secondary | ICD-10-CM | POA: Diagnosis not present

## 2021-10-31 DIAGNOSIS — Z86711 Personal history of pulmonary embolism: Secondary | ICD-10-CM | POA: Diagnosis not present

## 2021-10-31 DIAGNOSIS — Z7901 Long term (current) use of anticoagulants: Secondary | ICD-10-CM | POA: Diagnosis not present

## 2021-10-31 LAB — BASIC METABOLIC PANEL
Anion gap: 6 (ref 5–15)
BUN: 15 mg/dL (ref 8–23)
CO2: 21 mmol/L — ABNORMAL LOW (ref 22–32)
Calcium: 8.4 mg/dL — ABNORMAL LOW (ref 8.9–10.3)
Chloride: 109 mmol/L (ref 98–111)
Creatinine, Ser: 1.42 mg/dL — ABNORMAL HIGH (ref 0.61–1.24)
GFR, Estimated: 53 mL/min — ABNORMAL LOW (ref >60)
Glucose, Bld: 137 mg/dL — ABNORMAL HIGH (ref 70–99)
Potassium: 4.3 mmol/L (ref 3.5–5.1)
Sodium: 136 mmol/L (ref 135–145)

## 2021-10-31 LAB — HEMOGLOBIN AND HEMATOCRIT, BLOOD
HCT: 34.9 % — ABNORMAL LOW (ref 39.0–52.0)
Hemoglobin: 11.8 g/dL — ABNORMAL LOW (ref 13.0–17.0)

## 2021-10-31 LAB — SURGICAL PATHOLOGY

## 2021-10-31 LAB — CREATININE, FLUID (PLEURAL, PERITONEAL, JP DRAINAGE): Creat, Fluid: 1.4 mg/dL

## 2021-10-31 MED ORDER — CHLORHEXIDINE GLUCONATE CLOTH 2 % EX PADS: 6.0000 | MEDICATED_PAD | Freq: Every day | CUTANEOUS | Status: DC

## 2021-10-31 MED ORDER — TRAMADOL HCL 50 MG PO TABS: 50.0000 mg | ORAL_TABLET | Freq: Four times a day (QID) | ORAL | Status: DC | PRN

## 2021-10-31 MED ORDER — BISACODYL 10 MG RE SUPP
10.0000 mg | Freq: Once | RECTAL | Status: AC
  Administered 2021-10-31: 10 mg via RECTAL
  Filled 2021-10-31: qty 1

## 2021-10-31 NOTE — Progress Notes (Signed)
?  Transition of Care (TOC) Screening Note ? ? ?Patient Details  ?Name: Phillip Wall ?Date of Birth: Nov 11, 1950 ? ? ?Transition of Care [facility]) CM/SW Contact:    ?Dessa Phi, RN ?Phone Number: ?10/31/2021, 11:10 AM ? ? ? ?Transition of Care Department [facility]) has reviewed patient and no TOC needs have been identified at this time. We will continue to monitor patient advancement through interdisciplinary progression rounds. If new patient transition needs arise, please place a TOC consult. ? ? ?

## 2021-10-31 NOTE — Plan of Care (Signed)
?  Problem: Education: ?Goal: Knowledge of General Education information will improve ?Description: Including pain rating scale, medication(s)/side effects and non-pharmacologic comfort measures ?Outcome: Adequate for Discharge ?  ?Problem: Health Behavior/Discharge Planning: ?Goal: Ability to manage health-related needs will improve ?Outcome: Adequate for Discharge ?  ?Problem: Clinical Measurements: ?Goal: Ability to maintain clinical measurements within normal limits will improve ?Outcome: Adequate for Discharge ?Goal: Will remain free from infection ?Outcome: Adequate for Discharge ?Goal: Diagnostic test results will improve ?Outcome: Adequate for Discharge ?Goal: Respiratory complications will improve ?Outcome: Adequate for Discharge ?Goal: Cardiovascular complication will be avoided ?Outcome: Adequate for Discharge ?  ?Problem: Activity: ?Goal: Risk for activity intolerance will decrease ?Outcome: Adequate for Discharge ?  ?Problem: Nutrition: ?Goal: Adequate nutrition will be maintained ?Outcome: Adequate for Discharge ?  ?Problem: Coping: ?Goal: Level of anxiety will decrease ?Outcome: Adequate for Discharge ?  ?Problem: Elimination: ?Goal: Will not experience complications related to bowel motility ?Outcome: Adequate for Discharge ?Goal: Will not experience complications related to urinary retention ?Outcome: Adequate for Discharge ?  ?Problem: Pain Managment: ?Goal: General experience of comfort will improve ?Outcome: Adequate for Discharge ?  ?Problem: Safety: ?Goal: Ability to remain free from injury will improve ?Outcome: Adequate for Discharge ?  ?Problem: Skin Integrity: ?Goal: Risk for impaired skin integrity will decrease ?Outcome: Adequate for Discharge ?  ?Problem: Education: ?Goal: Knowledge of the prescribed therapeutic regimen will improve ?Outcome: Adequate for Discharge ?  ?Problem: Bowel/Gastric: ?Goal: Gastrointestinal status for postoperative course will improve ?Outcome: Adequate for  Discharge ?  ?Problem: Clinical Measurements: ?Goal: Postoperative complications will be avoided or minimized ?Outcome: Adequate for Discharge ?  ?Problem: Respiratory: ?Goal: Ability to achieve and maintain a regular respiratory rate will improve ?Outcome: Adequate for Discharge ?  ?Problem: Skin Integrity: ?Goal: Demonstration of wound healing without infection will improve ?Outcome: Adequate for Discharge ?  ?Problem: Urinary Elimination: ?Goal: Ability to avoid or minimize complications of infection will improve ?Outcome: Adequate for Discharge ?Goal: Ability to achieve and maintain urine output will improve ?Outcome: Adequate for Discharge ?  ?

## 2021-10-31 NOTE — Discharge Summary (Signed)
Date of admission: 10/30/2021 ? ?Date of discharge: 10/31/2021 ? ?Admission diagnosis: Right renal mass  ? ?Discharge diagnosis: Right renal mass  ? ?Secondary diagnoses:  ?Patient Active Problem List  ? Diagnosis Date Noted  ? 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 Pulmonary embolism ([place]) 02/20/2021  ? Pulmonary embolus ([place]) 02/19/2021  ? Renal insufficiency 02/19/2021  ? Normocytic anemia 02/19/2021  ? History of COVID-19 02/19/2021  ? Anxiety 02/19/2021  ? Need for pneumococcal vaccination 12/10/2019  ? Erectile dysfunction due to arterial insufficiency 03/12/2019  ? Gastroesophageal reflux disease 03/12/2019  ? Need for influenza vaccination 03/12/2019  ? Inclusion cyst 09/01/2018  ? Healthcare maintenance 09/01/2018  ? Mood disorder ([place]) 12/16/2017  ? Essential hypertension 12/16/2017  ? ? ?Procedures performed: ?Procedure(s): ?XI ROBOTIC ASSITED  LAPAROSCOPIC PARTIAL NEPHRECTOMY ? ?History and Physical: For full details, please see admission history and physical. Briefly, Phillip Wall is a 71 y.o. year old patient with history of small right renal mass who presents for robotic right partial nephrectomy.  ? ?Hospital Course: Patient tolerated the procedure well.  He was then transferred to the floor after an uneventful PACU stay.  His hospital course was uncomplicated.  He began ambulating on POD0 without issues. His Hgb remained stable post operatively. His creatinine remained at his baseline of ~1.4. On POD1 his catheter was removed and he passed a trial of void. JP creatinine was checked and consistent with serum so this was removed prior to discharge. His diet was slowly advanced and by POD1 he was on a regular diet without nausea and vomiting. He had a bowel movement prior to discharge. On POD#1 he had met discharge criteria: was eating a regular diet, was up and ambulating independently,  pain was well controlled, was voiding without a catheter, and  was ready to for discharge. ? ?He was instructed to hold his Alen Blew for 1 week post operatively. Instructed to resume on 5/8. He has follow up arranged for 5/23 for a post operatively check  ? ? ?Laboratory values:  ?Recent Labs  ?  10/30/21 ?1111 10/31/21 ?0456  ?HGB 12.4* 11.8*  ?HCT 35.5* 34.9*  ? ?Recent Labs  ?  10/30/21 ?1111 10/31/21 ?0456  ?NA 136 136  ?K 3.5 4.3  ?CL 106 109  ?CO2 20* 21*  ?GLUCOSE 146* 137*  ?BUN 22 15  ?CREATININE 1.32* 1.42*  ?CALCIUM 8.7* 8.4*  ? ?No results for input(s): LABPT, INR in the last 72 hours. ?No results for input(s): LABURIN in the last 72 hours. ?Results for orders placed or performed during the hospital encounter of 02/19/21  ?SARS CORONAVIRUS 2 (TAT 6-24 HRS) Nasopharyngeal Nasopharyngeal Swab     Status: None  ? Collection Time: 02/19/21  3:10 PM  ? Specimen: Nasopharyngeal Swab  ?Result Value Ref Range Status  ? SARS Coronavirus 2 NEGATIVE NEGATIVE Final  ?  Comment: (NOTE) ?SARS-CoV-2 target nucleic acids are NOT DETECTED. ? ?The SARS-CoV-2 RNA is generally detectable in upper and lower ?respiratory specimens during the acute phase of infection. Negative ?results do not preclude SARS-CoV-2 infection, do not rule out ?co-infections with other pathogens, and should not be used as the ?sole basis for treatment or other patient management decisions. ?Negative results must be combined with clinical observations, ?patient history, and epidemiological information. The expected ?result is Negative. ? ?Fact Sheet for Patients: ?SugarRoll.be ? ?Fact Sheet for Healthcare Providers: ?https://www.woods-mathews.com/ ? ?This test is not yet approved or cleared by  the [employer] and  ?has been authorized for detection and/or diagnosis of SARS-CoV-2 by ?FDA under an Emergency Use Authorization (EUA). This EUA will remain  ?in effect (meaning this test can be used) for the duration of the ?COVID-19 declaration under Se ction 564(b)(1) of  the Act, 21 U.S.C. ?section 360bbb-3(b)(1), unless the authorization is terminated or ?revoked sooner. ? ?Performed at [facility], [place] [address] [place], [place] ?[address] ?  ?Surgical pcr screen     Status: None  ? Collection Time: 02/19/21  4:03 PM  ? Specimen: Nasal Mucosa; Nasal Swab  ?Result Value Ref Range Status  ? MRSA, PCR NEGATIVE NEGATIVE Final  ? Staphylococcus aureus NEGATIVE NEGATIVE Final  ?  Comment: (NOTE) ?The Xpert SA Assay (FDA approved for NASAL specimens in patients 57 ?years of age and older), is one component of a comprehensive ?surveillance program. It is not intended to diagnose infection nor to ?guide or monitor treatment. ?Performed at [facility], [place] [address], [place], [place] ?[address] ?  ? ? ?Disposition: Home ? ?Discharge instruction: The patient was instructed to be ambulatory but told to refrain from heavy lifting, strenuous activity, or driving.  ? ?Discharge medications:  ?Allergies as of 10/31/2021   ?No Known Allergies ?  ? ?  ?Medication List  ?  ? ?STOP taking these medications   ? ?multivitamin tablet ?  ?Vitamin D3 25 MCG (1000 UT) Caps ?  ?Xarelto 20 MG Tabs tablet ?Generic drug: rivaroxaban ?  ?ZINC PO ?  ? ?  ? ?TAKE these medications   ? ?amLODipine 10 MG tablet ?Commonly known as: NORVASC ?TAKE 1 TABLET(10 MG) BY MOUTH DAILY ?  ?ARTIFICIAL TEAR OP ?Place 1 drop into both eyes daily as needed (dry eyes). ?  ?docusate sodium 100 MG capsule ?Commonly known as: COLACE ?Take 1 capsule (100 mg total) by mouth 2 (two) times daily. ?  ?famotidine 40 MG tablet ?Commonly known as: Pepcid ?Take one daily as needed for indigestion ?What changed:  ?how much to take ?how to take this ?when to take this ?reasons to take this ?additional instructions ?  ?traMADol 50 MG tablet ?Commonly known as: Ultram ?Take 1-2 tablets (50-100 mg total) by mouth every 6 (six) hours as needed for moderate pain or severe pain. ?  ? ?  ? ? ?Followup:  ? Follow-up Information   ? ?  Phillip Bring, MD Follow up on 11/21/2021.   ?Specialty: Urology ?Why: at 9:00 ?Contact information: ?[place] ?[place] [address] ?[phone] ? ? ?  ?  ? ?  ?  ? ?  ? ? ? ?

## 2021-10-31 NOTE — Progress Notes (Signed)
Discharge instructions provided to and reviewed with patient.  Patient verbalized understanding.  PIVs removed.  Patient escorted to main entrance via wheelchair, with belongings, for transport home with wife and daughters. ? ?Angie Fava, RN  ?

## 2021-10-31 NOTE — Progress Notes (Signed)
Patient reports having a "small," "runny" bowel movement, with "a lot of gas," and a "moderate" amount of urine into the toilet (unable to measure). ? ?Angie Fava, RN  ?

## 2021-10-31 NOTE — Plan of Care (Signed)

## 2021-10-31 NOTE — Progress Notes (Signed)
Patient ID: Phillip Wall, male   DOB: 25-Apr-1951, 71 y.o.   MRN: [id number] ? ?1 Day Post-Op ?Subjective: ?He is doing well with no complaints this morning.  Ambulated well last night.  No flatus yet. ? ?Objective: ?Vital signs in last 24 hours: ?Temp:  [97.6 ?F (36.4 ?C)-98.7 ?F (37.1 ?C)] 98.2 ?F (36.8 ?C) (05/02 0449) ?Pulse Rate:  [64-88] 68 (05/02 0449) ?Resp:  [12-20] 20 (05/02 0449) ?BP: (130-150)/(56-68) 136/60 (05/02 0449) ?SpO2:  [99 %-100 %] 100 % (05/02 0449) ? ?Intake/Output from previous day: ?05/01 0701 - 05/02 0700 ?In: 4309.5 [P.O.:240; I.V.:3686.6; IV Piggyback:382.9] ?Out: 6962 [Urine:4235; Drains:225; Blood:60] ?Intake/Output this shift: ?No intake/output data recorded. ? ?Physical Exam:  ?General: Alert and oriented ?Abdomen: Soft, ND, normal bowel sounds ?Incisions: C/D/I ?Ext: NT, No erythema ? ?Lab Results: ?Recent Labs  ?  10/30/21 ?1111 10/31/21 ?0456  ?HGB 12.4* 11.8*  ?HCT 35.5* 34.9*  ? ?BMET ?Recent Labs  ?  10/30/21 ?1111 10/31/21 ?0456  ?NA 136 136  ?K 3.5 4.3  ?CL 106 109  ?CO2 20* 21*  ?GLUCOSE 146* 137*  ?BUN 22 15  ?CREATININE 1.32* 1.42*  ?CALCIUM 8.7* 8.4*  ? ? ? ?Studies/Results: ?No results found. ? ?Assessment/Plan: ?POD # 1 s/p right RAL partial nephrectomy ?- Ambulate, IS ?- Check drain Cr ?- Advance diet ?- Oral pain medication ?- D/C Foley ?- Follow renal function ?- Reassess later today for probable discharge this afternoon ? ? LOS: 0 days  ? ?Les [name] ?10/31/2021, 7:03 AM ? ?  ?

## 2021-10-31 NOTE — Progress Notes (Signed)
Patient's Foley catheter removed per MD orders.  Patient due to void by 1330.  IV fluids stopped, patient saline locked.  Patient instructed on MD orders to ambulate in hallway every 6 hours.  Diet advanced to regular per MD order and patient request.  Patient instructed to eat a small breakfast and report any discomfort, abdominal pain.  Patient verbalized understanding.  Patient denies any pain and N/V at the current moment. ? ?Angie Fava, RN  ?

## 2021-10-31 NOTE — Progress Notes (Signed)
JP drain removed, patient tolerated well.  Patient educated on wound care.  Patient verbalized understanding. ? ?Angie Fava, RN  ?

## 2021-10-31 NOTE — Progress Notes (Signed)
?  Transition of Care (TOC) Screening Note ? ? ?Patient Details  ?Name: Phillip Wall ?Date of Birth: 11/20/1950 ? ? ?Transition of Care [facility]) CM/SW Contact:    ?Dessa Phi, RN ?Phone Number: ?10/31/2021, 11:09 AM ? ? ? ?Transition of Care Department [facility]) has reviewed patient and no TOC needs have been identified at this time. We will continue to monitor patient advancement through interdisciplinary progression rounds. If new patient transition needs arise, please place a TOC consult. ?  ?

## 2021-11-01 ENCOUNTER — Encounter: Payer: Self-pay | Admitting: Family Medicine

## 2021-11-15 ENCOUNTER — Ambulatory Visit (INDEPENDENT_AMBULATORY_CARE_PROVIDER_SITE_OTHER): Payer: Medicare Other

## 2021-11-15 DIAGNOSIS — Z0000 Encounter for general adult medical examination without abnormal findings: Secondary | ICD-10-CM | POA: Diagnosis not present

## 2021-11-15 NOTE — Progress Notes (Signed)
Subjective:   Phillip Wall is a 71 y.o. male who presents for an Initial Medicare Annual Wellness Visit.   I connected with [name]  today by telephone and verified that I am speaking with the correct person using two identifiers. Location patient: home Location provider: work Persons participating in the virtual visit: patient, provider.   I discussed the limitations, risks, security and privacy concerns of performing an evaluation and management service by telephone and the availability of in person appointments. I also discussed with the patient that there may be a patient responsible charge related to this service. The patient expressed understanding and verbally consented to this telephonic visit.    Interactive audio and video telecommunications were attempted between this provider and patient, however failed, due to patient having technical difficulties OR patient did not have access to video capability.  We continued and completed visit with audio only.    Review of Systems     Cardiac Risk Factors include: advanced age (>27mn, >>21women);hypertension;male gender     Objective:    Today's Vitals   There is no height or weight on file to calculate BMI.     11/15/2021    3:39 PM 10/19/2021   12:53 PM 09/13/2021    1:07 PM 05/23/2021    1:54 PM 02/19/2021    3:53 PM 02/19/2021    9:16 AM 02/15/2021    2:25 PM  Advanced Directives  Does Patient Have a Medical Advance Directive? No No No No  No No  Would patient like information on creating a medical advance directive? No - Patient declined  No - Patient declined No - Patient declined Yes (Inpatient - patient requests chaplain consult to create a medical advance directive) No - Patient declined     Current Medications (verified) Outpatient Encounter Medications as of 11/15/2021  Medication Sig   amLODipine (NORVASC) 10 MG tablet TAKE 1 TABLET(10 MG) BY MOUTH DAILY   ARTIFICIAL TEAR OP Place 1 drop into both eyes  daily as needed (dry eyes).   docusate sodium (COLACE) 100 MG capsule Take 1 capsule (100 mg total) by mouth 2 (two) times daily.   famotidine (PEPCID) 40 MG tablet Take one daily as needed for indigestion (Patient taking differently: Take 40 mg by mouth daily as needed for indigestion.)   traMADol (ULTRAM) 50 MG tablet Take 1-2 tablets (50-100 mg total) by mouth every 6 (six) hours as needed for moderate pain or severe pain.   No facility-administered encounter medications on file as of 11/15/2021.    Allergies (verified) Patient has no known allergies.   History: Past Medical History:  Diagnosis Date   Anxiety    Depression    ED (erectile dysfunction)    GERD (gastroesophageal reflux disease)    History of colonic polyps    Hypertension    Past Surgical History:  Procedure Laterality Date   ROBOTIC ASSITED PARTIAL NEPHRECTOMY Right 10/30/2021   Procedure: XI ROBOTIC ASSITED  LAPAROSCOPIC PARTIAL NEPHRECTOMY;  Surgeon: BRaynelle Bring MD;  Location: [facility];  Service: Urology;  Laterality: Right;   TONSILECTOMY/ADENOIDECTOMY WITH MYRINGOTOMY     VASECTOMY  1986   Family History  Problem Relation Age of Onset   Bladder Cancer Mother    Heart disease Father    Heart attack Father    Breast cancer Paternal Grandmother    Breast cancer Daughter    Alcohol abuse Sister    Hypertension Sister    Cancer Sister    Drug  abuse Sister    Social History   Socioeconomic History   Marital status: Married    Spouse name: Not on file   Number of children: 3   Years of education: Not on file   Highest education level: Not on file  Occupational History   Occupation: retired  Tobacco Use   Smoking status: Never   Smokeless tobacco: Never  Vaping Use   Vaping Use: Never used  Substance and Sexual Activity   Alcohol use: Not Currently   Drug use: Never   Sexual activity: Yes  Other Topics Concern   Not on file  Social History Narrative   Not on file   Social Determinants of  Health   Financial Resource Strain: Low Risk    Difficulty of Paying Living Expenses: Not hard at all  Food Insecurity: No Food Insecurity   Worried About Charity fundraiser in the Last Year: Never true   [place] in the Last Year: Never true  Transportation Needs: No Transportation Needs   Lack of Transportation (Medical): No   Lack of Transportation (Non-Medical): No  Physical Activity: Inactive   Days of Exercise per Week: 0 days   Minutes of Exercise per Session: 0 min  Stress: No Stress Concern Present   Feeling of Stress : Not at all  Social Connections: Moderately Integrated   Frequency of Communication with Friends and Family: Three times a week   Frequency of Social Gatherings with Friends and Family: Three times a week   Attends Religious Services: More than 4 times per year   Active Member of Clubs or Organizations: No   Attends Archivist Meetings: Never   Marital Status: Married    Tobacco Counseling Counseling given: Not Answered   Clinical Intake:  Pre-visit preparation completed: Yes  Pain : No/denies pain     Nutritional Risks: None Diabetes: No  How often do you need to have someone help you when you read instructions, pamphlets, or other written materials from your doctor or pharmacy?: 1 - Never What is the last grade level you completed in school?: High school  Diabetic?no   Interpreter Needed?: No  Information entered by :: Dasher   Activities of Daily Living    11/15/2021    3:39 PM 10/19/2021   12:54 PM  In your present state of health, do you have any difficulty performing the following activities:  Hearing? 0   Vision? 0   Difficulty concentrating or making decisions? 0   Walking or climbing stairs? 0   Dressing or bathing? 0   Doing errands, shopping? 0 0  Preparing Food and eating ? N   Using the Toilet? N   In the past six months, have you accidently leaked urine? N   Do you have problems with loss of  bowel control? N   Managing your Medications? N   Managing your Finances? N   Housekeeping or managing your Housekeeping? N     Patient Care Team: Libby Maw, MD as PCP - General (Family Medicine)  Indicate any recent Medical Services you may have received from other than [facility] providers in the past year (date may be approximate).     Assessment:   This is a routine wellness examination for [facility].  Hearing/Vision screen Vision Screening - Comments:: Annual eye exams wears glasses   Dietary issues and exercise activities discussed: Current Exercise Habits: The patient does not participate in regular exercise at present, Exercise limited by:  Other - see comments (surgery)   Goals Addressed   None    Depression Screen    11/15/2021    3:39 PM 11/15/2021    3:37 PM 07/10/2021    9:27 AM 06/08/2021    2:16 PM 05/10/2021   11:36 AM 02/23/2021   11:24 AM 01/27/2021    9:44 AM  PHQ 2/9 Scores  PHQ - 2 Score 0 0 0 0 0 0 0  PHQ- 9 Score       2    Fall Risk    11/15/2021    3:39 PM 07/10/2021    9:28 AM 06/08/2021    2:17 PM 05/10/2021   11:36 AM 02/23/2021   11:24 AM  [place] in the past year? 0 0 0 0 0  Number falls in past yr: 0  0 0   Injury with Fall? 0      Follow up Falls evaluation completed        [place]:  Any stairs in or around the home? Yes  If so, are there any without handrails? No  Home free of loose throw rugs in walkways, pet beds, electrical cords, etc? Yes  Adequate lighting in your home to reduce risk of falls? Yes   ASSISTIVE DEVICES UTILIZED TO PREVENT FALLS:  Life alert? No  Use of a cane, walker or w/c? No  Grab bars in the bathroom? Yes  Shower chair or bench in shower? Yes  Elevated toilet seat or a handicapped toilet? Yes   Cognitive Function:    Normal cognitive status assessed by telephone conversation by this Nurse Health Advisor. No abnormalities found.       Immunizations Immunization History  Administered Date(s) Administered   Fluad Quad(high Dose 65+) 03/12/2019, 06/07/2020   Influenza, High Dose Seasonal PF 07/10/2021   Influenza,inj,Quad PF,6+ Mos 05/02/2018   PFIZER(Purple Top)SARS-COV-2 Vaccination 09/21/2019, 10/15/2019, 04/11/2020   Pneumococcal Polysaccharide-23 12/10/2019   Td 07/29/2009   Tdap 07/03/2011    TDAP status: Due, Education has been provided regarding the importance of this vaccine. Advised may receive this vaccine at local pharmacy or Health Dept. Aware to provide a copy of the vaccination record if obtained from local pharmacy or Health Dept. Verbalized acceptance and understanding.  Flu Vaccine status: Up to date  Pneumococcal vaccine status: Up to date  Covid-19 vaccine status: Completed vaccines  Qualifies for Shingles Vaccine? Yes   Zostavax completed No   Shingrix Completed?: No.    Education has been provided regarding the importance of this vaccine. Patient has been advised to call insurance company to determine out of pocket expense if they have not yet received this vaccine. Advised may also receive vaccine at local pharmacy or Health Dept. Verbalized acceptance and understanding.  Screening Tests Health Maintenance  Topic Date Due   Zoster Vaccines- Shingrix (1 of 2) Never done   Pneumonia Vaccine 22+ Years old (2 - PCV) 12/09/2020   TETANUS/TDAP  07/02/2021   INFLUENZA VACCINE  01/30/2022   OPHTHALMOLOGY EXAM  02/06/2022   COLONOSCOPY (Pts 45-32yr Insurance coverage will need to be confirmed)  11/15/2025   Hepatitis C Screening  Completed   HPV VACCINES  Aged Out   COVID-19 Vaccine  Discontinued    Health Maintenance  Health Maintenance Due  Topic Date Due   Zoster Vaccines- Shingrix (1 of 2) Never done   Pneumonia Vaccine 72 Years old (2 - PCV) 12/09/2020   TETANUS/TDAP  07/02/2021  Colorectal cancer screening: Type of screening: Colonoscopy. Completed 11/16/2015. Repeat every 10  years  Lung Cancer Screening: (Low Dose CT Chest recommended if Age 47-80 years, 30 pack-year currently smoking OR have quit w/in 15years.) does not qualify.   Lung Cancer Screening Referral: n/a  Additional Screening:  Hepatitis C Screening: does not qualify; Completed 10/30/2016  Vision Screening: Recommended annual ophthalmology exams for early detection of glaucoma and other disorders of the eye. Is the patient up to date with their annual eye exam?  Yes  Who is the provider or what is the name of the office in which the patient attends annual eye exams? Dr.Hecker  If pt is not established with a provider, would they like to be referred to a provider to establish care? No .   Dental Screening: Recommended annual dental exams for proper oral hygiene  Community Resource Referral / Chronic Care Management: CRR required this visit?  No   CCM required this visit?  No      Plan:     I have personally reviewed and noted the following in the patient's chart:   Medical and social history Use of alcohol, tobacco or illicit drugs  Current medications and supplements including opioid prescriptions. Patient is not currently taking opioid prescriptions. Functional ability and status Nutritional status Physical activity Advanced directives List of other physicians Hospitalizations, surgeries, and ER visits in previous 12 months Vitals Screenings to include cognitive, depression, and falls Referrals and appointments  In addition, I have reviewed and discussed with patient certain preventive protocols, quality metrics, and best practice recommendations. A written personalized care plan for preventive services as well as general preventive health recommendations were provided to patient.     Randel Pigg, LPN   03/02/5175   Nurse Notes: none

## 2021-11-15 NOTE — Patient Instructions (Signed)
Mr. Hart , ?Thank you for taking time to come for your Medicare Wellness Visit. I appreciate your ongoing commitment to your health goals. Please review the following plan we discussed and let me know if I can assist you in the future.  ? ?Screening recommendations/referrals: ?Colonoscopy: 11/16/2015 ?Recommended yearly ophthalmology/optometry visit for glaucoma screening and checkup ?Recommended yearly dental visit for hygiene and checkup ? ?Vaccinations: ?Influenza vaccine: completed  ?Pneumococcal vaccine: completed  ?Tdap vaccine: due  ?Shingles vaccine: will consider    ? ?Advanced directives: none  ? ?Conditions/risks identified: none  ? ?Next appointment: none  ? ?Preventive Care 13 Years and Older, Male ?Preventive care refers to lifestyle choices and visits with your health care provider that can promote health and wellness. ?What does preventive care include? ?A yearly physical exam. This is also called an annual well check. ?Dental exams once or twice a year. ?Routine eye exams. Ask your health care provider how often you should have your eyes checked. ?Personal lifestyle choices, including: ?Daily care of your teeth and gums. ?Regular physical activity. ?Eating a healthy diet. ?Avoiding tobacco and drug use. ?Limiting alcohol use. ?Practicing safe sex. ?Taking low doses of aspirin every day. ?Taking vitamin and mineral supplements as recommended by your health care provider. ?What happens during an annual well check? ?The services and screenings done by your health care provider during your annual well check will depend on your age, overall health, lifestyle risk factors, and family history of disease. ?Counseling  ?Your health care provider may ask you questions about your: ?Alcohol use. ?Tobacco use. ?Drug use. ?Emotional well-being. ?Home and relationship well-being. ?Sexual activity. ?Eating habits. ?History of falls. ?Memory and ability to understand (cognition). ?Work and work  Statistician. ?Screening  ?You may have the following tests or measurements: ?Height, weight, and BMI. ?Blood pressure. ?Lipid and cholesterol levels. These may be checked every 5 years, or more frequently if you are over 79 years old. ?Skin check. ?Lung cancer screening. You may have this screening every year starting at age 43 if you have a 30-pack-year history of smoking and currently smoke or have quit within the past 15 years. ?Fecal occult blood test (FOBT) of the stool. You may have this test every year starting at age 49. ?Flexible sigmoidoscopy or colonoscopy. You may have a sigmoidoscopy every 5 years or a colonoscopy every 10 years starting at age 62. ?Prostate cancer screening. Recommendations will vary depending on your family history and other risks. ?Hepatitis C blood test. ?Hepatitis B blood test. ?Sexually transmitted disease (STD) testing. ?Diabetes screening. This is done by checking your blood sugar (glucose) after you have not eaten for a while (fasting). You may have this done every 1-3 years. ?Abdominal aortic aneurysm (AAA) screening. You may need this if you are a current or former smoker. ?Osteoporosis. You may be screened starting at age 69 if you are at high risk. ?Talk with your health care provider about your test results, treatment options, and if necessary, the need for more tests. ?Vaccines  ?Your health care provider may recommend certain vaccines, such as: ?Influenza vaccine. This is recommended every year. ?Tetanus, diphtheria, and acellular pertussis (Tdap, Td) vaccine. You may need a Td booster every 10 years. ?Zoster vaccine. You may need this after age 66. ?Pneumococcal 13-valent conjugate (PCV13) vaccine. One dose is recommended after age 57. ?Pneumococcal polysaccharide (PPSV23) vaccine. One dose is recommended after age 68. ?Talk to your health care provider about which screenings and vaccines you need and how often  you need them. ?This information is not intended to replace  advice given to you by your health care provider. Make sure you discuss any questions you have with your health care provider. ?Document Released: 07/15/2015 Document Revised: 03/07/2016 Document Reviewed: 04/19/2015 ?Elsevier Interactive Patient Education ? 2017 [place]. ? ?Fall Prevention in the Home ?Falls can cause injuries. They can happen to people of all ages. There are many things you can do to make your home safe and to help prevent falls. ?What can I do on the outside of my home? ?Regularly fix the edges of walkways and driveways and fix any cracks. ?Remove anything that might make you trip as you walk through a door, such as a raised step or threshold. ?Trim any bushes or trees on the path to your home. ?Use bright outdoor lighting. ?Clear any walking paths of anything that might make someone trip, such as rocks or tools. ?Regularly check to see if handrails are loose or broken. Make sure that both sides of any steps have handrails. ?Any raised decks and porches should have guardrails on the edges. ?Have any leaves, snow, or ice cleared regularly. ?Use sand or salt on walking paths during winter. ?Clean up any spills in your garage right away. This includes oil or grease spills. ?What can I do in the bathroom? ?Use night lights. ?Install grab bars by the toilet and in the tub and shower. Do not use towel bars as grab bars. ?Use non-skid mats or decals in the tub or shower. ?If you need to sit down in the shower, use a plastic, non-slip stool. ?Keep the floor dry. Clean up any water that spills on the floor as soon as it happens. ?Remove soap buildup in the tub or shower regularly. ?Attach bath mats securely with double-sided non-slip rug tape. ?Do not have throw rugs and other things on the floor that can make you trip. ?What can I do in the bedroom? ?Use night lights. ?Make sure that you have a light by your bed that is easy to reach. ?Do not use any sheets or blankets that are too big for your bed.  They should not hang down onto the floor. ?Have a firm chair that has side arms. You can use this for support while you get dressed. ?Do not have throw rugs and other things on the floor that can make you trip. ?What can I do in the kitchen? ?Clean up any spills right away. ?Avoid walking on wet floors. ?Keep items that you use a lot in easy-to-reach places. ?If you need to reach something above you, use a strong step stool that has a grab bar. ?Keep electrical cords out of the way. ?Do not use floor polish or wax that makes floors slippery. If you must use wax, use non-skid floor wax. ?Do not have throw rugs and other things on the floor that can make you trip. ?What can I do with my stairs? ?Do not leave any items on the stairs. ?Make sure that there are handrails on both sides of the stairs and use them. Fix handrails that are broken or loose. Make sure that handrails are as long as the stairways. ?Check any carpeting to make sure that it is firmly attached to the stairs. Fix any carpet that is loose or worn. ?Avoid having throw rugs at the top or bottom of the stairs. If you do have throw rugs, attach them to the floor with carpet tape. ?Make sure that you have a light  switch at the top of the stairs and the bottom of the stairs. If you do not have them, ask someone to add them for you. ?What else can I do to help prevent falls? ?Wear shoes that: ?Do not have high heels. ?Have rubber bottoms. ?Are comfortable and fit you well. ?Are closed at the toe. Do not wear sandals. ?If you use a stepladder: ?Make sure that it is fully opened. Do not climb a closed stepladder. ?Make sure that both sides of the stepladder are locked into place. ?Ask someone to hold it for you, if possible. ?Clearly mark and make sure that you can see: ?Any grab bars or handrails. ?First and last steps. ?Where the edge of each step is. ?Use tools that help you move around (mobility aids) if they are needed. These  include: ?Canes. ?Walkers. ?Scooters. ?Crutches. ?Turn on the lights when you go into a dark area. Replace any light bulbs as soon as they burn out. ?Set up your furniture so you have a clear path. Avoid moving your furniture around. ?If any of

## 2021-11-21 DIAGNOSIS — D49511 Neoplasm of unspecified behavior of right kidney: Secondary | ICD-10-CM | POA: Diagnosis not present

## 2021-11-28 ENCOUNTER — Other Ambulatory Visit: Payer: Self-pay | Admitting: Family Medicine

## 2021-11-28 DIAGNOSIS — I2694 Multiple subsegmental pulmonary emboli without acute cor pulmonale: Secondary | ICD-10-CM

## 2021-12-15 ENCOUNTER — Inpatient Hospital Stay: Payer: Medicare Other | Attending: Hematology & Oncology

## 2021-12-15 ENCOUNTER — Other Ambulatory Visit: Payer: Self-pay | Admitting: Oncology

## 2021-12-15 ENCOUNTER — Inpatient Hospital Stay: Payer: Medicare Other | Admitting: Family

## 2021-12-15 ENCOUNTER — Encounter: Payer: Self-pay | Admitting: Family

## 2021-12-15 VITALS — BP 160/57 | HR 78 | Temp 98.1°F | Resp 17 | Wt 175.0 lb

## 2021-12-15 DIAGNOSIS — Z7901 Long term (current) use of anticoagulants: Secondary | ICD-10-CM | POA: Diagnosis not present

## 2021-12-15 DIAGNOSIS — Z79899 Other long term (current) drug therapy: Secondary | ICD-10-CM | POA: Insufficient documentation

## 2021-12-15 DIAGNOSIS — I2694 Multiple subsegmental pulmonary emboli without acute cor pulmonale: Secondary | ICD-10-CM

## 2021-12-15 DIAGNOSIS — Z905 Acquired absence of kidney: Secondary | ICD-10-CM | POA: Insufficient documentation

## 2021-12-15 DIAGNOSIS — C641 Malignant neoplasm of right kidney, except renal pelvis: Secondary | ICD-10-CM | POA: Diagnosis not present

## 2021-12-15 DIAGNOSIS — D6862 Lupus anticoagulant syndrome: Secondary | ICD-10-CM | POA: Insufficient documentation

## 2021-12-15 DIAGNOSIS — R760 Raised antibody titer: Secondary | ICD-10-CM

## 2021-12-15 LAB — CBC WITH DIFFERENTIAL (CANCER CENTER ONLY)
Abs Immature Granulocytes: 0.02 10*3/uL (ref 0.00–0.07)
Basophils Absolute: 0 10*3/uL (ref 0.0–0.1)
Basophils Relative: 1 %
Eosinophils Absolute: 0.1 10*3/uL (ref 0.0–0.5)
Eosinophils Relative: 1 %
HCT: 37.3 % — ABNORMAL LOW (ref 39.0–52.0)
Hemoglobin: 12.7 g/dL — ABNORMAL LOW (ref 13.0–17.0)
Immature Granulocytes: 0 %
Lymphocytes Relative: 27 %
Lymphs Abs: 1.6 10*3/uL (ref 0.7–4.0)
MCH: 29.1 pg (ref 26.0–34.0)
MCHC: 34 g/dL (ref 30.0–36.0)
MCV: 85.4 fL (ref 80.0–100.0)
Monocytes Absolute: 0.5 10*3/uL (ref 0.1–1.0)
Monocytes Relative: 8 %
Neutro Abs: 3.8 10*3/uL (ref 1.7–7.7)
Neutrophils Relative %: 63 %
Platelet Count: 237 10*3/uL (ref 150–400)
RBC: 4.37 MIL/uL (ref 4.22–5.81)
RDW: 14.8 % (ref 11.5–15.5)
WBC Count: 6 10*3/uL (ref 4.0–10.5)
nRBC: 0 % (ref 0.0–0.2)

## 2021-12-15 LAB — CMP (CANCER CENTER ONLY)
ALT: 13 U/L (ref 0–44)
AST: 14 U/L — ABNORMAL LOW (ref 15–41)
Albumin: 4.4 g/dL (ref 3.5–5.0)
Alkaline Phosphatase: 57 U/L (ref 38–126)
Anion gap: 5 (ref 5–15)
BUN: 20 mg/dL (ref 8–23)
CO2: 29 mmol/L (ref 22–32)
Calcium: 9.4 mg/dL (ref 8.9–10.3)
Chloride: 107 mmol/L (ref 98–111)
Creatinine: 1.13 mg/dL (ref 0.61–1.24)
GFR, Estimated: >60 mL/min (ref >60)
Glucose, Bld: 85 mg/dL (ref 70–99)
Potassium: 3.9 mmol/L (ref 3.5–5.1)
Sodium: 141 mmol/L (ref 135–145)
Total Bilirubin: 0.6 mg/dL (ref 0.3–1.2)
Total Protein: 7.5 g/dL (ref 6.5–8.1)

## 2021-12-15 NOTE — Progress Notes (Signed)
Hematology and Oncology Follow Up Visit  Phillip Wall [id number] 1950-07-26 71 y.o. 12/15/2021   Principle Diagnosis:  Bilateral subsegmental pulmonary emboli   Current Therapy:        Xarelto 20 mg PO daily   Interim History:  Phillip Wall is here today with his wife for follow-up. He is doing quite well and has no complaints at this time.  He had a laparoscopic partial nephrectomy on 10/30/2021. 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  He has recuperated from surgery nicely and is back to doing the activities he enjoys with only a weight limit of 15 lbs.  No fever, chills, n/v, cough, rash, dizziness, SOB, chest pain, palpitations, abdominal pain or changes in bowel or bladder habits.  No blood loss noted. No bruising or petechiae.  No swelling, tenderness, numbness or tingling in his extremities.  No falls or syncope.  Appetite and hydration are good. Weight is stable at 175 lbs.   ECOG Performance Status: 0 - Asymptomatic  Medications:  Allergies as of 12/15/2021   No Known Allergies      Medication List        Accurate as of December 15, 2021  2:24 PM. If you have any questions, ask your nurse or doctor.          amLODipine 10 MG tablet Commonly known as: NORVASC TAKE 1 TABLET(10 MG) BY MOUTH DAILY   ARTIFICIAL TEAR OP Place 1 drop into both eyes daily as needed (dry eyes).   docusate sodium 100 MG capsule Commonly known as: COLACE Take 1 capsule (100 mg total) by mouth 2 (two) times daily.   famotidine 40 MG tablet Commonly known as: Pepcid Take one daily as needed for indigestion What changed:  how much to take how to take this when to take this reasons to take this additional instructions   traMADol 50 MG tablet Commonly known as: Ultram Take 1-2 tablets (50-100 mg total) by mouth every 6 (six) hours as needed for moderate pain or severe pain.   Xarelto 20 MG Tabs tablet Generic drug: rivaroxaban TAKE 1 TABLET BY MOUTH DAILY WITH  SUPPER        Allergies: No Known Allergies  Past Medical History, Surgical history, Social history, and Family History were reviewed and updated.  Review of Systems: All other 10 point review of systems is negative.   Physical Exam:  weight is 175 lb (79.4 kg). His oral temperature is 98.1 F (36.7 C). His blood pressure is 160/57 (abnormal) and his pulse is 78. His respiration is 17 and oxygen saturation is 100%.   Wt Readings from Last 3 Encounters:  12/15/21 175 lb (79.4 kg)  10/30/21 179 lb 0.2 oz (81.2 kg)  10/19/21 179 lb (81.2 kg)    Ocular: Sclerae unicteric, pupils equal, round and reactive to light Ear-nose-throat: Oropharynx clear, dentition fair Lymphatic: No cervical or supraclavicular adenopathy Lungs no rales or rhonchi, good excursion bilaterally Heart regular rate and rhythm, no murmur appreciated Abd soft, nontender, positive bowel sounds MSK no focal spinal tenderness, no joint edema Neuro: non-focal, well-oriented, appropriate affect Breasts: Deferred   Lab Results  Component Value Date   WBC 6.0 12/15/2021   HGB 12.7 (L) 12/15/2021   HCT 37.3 (L) 12/15/2021   MCV 85.4 12/15/2021   PLT 237 12/15/2021   No results found for: "FERRITIN", "IRON", "TIBC", "UIBC", "IRONPCTSAT" Lab Results  Component Value Date   RBC 4.37 12/15/2021   No results found for: "KPAFRELGTCHN", "LAMBDASER", "  KAPLAMBRATIO" No results found for: "IGGSERUM", "IGA", "IGMSERUM" Lab Results  Component Value Date   ALBUMINELP 4.8 05/10/2021   A1GS 0.3 05/10/2021   A2GS 0.8 05/10/2021   BETS 0.4 05/10/2021   BETA2SER 0.3 05/10/2021   GAMS 1.4 05/10/2021   SPEI  05/10/2021     Comment:     Normal Serum Protein Electrophoresis Pattern. No abnormal protein bands (M-protein) detected.      Chemistry      Component Value Date/Time   NA 141 12/15/2021 1306   NA 140 10/27/2016 0000   K 3.9 12/15/2021 1306   CL 107 12/15/2021 1306   CO2 29 12/15/2021 1306   BUN 20  12/15/2021 1306   BUN 14 10/27/2016 0000   CREATININE 1.13 12/15/2021 1306   GLU 81 10/27/2016 0000      Component Value Date/Time   CALCIUM 9.4 12/15/2021 1306   ALKPHOS 57 12/15/2021 1306   AST 14 (L) 12/15/2021 1306   ALT 13 12/15/2021 1306   BILITOT 0.6 12/15/2021 1306       Impression and Plan: Mr. Housman is a very pleasant 71 yo African American gentleman with new diagnosis of bilateral pulmonary emboli in August. He had positive lupus anticoagulant.  Continue same regimen with full dose Xarelto. Follow-up in 6 months.    Lottie Dawson, NP 6/16/20232:24 PM

## 2021-12-16 LAB — LUPUS ANTICOAGULANT PANEL
DRVVT: 67.1 s — ABNORMAL HIGH (ref 0.0–47.0)
PTT Lupus Anticoagulant: 37.8 s (ref 0.0–43.5)

## 2021-12-16 LAB — DRVVT CONFIRM: dRVVT Confirm: 1.3 ratio — ABNORMAL HIGH (ref 0.8–1.2)

## 2021-12-16 LAB — DRVVT MIX: dRVVT Mix: 61.8 s — ABNORMAL HIGH (ref 0.0–40.4)

## 2021-12-27 ENCOUNTER — Telehealth: Payer: Self-pay | Admitting: *Deleted

## 2021-12-27 NOTE — Telephone Encounter (Signed)
Called and lvm of upcoming appointments - requested callback to confirm 

## 2022-01-08 ENCOUNTER — Ambulatory Visit (INDEPENDENT_AMBULATORY_CARE_PROVIDER_SITE_OTHER): Payer: Medicare Other | Admitting: Family Medicine

## 2022-01-08 ENCOUNTER — Encounter: Payer: Self-pay | Admitting: Family Medicine

## 2022-01-08 VITALS — BP 122/68 | HR 68 | Temp 97.9°F | Ht 73.0 in | Wt 171.6 lb

## 2022-01-08 DIAGNOSIS — Z23 Encounter for immunization: Secondary | ICD-10-CM | POA: Insufficient documentation

## 2022-01-08 DIAGNOSIS — F39 Unspecified mood [affective] disorder: Secondary | ICD-10-CM

## 2022-01-08 DIAGNOSIS — I2694 Multiple subsegmental pulmonary emboli without acute cor pulmonale: Secondary | ICD-10-CM

## 2022-01-08 MED ORDER — CITALOPRAM HYDROBROMIDE 20 MG PO TABS: 20.0000 mg | ORAL_TABLET | Freq: Every day | ORAL | 1 refills | Status: DC

## 2022-01-08 MED ORDER — RIVAROXABAN 20 MG PO TABS: 20.0000 mg | ORAL_TABLET | Freq: Every day | ORAL | 1 refills | Status: DC

## 2022-01-08 NOTE — Progress Notes (Signed)
Established Patient Office Visit  Subjective   Patient ID: Phillip Wall, male    DOB: [birth year]  Age: 71 y.o. MRN: [id number]  Chief Complaint  Patient presents with   Follow-up    6 month follow up, no concerns. Patient fasting.     HPI for follow-up of mood disorder and history of recent PE.  Doing well status post partial nephrectomy for oncocytoma.  Needing Tdap and pneumococcal 20 today.  Life is back to normal for him now.  Taking citalopram as needed and this is working for him.  Communication with daughter improving.  Continue Xarelto for pulmonary embolism.  Follow-up with oncology hematology in December.  Not certain about length of treatment but needs refill today.    Review of Systems  Constitutional: Negative.   HENT: Negative.    Eyes:  Negative for blurred vision, discharge and redness.  Respiratory: Negative.  Negative for cough, hemoptysis and shortness of breath.   Cardiovascular: Negative.   Gastrointestinal:  Negative for abdominal pain.  Genitourinary: Negative.   Musculoskeletal: Negative.  Negative for myalgias.  Skin:  Negative for rash.  Neurological:  Negative for tingling, loss of consciousness and weakness.  Endo/Heme/Allergies:  Negative for polydipsia.  Psychiatric/Behavioral:  Negative for depression. The patient is not nervous/anxious.       Objective:     BP 122/68 (BP Location: Right Arm, Patient Position: Sitting, Cuff Size: Normal)   Pulse 68   Temp 97.9 F (36.6 C) (Temporal)   Ht '6\' 1"'$  (1.854 m)   Wt 171 lb 9.6 oz (77.8 kg)   SpO2 99%   BMI 22.64 kg/m    Physical Exam Constitutional:      General: He is not in acute distress.    Appearance: Normal appearance. He is not ill-appearing, toxic-appearing or diaphoretic.  HENT:     Head: Normocephalic and atraumatic.     Right Ear: External ear normal.     Left Ear: External ear normal.     Mouth/Throat:     Mouth: Mucous membranes are moist.     Pharynx: Oropharynx is  clear. No oropharyngeal exudate or posterior oropharyngeal erythema.  Eyes:     General: No scleral icterus.       Right eye: No discharge.        Left eye: No discharge.     Extraocular Movements: Extraocular movements intact.     Conjunctiva/sclera: Conjunctivae normal.     Pupils: Pupils are equal, round, and reactive to light.  Cardiovascular:     Rate and Rhythm: Normal rate and regular rhythm.  Pulmonary:     Effort: Pulmonary effort is normal. No respiratory distress.     Breath sounds: Normal breath sounds.  Musculoskeletal:     Cervical back: No rigidity or tenderness.  Skin:    General: Skin is warm and dry.  Neurological:     Mental Status: He is alert and oriented to person, place, and time.  Psychiatric:        Mood and Affect: Mood normal.        Behavior: Behavior normal.      No results found for any visits on 01/08/22.    The 10-year ASCVD risk score (Arnett DK, et al., 2019) is: 16.5%    Assessment & Plan:   Problem List Items Addressed This Visit       Cardiovascular and Mediastinum   Pulmonary embolus ([place])   Relevant Medications   rivaroxaban (XARELTO) 20 MG TABS tablet  Other   Mood disorder ([place])   Relevant Medications   citalopram (CELEXA) 20 MG tablet   Need for pneumococcal vaccination - Primary   Relevant Orders   Pneumococcal conjugate vaccine 20-valent (Prevnar 20) (Completed)   Need for Tdap vaccination   Relevant Orders   Tdap vaccine greater than or equal to 7yo IM (Completed)    Return Return for scheduled physical..  Explained that citalopram is a daily medicine.  As needed use seems to be working for him.  Follow-up in December with oncology to discuss length of treatment with Xarelto.  Libby Maw, MD

## 2022-01-19 ENCOUNTER — Ambulatory Visit (INDEPENDENT_AMBULATORY_CARE_PROVIDER_SITE_OTHER): Payer: Medicare Other | Admitting: Family Medicine

## 2022-01-19 ENCOUNTER — Encounter: Payer: Self-pay | Admitting: Family Medicine

## 2022-01-19 VITALS — BP 122/66 | HR 77 | Temp 97.6°F | Ht 73.0 in | Wt 174.0 lb

## 2022-01-19 DIAGNOSIS — I10 Essential (primary) hypertension: Secondary | ICD-10-CM

## 2022-01-19 DIAGNOSIS — Z13220 Encounter for screening for lipoid disorders: Secondary | ICD-10-CM

## 2022-01-19 DIAGNOSIS — Z125 Encounter for screening for malignant neoplasm of prostate: Secondary | ICD-10-CM | POA: Diagnosis not present

## 2022-01-19 DIAGNOSIS — F39 Unspecified mood [affective] disorder: Secondary | ICD-10-CM

## 2022-01-19 DIAGNOSIS — Z0000 Encounter for general adult medical examination without abnormal findings: Secondary | ICD-10-CM

## 2022-01-19 LAB — URINALYSIS, ROUTINE W REFLEX MICROSCOPIC
Bilirubin Urine: NEGATIVE
Hgb urine dipstick: NEGATIVE
Ketones, ur: NEGATIVE
Leukocytes,Ua: NEGATIVE
Nitrite: NEGATIVE
RBC / HPF: NONE SEEN (ref 0–?)
Specific Gravity, Urine: 1.015 (ref 1.000–1.030)
Total Protein, Urine: NEGATIVE
Urine Glucose: NEGATIVE
Urobilinogen, UA: 0.2 (ref 0.0–1.0)
pH: 6 (ref 5.0–8.0)

## 2022-01-19 LAB — LIPID PANEL
Cholesterol: 164 mg/dL (ref 0–200)
HDL: 59.6 mg/dL (ref >39.00)
LDL Cholesterol: 91 mg/dL (ref 0–99)
NonHDL: 104.08
Total CHOL/HDL Ratio: 3
Triglycerides: 66 mg/dL (ref 0.0–149.0)
VLDL: 13.2 mg/dL (ref 0.0–40.0)

## 2022-01-19 LAB — PSA: PSA: 0.54 ng/mL (ref 0.10–4.00)

## 2022-01-19 NOTE — Progress Notes (Signed)
Established Patient Office Visit  Subjective   Patient ID: Phillip Wall, male    DOB: [birth year]  Age: 71 y.o. MRN: [id number]  Chief Complaint  Patient presents with   Annual Exam    CPE, no concerns. Patient fasting.     HPI for complete physical and follow-up of hypertension and mood disorder.  Is in a much better place as his life is a symptoms of normalcy after partial nephrectomy and ongoing treatment for spontaneous PE.  Struggling to find dental care.  Staying active physically by walking daily with his wife.    Review of Systems  Constitutional: Negative.   HENT: Negative.    Eyes:  Negative for blurred vision, discharge and redness.  Respiratory: Negative.    Cardiovascular: Negative.   Gastrointestinal:  Negative for abdominal pain.  Genitourinary: Negative.   Musculoskeletal: Negative.  Negative for myalgias.  Skin:  Negative for rash.  Neurological:  Negative for tingling, loss of consciousness and weakness.  Endo/Heme/Allergies:  Negative for polydipsia.      01/19/2022    9:18 AM 11/15/2021    3:39 PM 11/15/2021    3:37 PM  Depression screen PHQ 2/9  Decreased Interest 0 0 0  Down, Depressed, Hopeless 0 0 0  PHQ - 2 Score 0 0 0        Objective:     BP 122/66 (BP Location: Right Arm, Patient Position: Sitting, Cuff Size: Normal)   Pulse 77   Temp 97.6 F (36.4 C) (Temporal)   Ht '6\' 1"'$  (1.854 m)   Wt 174 lb (78.9 kg)   SpO2 96%   BMI 22.96 kg/m    Physical Exam Constitutional:      General: He is not in acute distress.    Appearance: Normal appearance. He is not ill-appearing, toxic-appearing or diaphoretic.  HENT:     Head: Normocephalic and atraumatic.     Right Ear: External ear normal.     Left Ear: External ear normal.     Mouth/Throat:     Mouth: Mucous membranes are moist.     Pharynx: Oropharynx is clear. No oropharyngeal exudate or posterior oropharyngeal erythema.  Eyes:     General: No scleral icterus.       Right eye:  No discharge.        Left eye: No discharge.     Extraocular Movements: Extraocular movements intact.     Conjunctiva/sclera: Conjunctivae normal.     Pupils: Pupils are equal, round, and reactive to light.  Cardiovascular:     Rate and Rhythm: Normal rate and regular rhythm.  Pulmonary:     Effort: Pulmonary effort is normal. No respiratory distress.     Breath sounds: Normal breath sounds.  Abdominal:     General: Bowel sounds are normal. There is no distension.     Tenderness: There is no abdominal tenderness. There is no guarding.  Genitourinary:    Prostate: Not enlarged, not tender and no nodules present.     Rectum: Guaiac result negative. No mass, tenderness, anal fissure, external hemorrhoid or internal hemorrhoid. Normal anal tone.  Musculoskeletal:     Cervical back: No rigidity or tenderness.  Skin:    General: Skin is warm and dry.  Neurological:     Mental Status: He is alert and oriented to person, place, and time.  Psychiatric:        Mood and Affect: Mood normal.        Behavior: Behavior normal.  No results found for any visits on 01/19/22.    The 10-year ASCVD risk score (Arnett DK, et al., 2019) is: 16.5%    Assessment & Plan:   Problem List Items Addressed This Visit       Cardiovascular and Mediastinum   Essential hypertension   Relevant Orders   Urinalysis, Routine w reflex microscopic     Other   Mood disorder ([place])   Screening for prostate cancer - Primary   Relevant Orders   PSA    Return in about 6 months (around 07/22/2022).  Continue healthy and active lifestyle and follow-up with hematology.  Continue amlodipine and Celexa.  Health maintenance is up-to-date.  Information was still given on health maintenance and disease prevention.  Libby Maw, MD

## 2022-02-14 ENCOUNTER — Encounter: Payer: Self-pay | Admitting: Family Medicine

## 2022-02-15 ENCOUNTER — Telehealth: Payer: Self-pay

## 2022-02-15 ENCOUNTER — Encounter: Payer: Self-pay | Admitting: Family

## 2022-02-15 NOTE — Telephone Encounter (Signed)
Pts wife provided with xarelto patient assistance program information.

## 2022-02-19 ENCOUNTER — Encounter: Payer: Self-pay | Admitting: Family Medicine

## 2022-02-20 ENCOUNTER — Telehealth: Payer: Self-pay

## 2022-02-21 NOTE — Telephone Encounter (Signed)
Error message

## 2022-02-26 ENCOUNTER — Other Ambulatory Visit: Payer: Self-pay | Admitting: Family Medicine

## 2022-02-26 ENCOUNTER — Encounter: Payer: Self-pay | Admitting: Family Medicine

## 2022-02-26 ENCOUNTER — Other Ambulatory Visit: Payer: Self-pay

## 2022-02-26 DIAGNOSIS — K219 Gastro-esophageal reflux disease without esophagitis: Secondary | ICD-10-CM

## 2022-04-18 DIAGNOSIS — L905 Scar conditions and fibrosis of skin: Secondary | ICD-10-CM | POA: Diagnosis not present

## 2022-04-19 ENCOUNTER — Encounter: Payer: Self-pay | Admitting: Family Medicine

## 2022-04-24 ENCOUNTER — Encounter: Payer: Self-pay | Admitting: Family Medicine

## 2022-04-24 ENCOUNTER — Ambulatory Visit (INDEPENDENT_AMBULATORY_CARE_PROVIDER_SITE_OTHER): Payer: Medicare Other | Admitting: Family Medicine

## 2022-04-24 VITALS — BP 150/68 | HR 81 | Temp 98.6°F | Ht 73.0 in | Wt 175.0 lb

## 2022-04-24 DIAGNOSIS — B349 Viral infection, unspecified: Secondary | ICD-10-CM | POA: Diagnosis not present

## 2022-04-24 LAB — POC COVID19 BINAXNOW: SARS Coronavirus 2 Ag: NEGATIVE

## 2022-04-24 MED ORDER — BENZONATATE 200 MG PO CAPS: 200.0000 mg | ORAL_CAPSULE | Freq: Two times a day (BID) | ORAL | 0 refills | Status: DC | PRN

## 2022-04-24 MED ORDER — DM-GUAIFENESIN ER 30-600 MG PO TB12: 1.0000 | ORAL_TABLET | Freq: Two times a day (BID) | ORAL | 0 refills | Status: AC

## 2022-04-24 NOTE — Progress Notes (Signed)
Established Patient Office Visit  Subjective   Patient ID: Phillip Wall, male    DOB: [birth year]  Age: 71 y.o. MRN: [id number]  Chief Complaint  Patient presents with   Sinus Problem    Sinuses, cough, head congestion, watery eyes symptoms x 2 days.     Sinus Problem Associated symptoms include congestion, coughing and a sore throat. Pertinent negatives include no headaches or shortness of breath.   presents with a 3 to 4-day history of watery eyes, runny nose, postnasal drip, mild sore throat, cough and loss of voice.  Denies headaches, fever chills, difficulty breathing wheezing, myalgias or arthralgias.    Review of Systems  Constitutional: Negative.   HENT:  Positive for congestion and sore throat.   Eyes:  Negative for blurred vision, discharge and redness.  Respiratory:  Positive for cough. Negative for shortness of breath and wheezing.   Cardiovascular: Negative.   Gastrointestinal:  Negative for abdominal pain.  Genitourinary: Negative.   Musculoskeletal: Negative.  Negative for joint pain and myalgias.  Skin:  Negative for rash.  Neurological:  Negative for tingling, loss of consciousness, weakness and headaches.  Endo/Heme/Allergies:  Negative for polydipsia.      Objective:     BP (!) 150/68 (BP Location: Right Arm, Patient Position: Sitting, Cuff Size: Normal)   Pulse 81   Temp 98.6 F (37 C) (Temporal)   Ht '6\' 1"'$  (1.854 m)   Wt 175 lb (79.4 kg)   SpO2 98%   BMI 23.09 kg/m    Physical Exam Constitutional:      General: He is not in acute distress.    Appearance: Normal appearance. He is not ill-appearing, toxic-appearing or diaphoretic.  HENT:     Head: Normocephalic and atraumatic.     Right Ear: Tympanic membrane, ear canal and external ear normal.     Left Ear: Tympanic membrane, ear canal and external ear normal.     Mouth/Throat:     Mouth: Mucous membranes are moist.     Pharynx: Oropharynx is clear. No oropharyngeal exudate or posterior  oropharyngeal erythema.  Eyes:     General: No scleral icterus.       Right eye: No discharge.        Left eye: No discharge.     Extraocular Movements: Extraocular movements intact.     Conjunctiva/sclera: Conjunctivae normal.     Pupils: Pupils are equal, round, and reactive to light.  Cardiovascular:     Rate and Rhythm: Normal rate and regular rhythm.  Pulmonary:     Effort: Pulmonary effort is normal. No respiratory distress.     Breath sounds: Normal breath sounds. No wheezing or rales.  Abdominal:     General: Bowel sounds are normal.     Tenderness: There is no abdominal tenderness. There is no guarding.  Musculoskeletal:     Cervical back: No rigidity or tenderness.  Lymphadenopathy:     Cervical: No cervical adenopathy.  Skin:    General: Skin is warm and dry.  Neurological:     Mental Status: He is alert and oriented to person, place, and time.  Psychiatric:        Mood and Affect: Mood normal.        Behavior: Behavior normal.      Results for orders placed or performed in visit on 04/24/22  POC COVID-19  Result Value Ref Range   SARS Coronavirus 2 Ag Negative Negative      The 10-year ASCVD risk  score (Arnett DK, et al., 2019) is: 23.9%    Assessment & Plan:   Problem List Items Addressed This Visit   None Visit Diagnoses     Viral syndrome    -  Primary   Relevant Medications   benzonatate (TESSALON) 200 MG capsule   dextromethorphan-guaiFENesin (MUCINEX DM) 30-600 MG 12hr tablet   Other Relevant Orders   POC COVID-19 (Completed)       Return Will let me know if he is not better by Friday..  Information on viral illnesses was given.   Libby Maw, MD

## 2022-05-01 ENCOUNTER — Encounter: Payer: Self-pay | Admitting: Family Medicine

## 2022-05-04 ENCOUNTER — Encounter: Payer: Self-pay | Admitting: Family Medicine

## 2022-05-05 ENCOUNTER — Encounter (HOSPITAL_BASED_OUTPATIENT_CLINIC_OR_DEPARTMENT_OTHER): Payer: Self-pay | Admitting: Emergency Medicine

## 2022-05-05 ENCOUNTER — Emergency Department (HOSPITAL_BASED_OUTPATIENT_CLINIC_OR_DEPARTMENT_OTHER): Payer: Medicare Other

## 2022-05-05 ENCOUNTER — Emergency Department (HOSPITAL_BASED_OUTPATIENT_CLINIC_OR_DEPARTMENT_OTHER)
Admission: EM | Admit: 2022-05-05 | Discharge: 2022-05-05 | Disposition: A | Discharge status: Home / Self Care | Payer: Medicare Other | Attending: Emergency Medicine | Admitting: Emergency Medicine

## 2022-05-05 ENCOUNTER — Other Ambulatory Visit: Payer: Self-pay

## 2022-05-05 DIAGNOSIS — I10 Essential (primary) hypertension: Secondary | ICD-10-CM | POA: Insufficient documentation

## 2022-05-05 DIAGNOSIS — B349 Viral infection, unspecified: Secondary | ICD-10-CM | POA: Insufficient documentation

## 2022-05-05 DIAGNOSIS — R059 Cough, unspecified: Secondary | ICD-10-CM | POA: Diagnosis not present

## 2022-05-05 DIAGNOSIS — Z7901 Long term (current) use of anticoagulants: Secondary | ICD-10-CM | POA: Diagnosis not present

## 2022-05-05 DIAGNOSIS — Z79899 Other long term (current) drug therapy: Secondary | ICD-10-CM | POA: Insufficient documentation

## 2022-05-05 DIAGNOSIS — R0602 Shortness of breath: Secondary | ICD-10-CM | POA: Diagnosis present

## 2022-05-05 NOTE — Discharge Instructions (Signed)
Please return to the ED with any new or worsening signs or symptoms Please continue treatment regimen at home  Please follow-up with PCP

## 2022-05-05 NOTE — ED Triage Notes (Addendum)
Patient reports shortness of with activity. Some congestion. States that he is coughing up clear phlegm. Congestion in chest

## 2022-05-05 NOTE — ED Provider Notes (Signed)
[facility] DEPARTMENT Provider Note   CSN: [id number] Arrival date & time: 05/05/22  1320     History  Chief Complaint  Patient presents with   Shortness of Breath    congestion    Phillip Wall is a 71 y.o. male with medical history of anxiety, depression, GERD, hypertension.  Patient presents to ED for evaluation of chest congestion.  Patient reports that for the last 1 week she has had increased chest congestion, sore throat.  Patient was seen by his PCP this week and diagnosed with a viral syndrome.  Patient was provided with Ladona Ridgel, Mucinex.  Patient reports he has been taking both his medications with good effect.  Patient reports that his chest congestion has alleviated.  Patient reports that he woke up this morning and "just did not feel right" and so he contacted his PCPs office who redirected him to the ED.  The patient just "wants to make sure that there is no wheezing in my lungs".  Patient denies any fevers, nausea, vomiting, chest pain, shortness of breath.  Patient has no history of asthma, COPD.  Patient does not smoke.   Shortness of Breath Associated symptoms: sore throat   Associated symptoms: no fever and no vomiting        Home Medications Prior to Admission medications   Medication Sig Start Date End Date Taking? Authorizing Provider  amLODipine (NORVASC) 10 MG tablet TAKE 1 TABLET(10 MG) BY MOUTH DAILY 08/31/21   Libby Maw, MD  ARTIFICIAL TEAR OP Place 1 drop into both eyes daily as needed (dry eyes).    [provider]  augmented betamethasone dipropionate (DIPROLENE-AF) 0.05 % cream Apply topically. 08/31/21   [provider]  benzonatate (TESSALON) 200 MG capsule Take 1 capsule (200 mg total) by mouth 2 (two) times daily as needed for cough. 04/24/22   Libby Maw, MD  citalopram (CELEXA) 20 MG tablet Take 1 tablet (20 mg total) by mouth daily. 01/08/22   Libby Maw, MD   docusate sodium (COLACE) 100 MG capsule Take 1 capsule (100 mg total) by mouth 2 (two) times daily. 10/30/21   Debbrah Alar, PA-C  famotidine (PEPCID) 40 MG tablet Take 1 tablet (40 mg total) by mouth daily as needed for indigestion. 02/26/22   Libby Maw, MD  rivaroxaban (XARELTO) 20 MG TABS tablet Take 1 tablet (20 mg total) by mouth daily with supper. 01/08/22   Libby Maw, MD      Allergies    Patient has no known allergies.    Review of Systems   Review of Systems  Constitutional:  Negative for chills and fever.  HENT:  Positive for congestion and sore throat.   Respiratory:  Positive for chest tightness and shortness of breath.   Gastrointestinal:  Negative for nausea and vomiting.  All other systems reviewed and are negative.   Physical Exam Updated Vital Signs BP (!) 148/65   Pulse 64   Temp 98.3 F (36.8 C) (Oral)   Resp 17   SpO2 100%  Physical Exam Vitals and nursing note reviewed.  Constitutional:      General: He is not in acute distress.    Appearance: He is well-developed.  HENT:     Head: Normocephalic and atraumatic.     Mouth/Throat:     Mouth: Mucous membranes are moist.     Pharynx: Oropharynx is clear.  Eyes:     Conjunctiva/sclera: Conjunctivae normal.  Cardiovascular:  Rate and Rhythm: Normal rate and regular rhythm.     Heart sounds: No murmur heard. Pulmonary:     Effort: Pulmonary effort is normal. No respiratory distress.     Breath sounds: Normal breath sounds. No wheezing, rhonchi or rales.  Abdominal:     Palpations: Abdomen is soft.     Tenderness: There is no abdominal tenderness.  Musculoskeletal:        General: No swelling.     Cervical back: Neck supple.  Skin:    General: Skin is warm and dry.     Capillary Refill: Capillary refill takes less than 2 seconds.  Neurological:     Mental Status: He is alert and oriented to person, place, and time.  Psychiatric:        Mood and Affect: Mood normal.      ED Results / Procedures / Treatments   Labs (all labs ordered are listed, but only abnormal results are displayed) Labs Reviewed - No data to display  EKG None  Radiology DG Chest 2 View  Result Date: 05/05/2022 CLINICAL DATA:  Productive cough. EXAM: CHEST - 2 VIEW COMPARISON:  02/15/2021 FINDINGS: The heart size and mediastinal contours are within normal limits. Both lungs are clear. The visualized skeletal structures are unremarkable. IMPRESSION: No active cardiopulmonary disease. Electronically Signed   By: Marlaine Hind M.D.   On: 05/05/2022 14:16    Procedures Procedures   Medications Ordered in ED Medications - No data to display  ED Course/ Medical Decision Making/ A&P                           Medical Decision Making Amount and/or Complexity of Data Reviewed Radiology: ordered.   71 year old male presents to ED for evaluation.  Please see HPI for further details.  On examination patient is afebrile and nontachycardic.  Patient lung sounds are clear bilaterally, he is not wheezing, there is no stridor or rhonchi.  The patient oxygen saturation is 100% on room air.  Patient abdomen soft and compressible throughout.  Patient neurological examination shows no focal neurodeficits.  Patient nontoxic in appearance.  I advised the patient that I did not feel as if the complete work-up was necessary at this time due to the fact he was recently seen by his PCP, his vital signs are very reassuring.  Patient states he presented to rule out any "wheezing" and because of his doctor said that someone should listen to his lung sounds. The patient was offered chest x-ray rule out pneumonia, he agreed.  The patient chest x-ray shows no consolidations or effusions.  The patient vital signs are reassuring.  Patient will be discharged home at this time.  The patient was given return precautions and he voiced understanding.  The patient was advised to continue with his regimen at home,  follow-up with his PCP.  Patient voiced understanding.  Patient stable.  Final Clinical Impression(s) / ED Diagnoses Final diagnoses:  Viral syndrome    Rx / DC Orders ED Discharge Orders     None         Azucena Cecil, PA-C 05/05/22 1448    Davonna Belling, MD 05/06/22 [phone]

## 2022-05-16 DIAGNOSIS — L905 Scar conditions and fibrosis of skin: Secondary | ICD-10-CM | POA: Diagnosis not present

## 2022-06-22 ENCOUNTER — Inpatient Hospital Stay: Payer: Medicare Other

## 2022-06-22 ENCOUNTER — Inpatient Hospital Stay: Payer: Medicare Other | Admitting: Family

## 2022-06-26 ENCOUNTER — Encounter: Payer: Self-pay | Admitting: Family

## 2022-06-26 ENCOUNTER — Inpatient Hospital Stay (HOSPITAL_BASED_OUTPATIENT_CLINIC_OR_DEPARTMENT_OTHER): Payer: Medicare Other | Admitting: Family

## 2022-06-26 ENCOUNTER — Inpatient Hospital Stay: Payer: Medicare Other | Attending: Hematology & Oncology

## 2022-06-26 VITALS — BP 156/74 | HR 77 | Temp 98.2°F | Resp 20 | Ht 74.0 in | Wt 178.8 lb

## 2022-06-26 DIAGNOSIS — R760 Raised antibody titer: Secondary | ICD-10-CM | POA: Diagnosis not present

## 2022-06-26 DIAGNOSIS — D6862 Lupus anticoagulant syndrome: Secondary | ICD-10-CM | POA: Diagnosis not present

## 2022-06-26 DIAGNOSIS — Z7901 Long term (current) use of anticoagulants: Secondary | ICD-10-CM | POA: Insufficient documentation

## 2022-06-26 DIAGNOSIS — C641 Malignant neoplasm of right kidney, except renal pelvis: Secondary | ICD-10-CM

## 2022-06-26 DIAGNOSIS — Z79899 Other long term (current) drug therapy: Secondary | ICD-10-CM | POA: Diagnosis not present

## 2022-06-26 DIAGNOSIS — D6859 Other primary thrombophilia: Secondary | ICD-10-CM

## 2022-06-26 DIAGNOSIS — I2694 Multiple subsegmental pulmonary emboli without acute cor pulmonale: Secondary | ICD-10-CM | POA: Diagnosis not present

## 2022-06-26 LAB — CMP (CANCER CENTER ONLY)
ALT: 14 U/L (ref 0–44)
AST: 19 U/L (ref 15–41)
Albumin: 4.7 g/dL (ref 3.5–5.0)
Alkaline Phosphatase: 63 U/L (ref 38–126)
Anion gap: 10 (ref 5–15)
BUN: 17 mg/dL (ref 8–23)
CO2: 26 mmol/L (ref 22–32)
Calcium: 9.7 mg/dL (ref 8.9–10.3)
Chloride: 104 mmol/L (ref 98–111)
Creatinine: 1.37 mg/dL — ABNORMAL HIGH (ref 0.61–1.24)
GFR, Estimated: 55 mL/min — ABNORMAL LOW (ref >60)
Glucose, Bld: 91 mg/dL (ref 70–99)
Potassium: 3.9 mmol/L (ref 3.5–5.1)
Sodium: 140 mmol/L (ref 135–145)
Total Bilirubin: 0.8 mg/dL (ref 0.3–1.2)
Total Protein: 7.8 g/dL (ref 6.5–8.1)

## 2022-06-26 LAB — CBC WITH DIFFERENTIAL (CANCER CENTER ONLY)
Abs Immature Granulocytes: 0.03 10*3/uL (ref 0.00–0.07)
Basophils Absolute: 0 10*3/uL (ref 0.0–0.1)
Basophils Relative: 0 %
Eosinophils Absolute: 0.1 10*3/uL (ref 0.0–0.5)
Eosinophils Relative: 1 %
HCT: 39.3 % (ref 39.0–52.0)
Hemoglobin: 13.6 g/dL (ref 13.0–17.0)
Immature Granulocytes: 0 %
Lymphocytes Relative: 19 %
Lymphs Abs: 1.7 10*3/uL (ref 0.7–4.0)
MCH: 29.4 pg (ref 26.0–34.0)
MCHC: 34.6 g/dL (ref 30.0–36.0)
MCV: 85.1 fL (ref 80.0–100.0)
Monocytes Absolute: 0.7 10*3/uL (ref 0.1–1.0)
Monocytes Relative: 7 %
Neutro Abs: 6.8 10*3/uL (ref 1.7–7.7)
Neutrophils Relative %: 73 %
Platelet Count: 210 10*3/uL (ref 150–400)
RBC: 4.62 MIL/uL (ref 4.22–5.81)
RDW: 14.5 % (ref 11.5–15.5)
WBC Count: 9.4 10*3/uL (ref 4.0–10.5)
nRBC: 0 % (ref 0.0–0.2)

## 2022-06-26 LAB — LACTATE DEHYDROGENASE: LDH: 135 U/L (ref 98–192)

## 2022-06-26 NOTE — Progress Notes (Signed)
Hematology and Oncology Follow Up Visit  Phillip Wall [id number] 1950/12/16 71 y.o. 06/26/2022   Principle Diagnosis:  Bilateral subsegmental pulmonary emboli Positive lupus anticoagulant   Current Therapy:        Xarelto 20 mg PO daily    Interim History:  Phillip Wall is here today for follow-up. He is doing quite well and had a wonderful time with his family and sweet great grandson Berneice Gandy for Christmas.  He has done well on Xarelto 20 mg PO daily.  No blood loss, bruising or petechiae noted.  No fever, chills, n/v, cough, rash, dizziness, SOB, chest pain, palpitations, abdominal pain or changes in bowel or bladder habits.  No swelling, tenderness, numbness or tingling in his extremities.  No falls or syncope.  Appetite and hydration are good. Weight is stable at 178 lbs.   ECOG Performance Status: 0 - Asymptomatic  Medications:  Allergies as of 06/26/2022   No Known Allergies      Medication List        Accurate as of June 26, 2022  1:28 PM. If you have any questions, ask your nurse or doctor.          amLODipine 10 MG tablet Commonly known as: NORVASC TAKE 1 TABLET(10 MG) BY MOUTH DAILY   ARTIFICIAL TEAR OP Place 1 drop into both eyes daily as needed (dry eyes).   augmented betamethasone dipropionate 0.05 % cream Commonly known as: DIPROLENE-AF Apply topically.   benzonatate 200 MG capsule Commonly known as: TESSALON Take 1 capsule (200 mg total) by mouth 2 (two) times daily as needed for cough.   citalopram 20 MG tablet Commonly known as: CELEXA Take 1 tablet (20 mg total) by mouth daily.   docusate sodium 100 MG capsule Commonly known as: COLACE Take 1 capsule (100 mg total) by mouth 2 (two) times daily.   famotidine 40 MG tablet Commonly known as: PEPCID Take 1 tablet (40 mg total) by mouth daily as needed for indigestion.   rivaroxaban 20 MG Tabs tablet Commonly known as: Xarelto Take 1 tablet (20 mg total) by mouth daily with  supper.        Allergies: No Known Allergies  Past Medical History, Surgical history, Social history, and Family History were reviewed and updated.  Review of Systems: All other 10 point review of systems is negative.   Physical Exam:  vitals were not taken for this visit.   Wt Readings from Last 3 Encounters:  04/24/22 175 lb (79.4 kg)  01/19/22 174 lb (78.9 kg)  01/08/22 171 lb 9.6 oz (77.8 kg)    Ocular: Sclerae unicteric, pupils equal, round and reactive to light Ear-nose-throat: Oropharynx clear, dentition fair Lymphatic: No cervical or supraclavicular adenopathy Lungs no rales or rhonchi, good excursion bilaterally Heart regular rate and rhythm, no murmur appreciated Abd soft, nontender, positive bowel sounds MSK no focal spinal tenderness, no joint edema Neuro: non-focal, well-oriented, appropriate affect Breasts: Deferred   Lab Results  Component Value Date   WBC 9.4 06/26/2022   HGB 13.6 06/26/2022   HCT 39.3 06/26/2022   MCV 85.1 06/26/2022   PLT 210 06/26/2022   No results found for: "FERRITIN", "IRON", "TIBC", "UIBC", "IRONPCTSAT" Lab Results  Component Value Date   RBC 4.62 06/26/2022   No results found for: "KPAFRELGTCHN", "LAMBDASER", "KAPLAMBRATIO" No results found for: "IGGSERUM", "IGA", "IGMSERUM" Lab Results  Component Value Date   ALBUMINELP 4.8 05/10/2021   A1GS 0.3 05/10/2021   A2GS 0.8 05/10/2021   BETS 0.4  05/10/2021   BETA2SER 0.3 05/10/2021   GAMS 1.4 05/10/2021   SPEI  05/10/2021     Comment:     Normal Serum Protein Electrophoresis Pattern. No abnormal protein bands (M-protein) detected.      Chemistry      Component Value Date/Time   NA 141 12/15/2021 1306   NA 140 10/27/2016 0000   K 3.9 12/15/2021 1306   CL 107 12/15/2021 1306   CO2 29 12/15/2021 1306   BUN 20 12/15/2021 1306   BUN 14 10/27/2016 0000   CREATININE 1.13 12/15/2021 1306   GLU 81 10/27/2016 0000      Component Value Date/Time   CALCIUM 9.4  12/15/2021 1306   ALKPHOS 57 12/15/2021 1306   AST 14 (L) 12/15/2021 1306   ALT 13 12/15/2021 1306   BILITOT 0.6 12/15/2021 1306       Impression and Plan:  Phillip Wall is a very pleasant 71 yo African American gentleman with new diagnosis of bilateral pulmonary emboli in August. He had positive lupus anticoagulant.  From our standpoint we are ok to reduce his Xarelto to maintenance dose of 10 mg PO daily if Dr. Ethelene Hal is also ok with this.  Lab only in 6 months, follow-up in 1 year.   Lottie Dawson, NP 12/26/20231:28 PM

## 2022-06-27 LAB — DRVVT MIX: dRVVT Mix: 49.6 s — ABNORMAL HIGH (ref 0.0–40.4)

## 2022-06-27 LAB — DRVVT CONFIRM: dRVVT Confirm: 1.2 ratio (ref 0.8–1.2)

## 2022-06-27 LAB — LUPUS ANTICOAGULANT PANEL
DRVVT: 55.3 s — ABNORMAL HIGH (ref 0.0–47.0)
PTT Lupus Anticoagulant: 34 s (ref 0.0–43.5)

## 2022-07-23 ENCOUNTER — Encounter: Payer: Self-pay | Admitting: Family Medicine

## 2022-07-23 ENCOUNTER — Ambulatory Visit (INDEPENDENT_AMBULATORY_CARE_PROVIDER_SITE_OTHER): Payer: Medicare Other | Admitting: Family Medicine

## 2022-07-23 VITALS — BP 134/66 | HR 78 | Temp 97.8°F | Ht 74.0 in | Wt 175.2 lb

## 2022-07-23 DIAGNOSIS — Z86711 Personal history of pulmonary embolism: Secondary | ICD-10-CM

## 2022-07-23 DIAGNOSIS — I10 Essential (primary) hypertension: Secondary | ICD-10-CM

## 2022-07-23 NOTE — Progress Notes (Signed)
Established Patient Office Visit   Subjective:  Patient ID: Phillip Wall, male    DOB: 1951/05/24  Age: 72 y.o. MRN: [id number]  Chief Complaint  Patient presents with   Follow-up    6 month follow up, discuss increasing BP medication and decreasing Xarelto. Patient fasting.     HPI Encounter Diagnoses  Name Primary?   Essential hypertension Yes   History of pulmonary embolism    For follow-up of hypertension and ongoing use of Xarelto 20 mg daily status post history of pulmonary embolism.  He is taking Xarelto 20 mg daily for a year now.  Coagulation workup by hematology was essentially negative.  He has a favorable lipid profile.  Blood pressure is well-controlled on current regimen.  He does not smoke.  He is at low risk for other vascular disease.  Follow-up with hematology as planned for May.   Review of Systems  Constitutional: Negative.   HENT: Negative.    Eyes:  Negative for blurred vision, discharge and redness.  Respiratory: Negative.    Cardiovascular: Negative.   Gastrointestinal:  Negative for abdominal pain.  Genitourinary: Negative.   Musculoskeletal: Negative.  Negative for myalgias.  Skin:  Negative for rash.  Neurological:  Negative for tingling, loss of consciousness and weakness.  Endo/Heme/Allergies:  Negative for polydipsia.     Current Outpatient Medications:    amLODipine (NORVASC) 10 MG tablet, TAKE 1 TABLET(10 MG) BY MOUTH DAILY, Disp: 90 tablet, Rfl: 3   ARTIFICIAL TEAR OP, Place 1 drop into both eyes daily as needed (dry eyes)., Disp: , Rfl:    citalopram (CELEXA) 20 MG tablet, Take 1 tablet (20 mg total) by mouth daily., Disp: 90 tablet, Rfl: 1   docusate sodium (COLACE) 100 MG capsule, Take 1 capsule (100 mg total) by mouth 2 (two) times daily., Disp: , Rfl:    famotidine (PEPCID) 40 MG tablet, Take 1 tablet (40 mg total) by mouth daily as needed for indigestion., Disp: 90 tablet, Rfl: 1   rivaroxaban (XARELTO) 20 MG TABS tablet, Take 1  tablet (20 mg total) by mouth daily with supper., Disp: 90 tablet, Rfl: 1   Objective:     BP 134/66 (BP Location: Right Arm, Patient Position: Sitting, Cuff Size: Normal)   Pulse 78   Temp 97.8 F (36.6 C) (Temporal)   Ht '6\' 2"'$  (1.88 m)   Wt 175 lb 3.2 oz (79.5 kg)   SpO2 97%   BMI 22.49 kg/m    Physical Exam Constitutional:      General: He is not in acute distress.    Appearance: Normal appearance. He is not ill-appearing, toxic-appearing or diaphoretic.  HENT:     Head: Normocephalic and atraumatic.     Right Ear: External ear normal.     Left Ear: External ear normal.  Eyes:     General: No scleral icterus.       Right eye: No discharge.        Left eye: No discharge.     Extraocular Movements: Extraocular movements intact.     Conjunctiva/sclera: Conjunctivae normal.  Cardiovascular:     Rate and Rhythm: Normal rate and regular rhythm.  Pulmonary:     Effort: Pulmonary effort is normal. No respiratory distress.     Breath sounds: Normal breath sounds.  Musculoskeletal:     Cervical back: No rigidity or tenderness.  Skin:    General: Skin is warm and dry.  Neurological:     Mental Status: He is  alert and oriented to person, place, and time.  Psychiatric:        Mood and Affect: Mood normal.        Behavior: Behavior normal.      No results found for any visits on 07/23/22.    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  Coagulation workup negative.  He has no evidence for vascular disease.  He has a favorable lipid profile.  Blood pressure well-controlled.  Believe that he could discontinue Xarelto from vascular viewpoint.  Need hematology input as to whether or not he would need to continue the medication for prophylaxis of a future PE.  Could consider starting a statin regarding his  elevated ASCVD risk score.  Return Return for scheduled physical in June.Libby Maw, MD

## 2022-07-27 ENCOUNTER — Encounter: Payer: Self-pay | Admitting: Family Medicine

## 2022-07-27 ENCOUNTER — Ambulatory Visit (INDEPENDENT_AMBULATORY_CARE_PROVIDER_SITE_OTHER): Payer: Medicare Other | Admitting: Family Medicine

## 2022-07-27 VITALS — BP 158/70 | HR 74 | Temp 98.2°F | Ht 74.0 in | Wt 176.0 lb

## 2022-07-27 DIAGNOSIS — M5442 Lumbago with sciatica, left side: Secondary | ICD-10-CM | POA: Diagnosis not present

## 2022-07-27 MED ORDER — PREDNISONE 10 MG PO TABS: 10.0000 mg | ORAL_TABLET | Freq: Two times a day (BID) | ORAL | 0 refills | Status: AC

## 2022-07-27 MED ORDER — TIZANIDINE HCL 4 MG PO TABS: 4.0000 mg | ORAL_TABLET | Freq: Three times a day (TID) | ORAL | 0 refills | Status: DC | PRN

## 2022-07-27 NOTE — Progress Notes (Signed)
Established Patient Office Visit   Subjective:  Patient ID: Phillip Wall, male    DOB: 1951-02-14  Age: 72 y.o. MRN: [id number]  Chief Complaint  Patient presents with   Hip Pain    Left hip pain spreading to leg x 10 days becoming worse more present with movement.     Hip Pain  Pertinent negatives include no tingling.   Encounter Diagnoses  Name Primary?   Acute bilateral low back pain with left-sided sciatica Yes   1 to 2-week history of pain in his left lower back that has been associated with pain in his lateral leg.  There was no injury.  He does have a history of ongoing intermittent lower back pain and is usually able to stretch it out.  He denies weakness numbness or tingling in his lower extremities.  There have been no saddle paresthesias.  No changes in his bowel or bladder continence.   Review of Systems  Constitutional: Negative.   HENT: Negative.    Eyes:  Negative for blurred vision, discharge and redness.  Respiratory: Negative.    Cardiovascular: Negative.   Gastrointestinal:  Negative for abdominal pain.  Genitourinary: Negative.   Musculoskeletal:  Positive for back pain. Negative for myalgias.  Skin:  Negative for rash.  Neurological:  Negative for tingling, loss of consciousness and weakness.  Endo/Heme/Allergies:  Negative for polydipsia.     Current Outpatient Medications:    amLODipine (NORVASC) 10 MG tablet, TAKE 1 TABLET(10 MG) BY MOUTH DAILY, Disp: 90 tablet, Rfl: 3   ARTIFICIAL TEAR OP, Place 1 drop into both eyes daily as needed (dry eyes)., Disp: , Rfl:    citalopram (CELEXA) 20 MG tablet, Take 1 tablet (20 mg total) by mouth daily., Disp: 90 tablet, Rfl: 1   docusate sodium (COLACE) 100 MG capsule, Take 1 capsule (100 mg total) by mouth 2 (two) times daily., Disp: , Rfl:    famotidine (PEPCID) 40 MG tablet, Take 1 tablet (40 mg total) by mouth daily as needed for indigestion., Disp: 90 tablet, Rfl: 1   predniSONE (DELTASONE) 10 MG tablet,  Take 1 tablet (10 mg total) by mouth 2 (two) times daily with a meal for 5 days., Disp: 10 tablet, Rfl: 0   rivaroxaban (XARELTO) 20 MG TABS tablet, Take 1 tablet (20 mg total) by mouth daily with supper., Disp: 90 tablet, Rfl: 1   tiZANidine (ZANAFLEX) 4 MG tablet, Take 1 tablet (4 mg total) by mouth every 8 (eight) hours as needed for muscle spasms., Disp: 30 tablet, Rfl: 0   Objective:     BP (!) 158/70 (BP Location: Right Arm, Patient Position: Sitting, Cuff Size: Normal)   Pulse 74   Temp 98.2 F (36.8 C) (Temporal)   Ht '6\' 2"'$  (1.88 m)   Wt 176 lb (79.8 kg)   SpO2 98%   BMI 22.60 kg/m  BP Readings from Last 3 Encounters:  07/27/22 (!) 158/70  07/23/22 134/66  06/26/22 (!) 156/74   Wt Readings from Last 3 Encounters:  07/27/22 176 lb (79.8 kg)  07/23/22 175 lb 3.2 oz (79.5 kg)  06/26/22 178 lb 12.8 oz (81.1 kg)      Physical Exam Constitutional:      General: He is not in acute distress.    Appearance: Normal appearance. He is not ill-appearing, toxic-appearing or diaphoretic.  HENT:     Head: Normocephalic and atraumatic.     Right Ear: External ear normal.     Left Ear: External  ear normal.  Eyes:     General: No scleral icterus.       Right eye: No discharge.        Left eye: No discharge.     Extraocular Movements: Extraocular movements intact.     Conjunctiva/sclera: Conjunctivae normal.  Pulmonary:     Effort: Pulmonary effort is normal. No respiratory distress.  Musculoskeletal:     Lumbar back: No tenderness or bony tenderness. Normal range of motion. Negative right straight leg raise test and negative left straight leg raise test.  Skin:    General: Skin is warm and dry.  Neurological:     Mental Status: He is alert and oriented to person, place, and time.  Psychiatric:        Mood and Affect: Mood normal.        Behavior: Behavior normal.      No results found for any visits on 07/27/22.    The 10-year ASCVD risk score (Arnett DK, et al.,  2019) is: 26%    Assessment & Plan:   Acute bilateral low back pain with left-sided sciatica -     predniSONE; Take 1 tablet (10 mg total) by mouth 2 (two) times daily with a meal for 5 days.  Dispense: 10 tablet; Refill: 0 -     tiZANidine HCl; Take 1 tablet (4 mg total) by mouth every 8 (eight) hours as needed for muscle spasms.  Dispense: 30 tablet; Refill: 0    Return in about 1 month (around 08/27/2022), or if symptoms worsen or fail to improve.  Prednisone 10 mg twice daily for 5 days.  Tizanidine 4 mg every 8 as needed.  Drowsy cautions given.  Given size stretching exercises to do.  Libby Maw, MD

## 2022-08-14 DIAGNOSIS — H25813 Combined forms of age-related cataract, bilateral: Secondary | ICD-10-CM | POA: Diagnosis not present

## 2022-08-14 DIAGNOSIS — H524 Presbyopia: Secondary | ICD-10-CM | POA: Diagnosis not present

## 2022-08-14 DIAGNOSIS — H40013 Open angle with borderline findings, low risk, bilateral: Secondary | ICD-10-CM | POA: Diagnosis not present

## 2022-08-14 DIAGNOSIS — H04123 Dry eye syndrome of bilateral lacrimal glands: Secondary | ICD-10-CM | POA: Diagnosis not present

## 2022-08-14 DIAGNOSIS — H11153 Pinguecula, bilateral: Secondary | ICD-10-CM | POA: Diagnosis not present

## 2022-08-20 DIAGNOSIS — L910 Hypertrophic scar: Secondary | ICD-10-CM | POA: Diagnosis not present

## 2022-08-20 DIAGNOSIS — L821 Other seborrheic keratosis: Secondary | ICD-10-CM | POA: Diagnosis not present

## 2022-08-20 DIAGNOSIS — D2239 Melanocytic nevi of other parts of face: Secondary | ICD-10-CM | POA: Diagnosis not present

## 2022-08-27 ENCOUNTER — Ambulatory Visit: Payer: Medicare Other | Admitting: Family Medicine

## 2022-09-07 ENCOUNTER — Other Ambulatory Visit: Payer: Self-pay | Admitting: Family Medicine

## 2022-09-07 DIAGNOSIS — I10 Essential (primary) hypertension: Secondary | ICD-10-CM

## 2022-09-11 NOTE — Telephone Encounter (Signed)
LVM for pt to call the office to schedule a HTN follow-up appointment.

## 2022-09-17 ENCOUNTER — Encounter: Payer: Self-pay | Admitting: Family Medicine

## 2022-09-17 ENCOUNTER — Ambulatory Visit (INDEPENDENT_AMBULATORY_CARE_PROVIDER_SITE_OTHER): Payer: Medicare Other | Admitting: Family Medicine

## 2022-09-17 VITALS — BP 140/70 | HR 69 | Temp 98.0°F | Ht 74.0 in | Wt 175.4 lb

## 2022-09-17 DIAGNOSIS — I10 Essential (primary) hypertension: Secondary | ICD-10-CM | POA: Diagnosis not present

## 2022-09-17 MED ORDER — TELMISARTAN 20 MG PO TABS: 10.0000 mg | ORAL_TABLET | Freq: Every day | ORAL | 0 refills | Status: DC

## 2022-09-17 MED ORDER — AMLODIPINE BESYLATE 10 MG PO TABS: ORAL_TABLET | ORAL | 0 refills | Status: DC

## 2022-09-17 NOTE — Progress Notes (Signed)
Established Patient Office Visit   Subjective:  Patient ID: Phillip Wall, male    DOB: June 15, 1951  Age: 72 y.o. MRN: [id number]  Chief Complaint  Patient presents with   Hypertension    Follow up on BP, per patient levels still elevated most of the time.     Hypertension Pertinent negatives include no blurred vision, headaches or shortness of breath.   Encounter Diagnoses  Name Primary?   Essential hypertension Yes   For follow-up of hypertension.  Doing well with amlodipine 10 mg.  No issues.  Denies swelling in his lower extremities.  Blood pressure at home has been running in the 140s to 150s over 70s.  He denies headaches shortness of breath or blurred vision.   Review of Systems  Constitutional: Negative.   HENT: Negative.    Eyes:  Negative for blurred vision, discharge and redness.  Respiratory: Negative.  Negative for shortness of breath.   Cardiovascular: Negative.   Gastrointestinal:  Negative for abdominal pain.  Genitourinary: Negative.   Musculoskeletal: Negative.  Negative for myalgias.  Skin:  Negative for rash.  Neurological:  Negative for tingling, loss of consciousness, weakness and headaches.  Endo/Heme/Allergies:  Negative for polydipsia.     Current Outpatient Medications:    ARTIFICIAL TEAR OP, Place 1 drop into both eyes daily as needed (dry eyes)., Disp: , Rfl:    citalopram (CELEXA) 20 MG tablet, Take 1 tablet (20 mg total) by mouth daily., Disp: 90 tablet, Rfl: 1   docusate sodium (COLACE) 100 MG capsule, Take 1 capsule (100 mg total) by mouth 2 (two) times daily., Disp: , Rfl:    famotidine (PEPCID) 40 MG tablet, Take 1 tablet (40 mg total) by mouth daily as needed for indigestion., Disp: 90 tablet, Rfl: 1   rivaroxaban (XARELTO) 20 MG TABS tablet, Take 1 tablet (20 mg total) by mouth daily with supper., Disp: 90 tablet, Rfl: 1   telmisartan (MICARDIS) 20 MG tablet, Take 0.5 tablets (10 mg total) by mouth daily., Disp: 45 tablet, Rfl: 0    tiZANidine (ZANAFLEX) 4 MG tablet, Take 1 tablet (4 mg total) by mouth every 8 (eight) hours as needed for muscle spasms., Disp: 30 tablet, Rfl: 0   amLODipine (NORVASC) 10 MG tablet, TAKE 1 TABLET(10 MG) BY MOUTH DAILY, Disp: 90 tablet, Rfl: 0   Objective:     BP (!) 142/66 (BP Location: Left Arm, Patient Position: Sitting, Cuff Size: Normal)   Pulse 69   Temp 98 F (36.7 C) (Temporal)   Ht 6\' 2"  (1.88 m)   Wt 175 lb 6.4 oz (79.6 kg)   SpO2 97%   BMI 22.52 kg/m    Physical Exam Constitutional:      General: He is not in acute distress.    Appearance: Normal appearance. He is not ill-appearing, toxic-appearing or diaphoretic.  HENT:     Head: Normocephalic and atraumatic.     Right Ear: External ear normal.     Left Ear: External ear normal.  Eyes:     General: No scleral icterus.       Right eye: No discharge.        Left eye: No discharge.     Extraocular Movements: Extraocular movements intact.     Conjunctiva/sclera: Conjunctivae normal.  Cardiovascular:     Rate and Rhythm: Normal rate and regular rhythm.  Pulmonary:     Effort: Pulmonary effort is normal. No respiratory distress.     Breath sounds: Normal breath sounds.  Skin:    General: Skin is warm and dry.  Neurological:     Mental Status: He is alert and oriented to person, place, and time.  Psychiatric:        Mood and Affect: Mood normal.        Behavior: Behavior normal.      No results found for any visits on 09/17/22.    The 10-year ASCVD risk score (Arnett DK, et al., 2019) is: 21.8%    Assessment & Plan:   Essential hypertension -     Telmisartan; Take 0.5 tablets (10 mg total) by mouth daily.  Dispense: 45 tablet; Refill: 0 -     amLODIPine Besylate; TAKE 1 TABLET(10 MG) BY MOUTH DAILY  Dispense: 90 tablet; Refill: 0    Return in about 4 weeks (around 10/15/2022).  Continue amlodipine.  Add 10 mg of Micardis.  Recheck in 4 weeks  Libby Maw, MD

## 2022-09-18 ENCOUNTER — Other Ambulatory Visit: Payer: Self-pay

## 2022-09-18 DIAGNOSIS — I2694 Multiple subsegmental pulmonary emboli without acute cor pulmonale: Secondary | ICD-10-CM

## 2022-09-18 MED ORDER — RIVAROXABAN 20 MG PO TABS: 20.0000 mg | ORAL_TABLET | Freq: Every day | ORAL | 1 refills | Status: DC

## 2022-10-22 ENCOUNTER — Ambulatory Visit (INDEPENDENT_AMBULATORY_CARE_PROVIDER_SITE_OTHER): Payer: Medicare Other | Admitting: Family Medicine

## 2022-10-22 ENCOUNTER — Encounter: Payer: Self-pay | Admitting: Family Medicine

## 2022-10-22 VITALS — BP 140/68 | HR 58 | Temp 98.1°F | Ht 74.0 in | Wt 179.2 lb

## 2022-10-22 DIAGNOSIS — I10 Essential (primary) hypertension: Secondary | ICD-10-CM

## 2022-10-22 DIAGNOSIS — I2694 Multiple subsegmental pulmonary emboli without acute cor pulmonale: Secondary | ICD-10-CM | POA: Diagnosis not present

## 2022-10-22 MED ORDER — AMLODIPINE BESYLATE 10 MG PO TABS: ORAL_TABLET | ORAL | 1 refills | Status: DC

## 2022-10-22 MED ORDER — TELMISARTAN 20 MG PO TABS: 20.0000 mg | ORAL_TABLET | Freq: Every day | ORAL | 1 refills | Status: DC

## 2022-10-22 MED ORDER — RIVAROXABAN 20 MG PO TABS: 20.0000 mg | ORAL_TABLET | Freq: Every day | ORAL | 0 refills | Status: DC

## 2022-10-22 NOTE — Progress Notes (Signed)
Established Patient Office Visit   Subjective:  Patient ID: Phillip Wall, male    DOB: 04/28/1951  Age: 72 y.o. MRN: [id number]  Chief Complaint  Patient presents with   Medical Management of Chronic Issues    Follow up on BP, no concerns patient states that BP still in the high 140's at times at home.     HPI Encounter Diagnoses  Name Primary?   Essential hypertension Yes   Multiple subsegmental pulmonary emboli without acute cor pulmonale    For follow-up of hypertension status post starting 10 mg telmisartan.  He has done well with the medication.  Denies chronic cough.  Blood pressure at home has been in the upper 130s to 140s.  Request refill for Xarelto with history of unprovoked pulmonary embolism with history of renal cell carcinoma.   Review of Systems  Constitutional: Negative.   HENT: Negative.    Eyes:  Negative for blurred vision, discharge and redness.  Respiratory: Negative.  Negative for cough.   Cardiovascular: Negative.   Gastrointestinal:  Negative for abdominal pain.  Genitourinary: Negative.   Musculoskeletal: Negative.  Negative for myalgias.  Skin:  Negative for rash.  Neurological:  Negative for tingling, loss of consciousness and weakness.  Endo/Heme/Allergies:  Negative for polydipsia.     Current Outpatient Medications:    ARTIFICIAL TEAR OP, Place 1 drop into both eyes daily as needed (dry eyes)., Disp: , Rfl:    citalopram (CELEXA) 20 MG tablet, Take 1 tablet (20 mg total) by mouth daily., Disp: 90 tablet, Rfl: 1   docusate sodium (COLACE) 100 MG capsule, Take 1 capsule (100 mg total) by mouth 2 (two) times daily., Disp: , Rfl:    famotidine (PEPCID) 40 MG tablet, Take 1 tablet (40 mg total) by mouth daily as needed for indigestion., Disp: 90 tablet, Rfl: 1   tiZANidine (ZANAFLEX) 4 MG tablet, Take 1 tablet (4 mg total) by mouth every 8 (eight) hours as needed for muscle spasms., Disp: 30 tablet, Rfl: 0   amLODipine (NORVASC) 10 MG tablet,  TAKE 1 TABLET(10 MG) BY MOUTH DAILY, Disp: 90 tablet, Rfl: 1   rivaroxaban (XARELTO) 20 MG TABS tablet, Take 1 tablet (20 mg total) by mouth daily with supper., Disp: 90 tablet, Rfl: 0   telmisartan (MICARDIS) 20 MG tablet, Take 1 tablet (20 mg total) by mouth daily., Disp: 90 tablet, Rfl: 1   Objective:     BP (!) 140/68 (BP Location: Right Arm, Patient Position: Sitting, Cuff Size: Normal)   Pulse (!) 58   Temp 98.1 F (36.7 C) (Temporal)   Ht  (1.88 m)   Wt 179 lb 3.2 oz (81.3 kg)   SpO2 98%   BMI 23.01 kg/m    Physical Exam Constitutional:      General: He is not in acute distress.    Appearance: Normal appearance. He is not ill-appearing, toxic-appearing or diaphoretic.  HENT:     Head: Normocephalic and atraumatic.     Right Ear: External ear normal.     Left Ear: External ear normal.  Eyes:     General: No scleral icterus.       Right eye: No discharge.        Left eye: No discharge.     Extraocular Movements: Extraocular movements intact.     Conjunctiva/sclera: Conjunctivae normal.  Cardiovascular:     Rate and Rhythm: Normal rate and regular rhythm.  Pulmonary:     Effort: Pulmonary effort is normal.  No respiratory distress.     Breath sounds: Normal breath sounds.  Skin:    General: Skin is warm and dry.  Neurological:     Mental Status: He is alert and oriented to person, place, and time.  Psychiatric:        Mood and Affect: Mood normal.        Behavior: Behavior normal.      No results found for any visits on 10/22/22.    The 10-year ASCVD risk score (Arnett DK, et al., 2019) is: 21.3%    Assessment & Plan:   Essential hypertension -     Telmisartan; Take 1 tablet (20 mg total) by mouth daily.  Dispense: 90 tablet; Refill: 1 -     amLODIPine Besylate; TAKE 1 TABLET(10 MG) BY MOUTH DAILY  Dispense: 90 tablet; Refill: 1 -     Basic metabolic panel  Multiple subsegmental pulmonary emboli without acute cor pulmonale -     Rivaroxaban; Take  1 tablet (20 mg total) by mouth daily with supper.  Dispense: 90 tablet; Refill: 0    Return in about 3 months (around 01/21/2023).  Have increased telmisartan 20 mg daily.  Continue amlodipine.  Follow-up with hematology for pulmonary embolism.  Refilled his rivaroxaban 1 more time with the understanding that I would need hematology recommendation for further refills and recommended length of treatment with his history of primary unprovoked pulmonary embolism with history of renal cell carcinoma.   Mliss Sax, MD

## 2022-10-23 ENCOUNTER — Encounter: Payer: Self-pay | Admitting: Family Medicine

## 2022-10-23 ENCOUNTER — Encounter: Payer: Self-pay | Admitting: Family

## 2022-10-23 LAB — BASIC METABOLIC PANEL
BUN: 16 mg/dL (ref 6–23)
CO2: 29 mEq/L (ref 19–32)
Calcium: 9.4 mg/dL (ref 8.4–10.5)
Chloride: 105 mEq/L (ref 96–112)
Creatinine, Ser: 1.33 mg/dL (ref 0.40–1.50)
GFR: 53.38 mL/min — ABNORMAL LOW (ref >60.00)
Glucose, Bld: 84 mg/dL (ref 70–99)
Potassium: 4.1 mEq/L (ref 3.5–5.1)
Sodium: 139 mEq/L (ref 135–145)

## 2022-10-24 ENCOUNTER — Other Ambulatory Visit: Payer: Self-pay

## 2022-10-24 DIAGNOSIS — I2694 Multiple subsegmental pulmonary emboli without acute cor pulmonale: Secondary | ICD-10-CM

## 2022-10-24 MED ORDER — RIVAROXABAN 20 MG PO TABS
20.0000 mg | ORAL_TABLET | Freq: Every day | ORAL | 0 refills | Status: DC
Start: 2022-10-24 — End: 2022-10-25

## 2022-10-24 NOTE — Telephone Encounter (Signed)
Called patient for clarity on pharmacy for requested refill. Added preferred pharmacy refill sent in.

## 2022-10-25 ENCOUNTER — Encounter: Payer: Self-pay | Admitting: Family

## 2022-10-25 ENCOUNTER — Other Ambulatory Visit: Payer: Self-pay | Admitting: *Deleted

## 2022-10-25 DIAGNOSIS — I2694 Multiple subsegmental pulmonary emboli without acute cor pulmonale: Secondary | ICD-10-CM

## 2022-10-25 MED ORDER — RIVAROXABAN 10 MG PO TABS: 10.0000 mg | ORAL_TABLET | Freq: Every day | ORAL | 3 refills | Status: DC

## 2022-11-19 ENCOUNTER — Ambulatory Visit (INDEPENDENT_AMBULATORY_CARE_PROVIDER_SITE_OTHER): Payer: Medicare Other

## 2022-11-19 VITALS — Ht 73.0 in | Wt 177.0 lb

## 2022-11-19 DIAGNOSIS — Z0000 Encounter for general adult medical examination without abnormal findings: Secondary | ICD-10-CM | POA: Diagnosis not present

## 2022-11-19 NOTE — Patient Instructions (Signed)
[name] , Thank you for taking time to come for your Medicare Wellness Visit. I appreciate your ongoing commitment to your health goals. Please review the following plan we discussed and let me know if I can assist you in the future.   These are the goals we discussed:  Goals      Patient Stated     Increase activity & drink more water     Patient Stated     11/19/2022, current healthy lifestyle        This is a list of the screening recommended for you and due dates:  Health Maintenance  Topic Date Due   Zoster (Shingles) Vaccine (2 of 2) 11/27/2022   Flu Shot  01/31/2023   Eye exam for diabetics  02/12/2023   Medicare Annual Wellness Visit  11/19/2023   Colon Cancer Screening  11/15/2025   DTaP/Tdap/Td vaccine (5 - Td or Tdap) 01/09/2032   Pneumonia Vaccine  Completed   Hepatitis C Screening: USPSTF Recommendation to screen - Ages 49-79 yo.  Completed   HPV Vaccine  Aged Out   COVID-19 Vaccine  Discontinued    Advanced directives: Advance directive discussed with you today.   Conditions/risks identified: none  Next appointment: Follow up in one year for your annual wellness visit.   Preventive Care 55 Years and Older, Male  Preventive care refers to lifestyle choices and visits with your health care provider that can promote health and wellness. What does preventive care include? A yearly physical exam. This is also called an annual well check. Dental exams once or twice a year. Routine eye exams. Ask your health care provider how often you should have your eyes checked. Personal lifestyle choices, including: Daily care of your teeth and gums. Regular physical activity. Eating a healthy diet. Avoiding tobacco and drug use. Limiting alcohol use. Practicing safe sex. Taking low doses of aspirin every day. Taking vitamin and mineral supplements as recommended by your health care provider. What happens during an annual well check? The services and screenings done  by your health care provider during your annual well check will depend on your age, overall health, lifestyle risk factors, and family history of disease. Counseling  Your health care provider may ask you questions about your: Alcohol use. Tobacco use. Drug use. Emotional well-being. Home and relationship well-being. Sexual activity. Eating habits. History of falls. Memory and ability to understand (cognition). Work and work Astronomer. Screening  You may have the following tests or measurements: Height, weight, and BMI. Blood pressure. Lipid and cholesterol levels. These may be checked every 5 years, or more frequently if you are over 1 years old. Skin check. Lung cancer screening. You may have this screening every year starting at age 22 if you have a 30-pack-year history of smoking and currently smoke or have quit within the past 15 years. Fecal occult blood test (FOBT) of the stool. You may have this test every year starting at age 58. Flexible sigmoidoscopy or colonoscopy. You may have a sigmoidoscopy every 5 years or a colonoscopy every 10 years starting at age 29. Prostate cancer screening. Recommendations will vary depending on your family history and other risks. Hepatitis C blood test. Hepatitis B blood test. Sexually transmitted disease (STD) testing. Diabetes screening. This is done by checking your blood sugar (glucose) after you have not eaten for a while (fasting). You may have this done every 1-3 years. Abdominal aortic aneurysm (AAA) screening. You may need this if you are a current  or former smoker. Osteoporosis. You may be screened starting at age 41 if you are at high risk. Talk with your health care provider about your test results, treatment options, and if necessary, the need for more tests. Vaccines  Your health care provider may recommend certain vaccines, such as: Influenza vaccine. This is recommended every year. Tetanus, diphtheria, and acellular  pertussis (Tdap, Td) vaccine. You may need a Td booster every 10 years. Zoster vaccine. You may need this after age 66. Pneumococcal 13-valent conjugate (PCV13) vaccine. One dose is recommended after age 3. Pneumococcal polysaccharide (PPSV23) vaccine. One dose is recommended after age 28. Talk to your health care provider about which screenings and vaccines you need and how often you need them. This information is not intended to replace advice given to you by your health care provider. Make sure you discuss any questions you have with your health care provider. Document Released: 07/15/2015 Document Revised: 03/07/2016 Document Reviewed: 04/19/2015 Elsevier Interactive Patient Education  2017 [employer].  Fall Prevention in the Home Falls can cause injuries. They can happen to people of all ages. There are many things you can do to make your home safe and to help prevent falls. What can I do on the outside of my home? Regularly fix the edges of walkways and driveways and fix any cracks. Remove anything that might make you trip as you walk through a door, such as a raised step or threshold. Trim any bushes or trees on the path to your home. Use bright outdoor lighting. Clear any walking paths of anything that might make someone trip, such as rocks or tools. Regularly check to see if handrails are loose or broken. Make sure that both sides of any steps have handrails. Any raised decks and porches should have guardrails on the edges. Have any leaves, snow, or ice cleared regularly. Use sand or salt on walking paths during winter. Clean up any spills in your garage right away. This includes oil or grease spills. What can I do in the bathroom? Use night lights. Install grab bars by the toilet and in the tub and shower. Do not use towel bars as grab bars. Use non-skid mats or decals in the tub or shower. If you need to sit down in the shower, use a plastic, non-slip stool. Keep the floor  dry. Clean up any water that spills on the floor as soon as it happens. Remove soap buildup in the tub or shower regularly. Attach bath mats securely with double-sided non-slip rug tape. Do not have throw rugs and other things on the floor that can make you trip. What can I do in the bedroom? Use night lights. Make sure that you have a light by your bed that is easy to reach. Do not use any sheets or blankets that are too big for your bed. They should not hang down onto the floor. Have a firm chair that has side arms. You can use this for support while you get dressed. Do not have throw rugs and other things on the floor that can make you trip. What can I do in the kitchen? Clean up any spills right away. Avoid walking on wet floors. Keep items that you use a lot in easy-to-reach places. If you need to reach something above you, use a strong step stool that has a grab bar. Keep electrical cords out of the way. Do not use floor polish or wax that makes floors slippery. If you must use wax,  use non-skid floor wax. Do not have throw rugs and other things on the floor that can make you trip. What can I do with my stairs? Do not leave any items on the stairs. Make sure that there are handrails on both sides of the stairs and use them. Fix handrails that are broken or loose. Make sure that handrails are as long as the stairways. Check any carpeting to make sure that it is firmly attached to the stairs. Fix any carpet that is loose or worn. Avoid having throw rugs at the top or bottom of the stairs. If you do have throw rugs, attach them to the floor with carpet tape. Make sure that you have a light switch at the top of the stairs and the bottom of the stairs. If you do not have them, ask someone to add them for you. What else can I do to help prevent falls? Wear shoes that: Do not have high heels. Have rubber bottoms. Are comfortable and fit you well. Are closed at the toe. Do not wear  sandals. If you use a stepladder: Make sure that it is fully opened. Do not climb a closed stepladder. Make sure that both sides of the stepladder are locked into place. Ask someone to hold it for you, if possible. Clearly mark and make sure that you can see: Any grab bars or handrails. First and last steps. Where the edge of each step is. Use tools that help you move around (mobility aids) if they are needed. These include: Canes. Walkers. Scooters. Crutches. Turn on the lights when you go into a dark area. Replace any light bulbs as soon as they burn out. Set up your furniture so you have a clear path. Avoid moving your furniture around. If any of your floors are uneven, fix them. If there are any pets around you, be aware of where they are. Review your medicines with your doctor. Some medicines can make you feel dizzy. This can increase your chance of falling. Ask your doctor what other things that you can do to help prevent falls. This information is not intended to replace advice given to you by your health care provider. Make sure you discuss any questions you have with your health care provider. Document Released: 04/14/2009 Document Revised: 11/24/2015 Document Reviewed: 07/23/2014 Elsevier Interactive Patient Education  2017 [employer].

## 2022-11-19 NOTE — Progress Notes (Signed)
I connected with  Lawson Fiscal on 11/19/22 by a audio enabled telemedicine application and verified that I am speaking with the correct person using two identifiers.  Patient Location: Home  Provider Location: Office/Clinic  I discussed the limitations of evaluation and management by telemedicine. The patient expressed understanding and agreed to proceed.  Subjective:   Phillip Wall is a 72 y.o. male who presents for Medicare Annual/Subsequent preventive examination.  Review of Systems     Cardiac Risk Factors include: advanced age (>39men, >6 women);hypertension;male gender     Objective:    Today's Vitals   11/19/22 0842  Weight: 177 lb (80.3 kg)  Height: 6\' 1"  (1.854 m)   Body mass index is 23.35 kg/m.     11/19/2022    8:46 AM 06/26/2022    1:29 PM 05/05/2022    1:30 PM 12/15/2021    1:27 PM 11/15/2021    3:39 PM 10/19/2021   12:53 PM 09/13/2021    1:07 PM  Advanced Directives  Does Patient Have a Medical Advance Directive? No No No No No No No  Would patient like information on creating a medical advance directive?  No - Patient declined  No - Patient declined No - Patient declined  No - Patient declined    Current Medications (verified) Outpatient Encounter Medications as of 11/19/2022  Medication Sig   amLODipine (NORVASC) 10 MG tablet TAKE 1 TABLET(10 MG) BY MOUTH DAILY   ARTIFICIAL TEAR OP Place 1 drop into both eyes daily as needed (dry eyes).   docusate sodium (COLACE) 100 MG capsule Take 1 capsule (100 mg total) by mouth 2 (two) times daily.   famotidine (PEPCID) 40 MG tablet Take 1 tablet (40 mg total) by mouth daily as needed for indigestion.   rivaroxaban (XARELTO) 10 MG TABS tablet Take 1 tablet (10 mg total) by mouth daily.   telmisartan (MICARDIS) 20 MG tablet Take 1 tablet (20 mg total) by mouth daily.   tiZANidine (ZANAFLEX) 4 MG tablet Take 1 tablet (4 mg total) by mouth every 8 (eight) hours as needed for muscle spasms.   citalopram (CELEXA)  20 MG tablet Take 1 tablet (20 mg total) by mouth daily. (Patient not taking: Reported on 11/19/2022)   No facility-administered encounter medications on file as of 11/19/2022.    Allergies (verified) Patient has no known allergies.   History: Past Medical History:  Diagnosis Date   Anxiety    Depression    ED (erectile dysfunction)    GERD (gastroesophageal reflux disease)    History of colonic polyps    Hypertension    Past Surgical History:  Procedure Laterality Date   ROBOTIC ASSITED PARTIAL NEPHRECTOMY Right 10/30/2021   Procedure: XI ROBOTIC ASSITED  LAPAROSCOPIC PARTIAL NEPHRECTOMY;  Surgeon: Heloise Purpura, MD;  Location: [facility];  Service: Urology;  Laterality: Right;   TONSILECTOMY/ADENOIDECTOMY WITH MYRINGOTOMY     VASECTOMY  1986   Family History  Problem Relation Age of Onset   Bladder Cancer Mother    Heart disease Father    Heart attack Father    Breast cancer Paternal Grandmother    Breast cancer Daughter    Alcohol abuse Sister    Hypertension Sister    Cancer Sister    Drug abuse Sister    Social History   Socioeconomic History   Marital status: Married    Spouse name: Not on file   Number of children: 3   Years of education: Not on file  Highest education level: Not on file  Occupational History   Occupation: retired  Tobacco Use   Smoking status: Never   Smokeless tobacco: Never  Vaping Use   Vaping Use: Never used  Substance and Sexual Activity   Alcohol use: Not Currently   Drug use: Never   Sexual activity: Yes  Other Topics Concern   Not on file  Social History Narrative   Not on file   Social Determinants of Health   Financial Resource Strain: Low Risk  (11/19/2022)   Overall Financial Resource Strain (CARDIA)    Difficulty of Paying Living Expenses: Not hard at all  Food Insecurity: No Food Insecurity (11/19/2022)   Hunger Vital Sign    Worried About Running Out of Food in the Last Year: Never true    Ran Out of Food in the Last  Year: Never true  Transportation Needs: No Transportation Needs (11/19/2022)   PRAPARE - Administrator, Civil Service (Medical): No    Lack of Transportation (Non-Medical): No  Physical Activity: Insufficiently Active (11/19/2022)   Exercise Vital Sign    Days of Exercise per Week: 3 days    Minutes of Exercise per Session: 30 min  Stress: No Stress Concern Present (11/19/2022)   [employer] of Occupational Health - Occupational Stress Questionnaire    Feeling of Stress : Not at all  Social Connections: Moderately Integrated (11/15/2021)   Social Connection and Isolation Panel [NHANES]    Frequency of Communication with Friends and Family: Three times a week    Frequency of Social Gatherings with Friends and Family: Three times a week    Attends Religious Services: More than 4 times per year    Active Member of Clubs or Organizations: No    Attends Banker Meetings: Never    Marital Status: Married    Tobacco Counseling Counseling given: Not Answered   Clinical Intake:  Pre-visit preparation completed: Yes  Pain : No/denies pain     Nutritional Status: BMI of 19-24  Normal Nutritional Risks: None Diabetes: No  How often do you need to have someone help you when you read instructions, pamphlets, or other written materials from your doctor or pharmacy?: 1 - Never  Diabetic? no  Interpreter Needed?: No  Information entered by :: NAllen LPN   Activities of Daily Living    11/19/2022    8:47 AM  In your present state of health, do you have any difficulty performing the following activities:  Hearing? 0  Vision? 0  Difficulty concentrating or making decisions? 0  Walking or climbing stairs? 0  Dressing or bathing? 0  Doing errands, shopping? 0  Preparing Food and eating ? N  Using the Toilet? N  In the past six months, have you accidently leaked urine? N  Do you have problems with loss of bowel control? N  Managing your Medications? N   Managing your Finances? N  Housekeeping or managing your Housekeeping? N    Patient Care Team: Mliss Sax, MD as PCP - General (Family Medicine)  Indicate any recent Medical Services you may have received from other than [facility] providers in the past year (date may be approximate).     Assessment:   This is a routine wellness examination for [facility].  Hearing/Vision screen Vision Screening - Comments:: Regular eye exams, [facility]  Dietary issues and exercise activities discussed: Current Exercise Habits: Home exercise routine, Type of exercise: walking, Time (Minutes): 30, Frequency (Times/Week): 3,  Weekly Exercise (Minutes/Week): 90   Goals Addressed             This Visit's Progress    Patient Stated       11/19/2022, current healthy lifestyle       Depression Screen    11/19/2022    8:47 AM 10/22/2022    2:36 PM 09/17/2022   10:25 AM 07/27/2022    1:59 PM 07/23/2022   10:48 AM 04/24/2022   10:33 AM 01/19/2022    9:18 AM  PHQ 2/9 Scores  PHQ - 2 Score 0 0 1 0 0 0 0    Fall Risk    11/19/2022    8:47 AM 10/22/2022    2:36 PM 09/17/2022   10:25 AM 07/27/2022    1:59 PM 07/23/2022   10:48 AM  Fall Risk   Falls in the past year? 0 0 0  0  Number falls in past yr: 0 0 0 0 0  Injury with Fall? 0 0 0 0 0  Risk for fall due to : Medication side effect No Fall Risks No Fall Risks No Fall Risks No Fall Risks  Follow up Falls prevention discussed;Education provided;Falls evaluation completed Falls evaluation completed Falls evaluation completed Falls evaluation completed Falls evaluation completed    FALL RISK PREVENTION PERTAINING TO THE HOME:  Any stairs in or around the home? Yes  If so, are there any without handrails? No  Home free of loose throw rugs in walkways, pet beds, electrical cords, etc? Yes  Adequate lighting in your home to reduce risk of falls? Yes   ASSISTIVE DEVICES UTILIZED TO PREVENT FALLS:  Life alert? No  Use of a cane, walker  or w/c? No  Grab bars in the bathroom? Yes  Shower chair or bench in shower? Yes  Elevated toilet seat or a handicapped toilet? Yes   TIMED UP AND GO:  Was the test performed? No .      Cognitive Function:        11/19/2022    8:48 AM  6CIT Screen  What Year? 0 points  What month? 0 points  What time? 0 points  Count back from 20 0 points  Months in reverse 0 points  Repeat phrase 0 points  Total Score 0 points    Immunizations Immunization History  Administered Date(s) Administered   Fluad Quad(high Dose 65+) 03/12/2019, 06/07/2020   Influenza, High Dose Seasonal PF 07/10/2021   Influenza,inj,Quad PF,6+ Mos 05/02/2018   Influenza-Unspecified 04/01/2016   PFIZER(Purple Top)SARS-COV-2 Vaccination 09/21/2019, 10/15/2019, 04/11/2020   PNEUMOCOCCAL CONJUGATE-20 01/08/2022   Pneumococcal Polysaccharide-23 12/10/2019   Td 07/29/2009   Td (Adult),5 Lf Tetanus Toxid, Preservative Free 07/29/2009   Tdap 07/03/2011, 01/08/2022    TDAP status: Up to date  Flu Vaccine status: Up to date  Pneumococcal vaccine status: Up to date  Covid-19 vaccine status: Completed vaccines  Qualifies for Shingles Vaccine? Yes   Zostavax completed No   Shingrix Completed?: needs second dose  Screening Tests Health Maintenance  Topic Date Due   Medicare Annual Wellness (AWV)  11/16/2022   Zoster Vaccines- Shingrix (2 of 2) 11/27/2022   INFLUENZA VACCINE  01/31/2023   OPHTHALMOLOGY EXAM  02/12/2023   COLONOSCOPY (Pts 45-40yrs Insurance coverage will need to be confirmed)  11/15/2025   DTaP/Tdap/Td (5 - Td or Tdap) 01/09/2032   Pneumonia Vaccine 56+ Years old  Completed   Hepatitis C Screening  Completed   HPV VACCINES  Aged Out   COVID-19 Vaccine  Discontinued    Health Maintenance  Health Maintenance Due  Topic Date Due   Medicare Annual Wellness (AWV)  11/16/2022    Colorectal cancer screening: Type of screening: Colonoscopy. Completed 11/16/2015. Repeat every 10  years  Lung Cancer Screening: (Low Dose CT Chest recommended if Age 38-80 years, 30 pack-year currently smoking OR have quit w/in 15years.) does not qualify.   Lung Cancer Screening Referral: no  Additional Screening:  Hepatitis C Screening: does qualify; Completed 10/30/2016  Vision Screening: Recommended annual ophthalmology exams for early detection of glaucoma and other disorders of the eye. Is the patient up to date with their annual eye exam?  Yes  Who is the provider or what is the name of the office in which the patient attends annual eye exams? [facility] If pt is not established with a provider, would they like to be referred to a provider to establish care? No .   Dental Screening: Recommended annual dental exams for proper oral hygiene  Community Resource Referral / Chronic Care Management: CRR required this visit?  No   CCM required this visit?  No      Plan:     I have personally reviewed and noted the following in the patient's chart:   Medical and social history Use of alcohol, tobacco or illicit drugs  Current medications and supplements including opioid prescriptions. Patient is not currently taking opioid prescriptions. Functional ability and status Nutritional status Physical activity Advanced directives List of other physicians Hospitalizations, surgeries, and ER visits in previous 12 months Vitals Screenings to include cognitive, depression, and falls Referrals and appointments  In addition, I have reviewed and discussed with patient certain preventive protocols, quality metrics, and best practice recommendations. A written personalized care plan for preventive services as well as general preventive health recommendations were provided to patient.     Barb Merino, LPN   1/61/0960   Nurse Notes: none  Due to this being a virtual visit, the after visit summary with patients personalized plan was offered to patient via mail or my-chart.  Patient would like to access on my-chart

## 2022-11-29 ENCOUNTER — Encounter: Payer: Self-pay | Admitting: Family Medicine

## 2022-12-10 ENCOUNTER — Encounter: Payer: Self-pay | Admitting: Family

## 2022-12-26 ENCOUNTER — Inpatient Hospital Stay: Payer: Medicare Other | Attending: Hematology & Oncology

## 2022-12-26 DIAGNOSIS — R760 Raised antibody titer: Secondary | ICD-10-CM

## 2022-12-26 DIAGNOSIS — D6859 Other primary thrombophilia: Secondary | ICD-10-CM

## 2022-12-26 DIAGNOSIS — Z86711 Personal history of pulmonary embolism: Secondary | ICD-10-CM | POA: Diagnosis not present

## 2022-12-26 DIAGNOSIS — C641 Malignant neoplasm of right kidney, except renal pelvis: Secondary | ICD-10-CM | POA: Insufficient documentation

## 2022-12-26 DIAGNOSIS — I2694 Multiple subsegmental pulmonary emboli without acute cor pulmonale: Secondary | ICD-10-CM

## 2022-12-26 LAB — CBC WITH DIFFERENTIAL (CANCER CENTER ONLY)
Abs Immature Granulocytes: 0.02 10*3/uL (ref 0.00–0.07)
Basophils Absolute: 0 10*3/uL (ref 0.0–0.1)
Basophils Relative: 0 %
Eosinophils Absolute: 0.1 10*3/uL (ref 0.0–0.5)
Eosinophils Relative: 1 %
HCT: 35.9 % — ABNORMAL LOW (ref 39.0–52.0)
Hemoglobin: 12.2 g/dL — ABNORMAL LOW (ref 13.0–17.0)
Immature Granulocytes: 0 %
Lymphocytes Relative: 28 %
Lymphs Abs: 1.6 10*3/uL (ref 0.7–4.0)
MCH: 28.9 pg (ref 26.0–34.0)
MCHC: 34 g/dL (ref 30.0–36.0)
MCV: 85.1 fL (ref 80.0–100.0)
Monocytes Absolute: 0.5 10*3/uL (ref 0.1–1.0)
Monocytes Relative: 9 %
Neutro Abs: 3.4 10*3/uL (ref 1.7–7.7)
Neutrophils Relative %: 62 %
Platelet Count: 216 10*3/uL (ref 150–400)
RBC: 4.22 MIL/uL (ref 4.22–5.81)
RDW: 13.8 % (ref 11.5–15.5)
WBC Count: 5.6 10*3/uL (ref 4.0–10.5)
nRBC: 0 % (ref 0.0–0.2)

## 2022-12-26 LAB — LACTATE DEHYDROGENASE: LDH: 123 U/L (ref 98–192)

## 2022-12-26 LAB — CMP (CANCER CENTER ONLY)
ALT: 10 U/L (ref 0–44)
AST: 13 U/L — ABNORMAL LOW (ref 15–41)
Albumin: 4.3 g/dL (ref 3.5–5.0)
Alkaline Phosphatase: 62 U/L (ref 38–126)
Anion gap: 6 (ref 5–15)
BUN: 14 mg/dL (ref 8–23)
CO2: 28 mmol/L (ref 22–32)
Calcium: 9.7 mg/dL (ref 8.9–10.3)
Chloride: 107 mmol/L (ref 98–111)
Creatinine: 1.35 mg/dL — ABNORMAL HIGH (ref 0.61–1.24)
GFR, Estimated: 56 mL/min — ABNORMAL LOW (ref >60)
Glucose, Bld: 96 mg/dL (ref 70–99)
Potassium: 4 mmol/L (ref 3.5–5.1)
Sodium: 141 mmol/L (ref 135–145)
Total Bilirubin: 0.7 mg/dL (ref 0.3–1.2)
Total Protein: 6.9 g/dL (ref 6.5–8.1)

## 2022-12-27 LAB — LUPUS ANTICOAGULANT PANEL
DRVVT: 45.4 s (ref 0.0–47.0)
PTT Lupus Anticoagulant: 37 s (ref 0.0–43.5)

## 2023-01-21 ENCOUNTER — Encounter: Payer: Medicare Other | Admitting: Family Medicine

## 2023-01-30 ENCOUNTER — Encounter (INDEPENDENT_AMBULATORY_CARE_PROVIDER_SITE_OTHER): Payer: Self-pay

## 2023-02-12 ENCOUNTER — Encounter: Payer: Self-pay | Admitting: Family Medicine

## 2023-02-12 ENCOUNTER — Ambulatory Visit (INDEPENDENT_AMBULATORY_CARE_PROVIDER_SITE_OTHER): Payer: Medicare Other | Admitting: Family Medicine

## 2023-02-12 VITALS — BP 138/70 | HR 62 | Temp 98.2°F | Ht 73.0 in | Wt 177.0 lb

## 2023-02-12 DIAGNOSIS — Z0000 Encounter for general adult medical examination without abnormal findings: Secondary | ICD-10-CM

## 2023-02-12 DIAGNOSIS — Z86711 Personal history of pulmonary embolism: Secondary | ICD-10-CM

## 2023-02-12 DIAGNOSIS — K219 Gastro-esophageal reflux disease without esophagitis: Secondary | ICD-10-CM

## 2023-02-12 DIAGNOSIS — Z125 Encounter for screening for malignant neoplasm of prostate: Secondary | ICD-10-CM

## 2023-02-12 DIAGNOSIS — I10 Essential (primary) hypertension: Secondary | ICD-10-CM

## 2023-02-12 DIAGNOSIS — R5383 Other fatigue: Secondary | ICD-10-CM | POA: Diagnosis not present

## 2023-02-12 DIAGNOSIS — F39 Unspecified mood [affective] disorder: Secondary | ICD-10-CM

## 2023-02-12 DIAGNOSIS — Z13220 Encounter for screening for lipoid disorders: Secondary | ICD-10-CM

## 2023-02-12 LAB — LIPID PANEL
Cholesterol: 160 mg/dL (ref 0–200)
HDL: 55.9 mg/dL (ref >39.00)
LDL Cholesterol: 94 mg/dL (ref 0–99)
NonHDL: 104.27
Total CHOL/HDL Ratio: 3
Triglycerides: 52 mg/dL (ref 0.0–149.0)
VLDL: 10.4 mg/dL (ref 0.0–40.0)

## 2023-02-12 LAB — PSA: PSA: 0.69 ng/mL (ref 0.10–4.00)

## 2023-02-12 LAB — TSH: TSH: 3.05 u[IU]/mL (ref 0.35–5.50)

## 2023-02-12 MED ORDER — FAMOTIDINE 40 MG PO TABS: 40.0000 mg | ORAL_TABLET | Freq: Every day | ORAL | 1 refills | Status: AC | PRN

## 2023-02-12 MED ORDER — CITALOPRAM HYDROBROMIDE 10 MG PO TABS
10.0000 mg | ORAL_TABLET | Freq: Every evening | ORAL | 3 refills | Status: AC
Start: 2023-02-12 — End: ?

## 2023-02-12 NOTE — Progress Notes (Signed)
Established Patient Office Visit   Subjective:  Patient ID: Phillip Wall, male    DOB: 17-Mar-1951  Age: 72 y.o. MRN: [id number]  Chief Complaint  Patient presents with   Annual Exam    CPE Pt is fasting. Has concerns of moles on his back.     HPI Encounter Diagnoses  Name Primary?   Healthcare maintenance Yes   Mood disorder (HCC)    Gastroesophageal reflux disease    Essential hypertension    History of pulmonary embolism    Other fatigue    Screening for hyperlipidemia    Screening for prostate cancer    Gastroesophageal reflux disease, unspecified whether esophagitis present    For physical exam and follow-up of above.  Doing well and staying active by walking for 30 minutes daily.  He does have regular dental care.  Continues amlodipine and Micardis for well-controlled blood pressure.  Lipid profile is excellent.  His wife has noted irritability.  He had discontinued psych telegram secondary to drowsiness.  He sees dermatology regularly for moles.  Hematology has recommended that he is continue anticoagulation for another year.  Famotidine works well for his GERD symptoms.   Review of Systems  Constitutional: Negative.   HENT: Negative.    Eyes:  Negative for blurred vision, discharge and redness.  Respiratory: Negative.    Cardiovascular: Negative.   Gastrointestinal:  Negative for abdominal pain and blood in stool.  Genitourinary: Negative.  Negative for dysuria, frequency, hematuria and urgency.  Musculoskeletal: Negative.  Negative for myalgias.  Skin:  Negative for rash.  Neurological:  Negative for tingling, loss of consciousness and weakness.  Endo/Heme/Allergies:  Negative for polydipsia.     Current Outpatient Medications:    amLODipine (NORVASC) 10 MG tablet, TAKE 1 TABLET(10 MG) BY MOUTH DAILY, Disp: 90 tablet, Rfl: 1   ARTIFICIAL TEAR OP, Place 1 drop into both eyes daily as needed (dry eyes)., Disp: , Rfl:    citalopram (CELEXA) 10 MG tablet, Take 1  tablet (10 mg total) by mouth at bedtime., Disp: 30 tablet, Rfl: 3   docusate sodium (COLACE) 100 MG capsule, Take 1 capsule (100 mg total) by mouth 2 (two) times daily., Disp: , Rfl:    rivaroxaban (XARELTO) 10 MG TABS tablet, Take 1 tablet (10 mg total) by mouth daily., Disp: 90 tablet, Rfl: 3   telmisartan (MICARDIS) 20 MG tablet, Take 1 tablet (20 mg total) by mouth daily., Disp: 90 tablet, Rfl: 1   tiZANidine (ZANAFLEX) 4 MG tablet, Take 1 tablet (4 mg total) by mouth every 8 (eight) hours as needed for muscle spasms., Disp: 30 tablet, Rfl: 0   famotidine (PEPCID) 40 MG tablet, Take 1 tablet (40 mg total) by mouth daily as needed for indigestion., Disp: 90 tablet, Rfl: 1   Objective:     BP 138/70   Pulse 62   Temp 98.2 F (36.8 C)   Ht 6\' 1"  (1.854 m)   Wt 177 lb (80.3 kg)   SpO2 99%   BMI 23.35 kg/m    Physical Exam Constitutional:      General: He is not in acute distress.    Appearance: Normal appearance. He is not ill-appearing, toxic-appearing or diaphoretic.  HENT:     Head: Normocephalic and atraumatic.     Right Ear: Tympanic membrane, ear canal and external ear normal.     Left Ear: Tympanic membrane, ear canal and external ear normal.     Mouth/Throat:     Mouth:  Mucous membranes are moist.     Pharynx: Oropharynx is clear. No oropharyngeal exudate or posterior oropharyngeal erythema.  Eyes:     General: No scleral icterus.       Right eye: No discharge.        Left eye: No discharge.     Extraocular Movements: Extraocular movements intact.     Conjunctiva/sclera: Conjunctivae normal.     Pupils: Pupils are equal, round, and reactive to light.  Cardiovascular:     Rate and Rhythm: Normal rate and regular rhythm.  Pulmonary:     Effort: Pulmonary effort is normal. No respiratory distress.     Breath sounds: Normal breath sounds. No wheezing, rhonchi or rales.  Abdominal:     General: Bowel sounds are normal.     Tenderness: There is no abdominal  tenderness. There is no guarding or rebound.  Musculoskeletal:     Cervical back: No rigidity or tenderness.  Skin:    General: Skin is warm and dry.  Neurological:     Mental Status: He is alert and oriented to person, place, and time.  Psychiatric:        Mood and Affect: Mood normal.        Behavior: Behavior normal.      No results found for any visits on 02/12/23.    The 10-year ASCVD risk score (Arnett DK, et al., 2019) is: 20.8%    Assessment & Plan:   Healthcare maintenance  Mood disorder (HCC) -     TSH -     Citalopram Hydrobromide; Take 1 tablet (10 mg total) by mouth at bedtime.  Dispense: 30 tablet; Refill: 3  Gastroesophageal reflux disease  Essential hypertension  History of pulmonary embolism  Other fatigue -     TSH  Screening for hyperlipidemia -     Lipid panel  Screening for prostate cancer -     PSA  Gastroesophageal reflux disease, unspecified whether esophagitis present -     Famotidine; Take 1 tablet (40 mg total) by mouth daily as needed for indigestion.  Dispense: 90 tablet; Refill: 1    Return in about 8 weeks (around 04/09/2023).  Restart the citalopram at the lower dose of 10 mg and he will take it at night.  Follow-up in 8 weeks to let me know how things are going.  Could increase to the 20 mg dose.  His lipid profile actually quite good.  His ASCVD is elevated mostly due to his age.  Would like to wait for the new formula before considering a statin.  Discussed this with him and he agrees.  Continue active and healthy lifestyle.  Continue follow-up with hematology.  Continue all medications as indicated above above.  Information given on health maintenance and disease prevention.  Mliss Sax, MD

## 2023-04-08 ENCOUNTER — Ambulatory Visit: Payer: Medicare Other | Admitting: Family Medicine

## 2023-04-20 ENCOUNTER — Other Ambulatory Visit: Payer: Self-pay | Admitting: Family Medicine

## 2023-04-20 DIAGNOSIS — I10 Essential (primary) hypertension: Secondary | ICD-10-CM

## 2023-04-23 ENCOUNTER — Other Ambulatory Visit: Payer: Self-pay

## 2023-04-23 DIAGNOSIS — I10 Essential (primary) hypertension: Secondary | ICD-10-CM

## 2023-04-23 MED ORDER — TELMISARTAN 20 MG PO TABS
20.0000 mg | ORAL_TABLET | Freq: Every day | ORAL | 1 refills | Status: DC
Start: 2023-04-23 — End: 2023-10-14

## 2023-06-27 ENCOUNTER — Other Ambulatory Visit: Payer: Self-pay | Admitting: *Deleted

## 2023-06-27 DIAGNOSIS — D6859 Other primary thrombophilia: Secondary | ICD-10-CM

## 2023-06-27 DIAGNOSIS — C641 Malignant neoplasm of right kidney, except renal pelvis: Secondary | ICD-10-CM

## 2023-06-28 ENCOUNTER — Inpatient Hospital Stay: Payer: Medicare Other

## 2023-06-28 ENCOUNTER — Inpatient Hospital Stay: Payer: Medicare Other | Attending: Family | Admitting: Family

## 2023-06-28 VITALS — BP 160/52 | HR 85 | Temp 97.7°F | Resp 16 | Ht 73.0 in | Wt 177.0 lb

## 2023-06-28 DIAGNOSIS — D6862 Lupus anticoagulant syndrome: Secondary | ICD-10-CM | POA: Diagnosis not present

## 2023-06-28 DIAGNOSIS — I2694 Multiple subsegmental pulmonary emboli without acute cor pulmonale: Secondary | ICD-10-CM | POA: Diagnosis not present

## 2023-06-28 DIAGNOSIS — R760 Raised antibody titer: Secondary | ICD-10-CM

## 2023-06-28 DIAGNOSIS — Z86711 Personal history of pulmonary embolism: Secondary | ICD-10-CM | POA: Diagnosis not present

## 2023-06-28 DIAGNOSIS — D6859 Other primary thrombophilia: Secondary | ICD-10-CM

## 2023-06-28 DIAGNOSIS — C641 Malignant neoplasm of right kidney, except renal pelvis: Secondary | ICD-10-CM

## 2023-06-28 DIAGNOSIS — Z7901 Long term (current) use of anticoagulants: Secondary | ICD-10-CM | POA: Diagnosis not present

## 2023-06-28 LAB — CMP (CANCER CENTER ONLY)
ALT: 12 U/L (ref 0–44)
AST: 16 U/L (ref 15–41)
Albumin: 4.5 g/dL (ref 3.5–5.0)
Alkaline Phosphatase: 60 U/L (ref 38–126)
Anion gap: 8 (ref 5–15)
BUN: 16 mg/dL (ref 8–23)
CO2: 26 mmol/L (ref 22–32)
Calcium: 9.4 mg/dL (ref 8.9–10.3)
Chloride: 104 mmol/L (ref 98–111)
Creatinine: 1.38 mg/dL — ABNORMAL HIGH (ref 0.61–1.24)
GFR, Estimated: 54 mL/min — ABNORMAL LOW (ref >60)
Glucose, Bld: 90 mg/dL (ref 70–99)
Potassium: 4.2 mmol/L (ref 3.5–5.1)
Sodium: 138 mmol/L (ref 135–145)
Total Bilirubin: 0.8 mg/dL (ref <1.2)
Total Protein: 7.3 g/dL (ref 6.5–8.1)

## 2023-06-28 LAB — CBC WITH DIFFERENTIAL (CANCER CENTER ONLY)
Abs Immature Granulocytes: 0.02 10*3/uL (ref 0.00–0.07)
Basophils Absolute: 0 10*3/uL (ref 0.0–0.1)
Basophils Relative: 0 %
Eosinophils Absolute: 0.1 10*3/uL (ref 0.0–0.5)
Eosinophils Relative: 1 %
HCT: 38 % — ABNORMAL LOW (ref 39.0–52.0)
Hemoglobin: 13.1 g/dL (ref 13.0–17.0)
Immature Granulocytes: 0 %
Lymphocytes Relative: 29 %
Lymphs Abs: 1.7 10*3/uL (ref 0.7–4.0)
MCH: 29.1 pg (ref 26.0–34.0)
MCHC: 34.5 g/dL (ref 30.0–36.0)
MCV: 84.4 fL (ref 80.0–100.0)
Monocytes Absolute: 0.6 10*3/uL (ref 0.1–1.0)
Monocytes Relative: 11 %
Neutro Abs: 3.5 10*3/uL (ref 1.7–7.7)
Neutrophils Relative %: 59 %
Platelet Count: 227 10*3/uL (ref 150–400)
RBC: 4.5 MIL/uL (ref 4.22–5.81)
RDW: 13.8 % (ref 11.5–15.5)
WBC Count: 6 10*3/uL (ref 4.0–10.5)
nRBC: 0 % (ref 0.0–0.2)

## 2023-06-28 LAB — LACTATE DEHYDROGENASE: LDH: 136 U/L (ref 98–192)

## 2023-06-28 NOTE — Progress Notes (Signed)
Hematology and Oncology Follow Up Visit  Phillip Wall [id number] April 04, 1951 72 y.o. 06/28/2023   Principle Diagnosis:  Bilateral subsegmental pulmonary emboli Positive lupus anticoagulant   Current Therapy:        Xarelto 10 mg PO daily    Interim History:  Phillip Wall is here today for follow-up. He is doing well and has no complaints.  He is tolerating Xarelto 10 mg PO daily. No blood loss, bruising or petechiae.  No swelling, tenderness, numbness or tingling in his extremities.  No falls or syncope.  No fever, chills, n/v, cough, rash, dizziness, SOB, chest pain, palpitations, abdominal pain or changes in bowel or bladder habits.  Appetite and hydration are good. Weight is stable at 177 lbs.   ECOG Performance Status: 1 - Symptomatic but completely ambulatory  Medications:  Allergies as of 06/28/2023   No Known Allergies      Medication List        Accurate as of June 28, 2023  1:50 PM. If you have any questions, ask your nurse or doctor.          amLODipine 10 MG tablet Commonly known as: NORVASC TAKE 1 TABLET(10 MG) BY MOUTH DAILY   ARTIFICIAL TEAR OP Place 1 drop into both eyes daily as needed (dry eyes).   citalopram 10 MG tablet Commonly known as: CELEXA Take 1 tablet (10 mg total) by mouth at bedtime.   docusate sodium 100 MG capsule Commonly known as: COLACE Take 1 capsule (100 mg total) by mouth 2 (two) times daily.   famotidine 40 MG tablet Commonly known as: PEPCID Take 1 tablet (40 mg total) by mouth daily as needed for indigestion.   rivaroxaban 10 MG Tabs tablet Commonly known as: XARELTO Take 1 tablet (10 mg total) by mouth daily.   telmisartan 20 MG tablet Commonly known as: Micardis Take 1 tablet (20 mg total) by mouth daily.   tiZANidine 4 MG tablet Commonly known as: Zanaflex Take 1 tablet (4 mg total) by mouth every 8 (eight) hours as needed for muscle spasms.        Allergies: No Known Allergies  Past Medical  History, Surgical history, Social history, and Family History were reviewed and updated.  Review of Systems: All other 10 point review of systems is negative.   Physical Exam:  height is 6\' 1"  (1.854 m) and weight is 177 lb (80.3 kg). His oral temperature is 97.7 F (36.5 C). His blood pressure is 160/52 (abnormal) and his pulse is 85. His respiration is 16 and oxygen saturation is 100%.   Wt Readings from Last 3 Encounters:  06/28/23 177 lb (80.3 kg)  02/12/23 177 lb (80.3 kg)  11/19/22 177 lb (80.3 kg)    Ocular: Sclerae unicteric, pupils equal, round and reactive to light Ear-nose-throat: Oropharynx clear, dentition fair Lymphatic: No cervical or supraclavicular adenopathy Lungs no rales or rhonchi, good excursion bilaterally Heart regular rate and rhythm, no murmur appreciated Abd soft, nontender, positive bowel sounds MSK no focal spinal tenderness, no joint edema Neuro: non-focal, well-oriented, appropriate affect Breasts: Deferred   Lab Results  Component Value Date   WBC 6.0 06/28/2023   HGB 13.1 06/28/2023   HCT 38.0 (L) 06/28/2023   MCV 84.4 06/28/2023   PLT 227 06/28/2023   No results found for: "FERRITIN", "IRON", "TIBC", "UIBC", "IRONPCTSAT" Lab Results  Component Value Date   RBC 4.50 06/28/2023   No results found for: "KPAFRELGTCHN", "LAMBDASER", "KAPLAMBRATIO" No results found for: "IGGSERUM", "IGA", "IGMSERUM"  Lab Results  Component Value Date   ALBUMINELP 4.8 05/10/2021   A1GS 0.3 05/10/2021   A2GS 0.8 05/10/2021   BETS 0.4 05/10/2021   BETA2SER 0.3 05/10/2021   GAMS 1.4 05/10/2021   SPEI  05/10/2021     Comment:     Normal Serum Protein Electrophoresis Pattern. No abnormal protein bands (M-protein) detected.      Chemistry      Component Value Date/Time   NA 141 12/26/2022 1213   NA 140 10/27/2016 0000   K 4.0 12/26/2022 1213   CL 107 12/26/2022 1213   CO2 28 12/26/2022 1213   BUN 14 12/26/2022 1213   BUN 14 10/27/2016 0000    CREATININE 1.35 (H) 12/26/2022 1213   GLU 81 10/27/2016 0000      Component Value Date/Time   CALCIUM 9.7 12/26/2022 1213   ALKPHOS 62 12/26/2022 1213   AST 13 (L) 12/26/2022 1213   ALT 10 12/26/2022 1213   BILITOT 0.7 12/26/2022 1213       Impression and Plan:  Phillip Wall is a very pleasant 72 yo African American gentleman with new diagnosis of bilateral pulmonary emboli in August. He had positive lupus anticoagulant.  He will continue his same regimen with maintenance Xarelto 10 mg PO daily.  Lab only in 6 months, follow-up in 1 year.    Eileen Stanford, NP 12/27/20241:50 PM

## 2023-07-02 ENCOUNTER — Encounter: Payer: Self-pay | Admitting: Family Medicine

## 2023-07-04 ENCOUNTER — Telehealth: Payer: Self-pay

## 2023-07-04 NOTE — Telephone Encounter (Signed)
 Copied from CRM [id number]. Topic: Clinical - Prescription Issue >> Jul 04, 2023  8:59 AM Rosina BIRCH wrote: Reason for CRM: [facility] called stating [facility] to verify that he is taking them. Pt wife want to know if they can print out what was sent through mychart or pick up a hard copy of it at the office.

## 2023-07-04 NOTE — Telephone Encounter (Signed)
 Pt picked up med list.

## 2023-07-04 NOTE — Telephone Encounter (Signed)
 Spoke with [place] and verified dob. She verified she just needs patient's med list printed for [place]. I advised her I will place a copy up front for pick up.

## 2023-07-05 ENCOUNTER — Encounter: Payer: Self-pay | Admitting: Family

## 2023-08-16 DIAGNOSIS — H524 Presbyopia: Secondary | ICD-10-CM | POA: Diagnosis not present

## 2023-08-16 DIAGNOSIS — H40013 Open angle with borderline findings, low risk, bilateral: Secondary | ICD-10-CM | POA: Diagnosis not present

## 2023-08-16 DIAGNOSIS — H04123 Dry eye syndrome of bilateral lacrimal glands: Secondary | ICD-10-CM | POA: Diagnosis not present

## 2023-08-16 DIAGNOSIS — H25813 Combined forms of age-related cataract, bilateral: Secondary | ICD-10-CM | POA: Diagnosis not present

## 2023-09-02 ENCOUNTER — Telehealth: Payer: Self-pay | Admitting: Family

## 2023-09-02 ENCOUNTER — Other Ambulatory Visit: Payer: Self-pay | Admitting: Family

## 2023-09-02 NOTE — Telephone Encounter (Signed)
 Left call back number to discuss stopping Xarelto and starting 2 baby aspirin daily.

## 2023-09-03 ENCOUNTER — Telehealth: Payer: Self-pay | Admitting: Family

## 2023-09-03 NOTE — Telephone Encounter (Signed)
 I was able to speak with the patient and his wife and let them know he could stop his Xarelto and start taking 2 baby aspirin a day with food. He took his last Xarelto yesterday and will start taking the 2 baby aspirin with food this evening. No questions or concerns at this time. They are appreciative of the call. We will see him at scheduled follow-up.

## 2023-10-03 ENCOUNTER — Other Ambulatory Visit: Payer: Self-pay | Admitting: Family Medicine

## 2023-10-03 DIAGNOSIS — I10 Essential (primary) hypertension: Secondary | ICD-10-CM

## 2023-10-09 ENCOUNTER — Other Ambulatory Visit: Payer: Self-pay | Admitting: Family Medicine

## 2023-10-09 DIAGNOSIS — I10 Essential (primary) hypertension: Secondary | ICD-10-CM

## 2023-10-09 NOTE — Telephone Encounter (Signed)
 Routed to wrong office

## 2023-10-09 NOTE — Telephone Encounter (Signed)
 Copied from CRM [id number]. Topic: Clinical - Medication Refill >> Oct 09, 2023 10:53 AM Arley Phenix D wrote: Most Recent Primary Care Visit:  Provider: Mliss Sax  Department: [facility]  Visit Type: PHYSICAL  Date: 02/12/2023  Medication: telmisartan (MICARDIS) 20 MG tablet, amLODipine (NORVASC) 10 MG tablet   Has the patient contacted their pharmacy? Yes (Agent: If no, request that the patient contact the pharmacy for the refill. If patient does not wish to contact the pharmacy document the reason why and proceed with request.) (Agent: If yes, when and what did the pharmacy advise?) advised to contact PCP  Is this the correct pharmacy for this prescription? Yes If no, delete pharmacy and type the correct one.  This is the patient's preferred pharmacy:  [id number] [place], [place] - [address] AT [address] & [address] [place] [address] Phone: [phone] Fax: [phone]   Has the prescription been filled recently? No  Is the patient out of the medication? No  Has the patient been seen for an appointment in the last year OR does the patient have an upcoming appointment? Yes  Can we respond through MyChart? Yes  Agent: Please be advised that Rx refills may take up to 3 business days. We ask that you follow-up with your pharmacy.

## 2023-10-14 ENCOUNTER — Other Ambulatory Visit: Payer: Self-pay | Admitting: Family Medicine

## 2023-10-14 ENCOUNTER — Other Ambulatory Visit: Payer: Self-pay

## 2023-10-14 ENCOUNTER — Encounter: Payer: Self-pay | Admitting: Family Medicine

## 2023-10-14 ENCOUNTER — Telehealth: Payer: Self-pay | Admitting: Family Medicine

## 2023-10-14 DIAGNOSIS — I10 Essential (primary) hypertension: Secondary | ICD-10-CM

## 2023-10-14 MED ORDER — TELMISARTAN 20 MG PO TABS: 20.0000 mg | ORAL_TABLET | Freq: Every day | ORAL | 0 refills | Status: DC

## 2023-10-14 MED ORDER — AMLODIPINE BESYLATE 10 MG PO TABS: ORAL_TABLET | ORAL | 0 refills | Status: DC

## 2023-10-14 NOTE — Telephone Encounter (Signed)
 Prescription Request  10/14/2023  LOV: 02/12/2023  What is the name of the medication or equipment? telmisartan (MICARDIS) 20 MG tablet [[id number]]  and amLODipine (NORVASC) 10 MG tablet [[id number]]   Have you contacted your pharmacy to request a refill? No   Which pharmacy would you like this sent to?  [id number] - [place], [place] - [address] AT [address] [facility] [address] & [address] [place] [address] Phone: [phone] Fax: [phone]     Patient notified that their request is being sent to the clinical staff for review and that they should receive a response within 2 business days.   Please advise at Mobile [phone] (mobile)

## 2023-10-14 NOTE — Telephone Encounter (Signed)
 Pt scheduled first available 10/31/23. He has about one week left for this script.

## 2023-10-23 ENCOUNTER — Encounter: Payer: Self-pay | Admitting: Nurse Practitioner

## 2023-10-23 ENCOUNTER — Ambulatory Visit (INDEPENDENT_AMBULATORY_CARE_PROVIDER_SITE_OTHER): Admitting: Nurse Practitioner

## 2023-10-23 ENCOUNTER — Other Ambulatory Visit: Payer: Self-pay | Admitting: Nurse Practitioner

## 2023-10-23 VITALS — BP 136/64 | HR 69 | Temp 98.0°F | Ht 73.0 in | Wt 171.4 lb

## 2023-10-23 DIAGNOSIS — H6690 Otitis media, unspecified, unspecified ear: Secondary | ICD-10-CM | POA: Diagnosis not present

## 2023-10-23 DIAGNOSIS — J069 Acute upper respiratory infection, unspecified: Secondary | ICD-10-CM | POA: Diagnosis not present

## 2023-10-23 MED ORDER — FLUTICASONE PROPIONATE 50 MCG/ACT NA SUSP: 2.0000 | Freq: Every day | NASAL | 0 refills | Status: DC

## 2023-10-23 MED ORDER — AMOXICILLIN-POT CLAVULANATE 875-125 MG PO TABS: 1.0000 | ORAL_TABLET | Freq: Two times a day (BID) | ORAL | 0 refills | Status: DC

## 2023-10-23 NOTE — Progress Notes (Signed)
 Acute Office Visit  Subjective:     Patient ID: Phillip Wall, male    DOB: 23-Oct-1950, 73 y.o.   MRN: [id number]  Chief Complaint  Patient presents with   Sinusitis    Sinus drainage, bilateral ear pain, congestion, cough, fever 100.1 on Saturday    HPI  Discussed the use of AI scribe software for clinical note transcription with the patient, who gave verbal consent to proceed.  History of Present Illness   The patient, with a history of hay fever, presents with a runny nose and ear pain. He reports that his symptoms began six days ago with a feeling of coldness and a low-grade fever. The fever broke, but the patient has been left with a persistent runny nose and a cough that he believes is due to postnasal drip. The cough is productive with light yellow sputum. The patient also reports that his ears pop and hurt when he blows his nose, and he has noticed a slight muffling of his hearing. He has been taking Zyrtec and Mucinex  to manage his symptoms.      ROS See pertinent positives and negatives per HPI.     Objective:    BP 136/64 (BP Location: Left Arm, Patient Position: Sitting, Cuff Size: Normal)   Pulse 69   Temp 98 F (36.7 C) (Oral)   Ht 6\' 1"  (1.854 m)   Wt 171 lb 6.4 oz (77.7 kg)   SpO2 98%   BMI 22.61 kg/m    Physical Exam Vitals and nursing note reviewed.  Constitutional:      Appearance: Normal appearance.  HENT:     Head: Normocephalic.     Right Ear: Ear canal and external ear normal. A middle ear effusion is present. Tympanic membrane is erythematous.     Left Ear: Ear canal and external ear normal. A middle ear effusion is present. Tympanic membrane is erythematous.     Nose: Congestion and rhinorrhea present.     Right Sinus: No maxillary sinus tenderness or frontal sinus tenderness.     Left Sinus: No maxillary sinus tenderness or frontal sinus tenderness.  Eyes:     Conjunctiva/sclera: Conjunctivae normal.  Cardiovascular:     Rate and  Rhythm: Normal rate and regular rhythm.     Pulses: Normal pulses.     Heart sounds: Normal heart sounds.  Pulmonary:     Effort: Pulmonary effort is normal.     Breath sounds: Normal breath sounds.  Musculoskeletal:     Cervical back: Normal range of motion.  Skin:    General: Skin is warm.  Neurological:     General: No focal deficit present.     Mental Status: He is alert and oriented to person, place, and time.  Psychiatric:        Mood and Affect: Mood normal.        Behavior: Behavior normal.        Thought Content: Thought content normal.        Judgment: Judgment normal.       Assessment & Plan:   Problem List Items Addressed This Visit   None Visit Diagnoses       Acute otitis media, unspecified otitis media type    -  Primary   Start augmentin  BID x7 days. Can continue zyrtec and start flonase  nasal spray. F/U if not improving   Relevant Medications   amoxicillin -clavulanate (AUGMENTIN ) 875-125 MG tablet     Upper respiratory tract infection, unspecified type  This is managed with Zyrtec and Mucinex . Recommend starting Flonase  nasal spray daily. Continue Zyrtec and Mucinex .       Meds ordered this encounter  Medications   amoxicillin -clavulanate (AUGMENTIN ) 875-125 MG tablet    Sig: Take 1 tablet by mouth 2 (two) times daily.    Dispense:  14 tablet    Refill:  0   fluticasone  (FLONASE ) 50 MCG/ACT nasal spray    Sig: Place 2 sprays into both nostrils daily.    Dispense:  16 g    Refill:  0    Return if symptoms worsen or fail to improve.  Odette Benjamin, NP

## 2023-10-23 NOTE — Patient Instructions (Signed)
 It was great to see you!  Start augmentin  twice a day for 7 days  Start flonase  nasal spray daily  Continue mucinex , zyrtec  Get rest and plenty of fluids  Let's follow-up if your symptoms worsen or don't improve   Take care,  Rheba Cedar, NP

## 2023-10-24 MED ORDER — AMOXICILLIN-POT CLAVULANATE 600-42.9 MG/5ML PO SUSR: 7.5000 mL | Freq: Two times a day (BID) | ORAL | 0 refills | Status: AC

## 2023-10-24 NOTE — Addendum Note (Signed)
 Addended by: Christianna Cowman A on: 10/24/2023 11:59 AM   Modules accepted: Orders

## 2023-10-25 ENCOUNTER — Encounter: Payer: Self-pay | Admitting: Family

## 2023-10-25 ENCOUNTER — Other Ambulatory Visit: Payer: Self-pay | Admitting: Family

## 2023-10-25 ENCOUNTER — Ambulatory Visit (INDEPENDENT_AMBULATORY_CARE_PROVIDER_SITE_OTHER): Admitting: Family Medicine

## 2023-10-25 ENCOUNTER — Encounter: Payer: Self-pay | Admitting: Family Medicine

## 2023-10-25 VITALS — BP 142/78 | HR 72 | Temp 97.3°F | Ht 73.0 in | Wt 172.6 lb

## 2023-10-25 DIAGNOSIS — I2694 Multiple subsegmental pulmonary emboli without acute cor pulmonale: Secondary | ICD-10-CM

## 2023-10-25 DIAGNOSIS — Z1211 Encounter for screening for malignant neoplasm of colon: Secondary | ICD-10-CM | POA: Diagnosis not present

## 2023-10-25 DIAGNOSIS — R760 Raised antibody titer: Secondary | ICD-10-CM

## 2023-10-25 DIAGNOSIS — I10 Essential (primary) hypertension: Secondary | ICD-10-CM

## 2023-10-25 DIAGNOSIS — D6859 Other primary thrombophilia: Secondary | ICD-10-CM

## 2023-10-25 LAB — BASIC METABOLIC PANEL WITH GFR
BUN: 20 mg/dL (ref 6–23)
CO2: 28 meq/L (ref 19–32)
Calcium: 9.5 mg/dL (ref 8.4–10.5)
Chloride: 104 meq/L (ref 96–112)
Creatinine, Ser: 1.31 mg/dL (ref 0.40–1.50)
GFR: 53.98 mL/min — ABNORMAL LOW (ref >60.00)
Glucose, Bld: 83 mg/dL (ref 70–99)
Potassium: 4.2 meq/L (ref 3.5–5.1)
Sodium: 139 meq/L (ref 135–145)

## 2023-10-25 MED ORDER — AMLODIPINE BESYLATE 10 MG PO TABS
ORAL_TABLET | ORAL | 0 refills | Status: AC
Start: 2023-10-25 — End: ?

## 2023-10-25 MED ORDER — RIVAROXABAN 10 MG PO TABS: 10.0000 mg | ORAL_TABLET | Freq: Every day | ORAL | 6 refills | Status: DC

## 2023-10-25 MED ORDER — TELMISARTAN 40 MG PO TABS: 40.0000 mg | ORAL_TABLET | Freq: Every day | ORAL | 1 refills | Status: AC

## 2023-10-25 NOTE — Progress Notes (Signed)
 Thank you do I still need to put it in here  Established Patient Office Visit   Subjective:  Patient ID: Phillip Wall, male    DOB: February 23, 1951  Age: 73 y.o. MRN: [id number]  Chief Complaint  Patient presents with   Medical Management of Chronic Issues    Follow up. Pt is not fasting.     HPI Encounter Diagnoses  Name Primary?   Essential hypertension Yes   Screening for colon cancer    For follow-up of hypertension.  Due for colonoscopy.  Blood pressures been running in the upper 130s over 80s on 20 of telmisartan  and 10 of amlodipine .  Insurance stopped paying for his Xarelto  for the spontaneous PE.  He is now taking 2 aspirin by hematology.  There has been mild stomach upset.  [facility] .  Recently diagnosed with otitis media.  Was given Augmentin .  Recently started Flonase .   Review of Systems  Constitutional: Negative.   HENT: Negative.    Eyes:  Negative for blurred vision, discharge and redness.  Respiratory: Negative.    Cardiovascular: Negative.   Gastrointestinal:  Negative for abdominal pain.  Genitourinary: Negative.   Musculoskeletal: Negative.  Negative for myalgias.  Skin:  Negative for rash.  Neurological:  Negative for tingling, loss of consciousness and weakness.  Endo/Heme/Allergies:  Negative for polydipsia.     Current Outpatient Medications:    amoxicillin -clavulanate (AUGMENTIN  ES-600) 600-42.9 MG/5ML suspension, Take 7.5 mLs by mouth 2 (two) times daily for 7 days., Disp: 200 mL, Rfl: 0   ARTIFICIAL TEAR OP, Place 1 drop into both eyes daily as needed (dry eyes)., Disp: , Rfl:    aspirin EC 81 MG tablet, Take 2 tablets by mouth daily., Disp: , Rfl:    citalopram  (CELEXA ) 10 MG tablet, Take 1 tablet (10 mg total) by mouth at bedtime., Disp: 30 tablet, Rfl: 3   docusate sodium  (COLACE) 100 MG capsule, Take 1 capsule (100 mg total) by mouth 2 (two) times daily., Disp: , Rfl:    famotidine  (PEPCID )  40 MG tablet, Take 1 tablet (40 mg total) by mouth daily as needed for indigestion., Disp: 90 tablet, Rfl: 1   fluticasone  (FLONASE ) 50 MCG/ACT nasal spray, SHAKE LIQUID AND USE 2 SPRAYS IN EACH NOSTRIL DAILY, Disp: 48 g, Rfl: 0   telmisartan  (MICARDIS ) 40 MG tablet, Take 1 tablet (40 mg total) by mouth daily., Disp: 90 tablet, Rfl: 1   tiZANidine  (ZANAFLEX ) 4 MG tablet, Take 1 tablet (4 mg total) by mouth every 8 (eight) hours as needed for muscle spasms., Disp: 30 tablet, Rfl: 0   amLODipine  (NORVASC ) 10 MG tablet, TAKE 1 TABLET(10 MG) BY MOUTH DAILY, Disp: 90 tablet, Rfl: 0   Objective:     BP (!) 142/78 (Patient Position: Sitting, Cuff Size: Normal)   Pulse 72   Temp (!) 97.3 F (36.3 C) (Temporal)   Ht 6\' 1"  (1.854 m)   Wt 172 lb 9.6 oz (78.3 kg)   SpO2 98%   BMI 22.77 kg/m    Physical Exam Constitutional:      General: He is not in acute distress.    Appearance: Normal appearance. He is not ill-appearing, toxic-appearing or diaphoretic.  HENT:     Head: Normocephalic and atraumatic.     Right Ear: Tympanic membrane, ear canal and external ear normal.     Left Ear: External ear normal. A middle ear effusion is present. Tympanic membrane is not  erythematous.     Mouth/Throat:     Mouth: Mucous membranes are moist.     Pharynx: Oropharynx is clear. No oropharyngeal exudate or posterior oropharyngeal erythema.  Eyes:     General: No scleral icterus.       Right eye: No discharge.        Left eye: No discharge.     Extraocular Movements: Extraocular movements intact.     Conjunctiva/sclera: Conjunctivae normal.     Pupils: Pupils are equal, round, and reactive to light.  Cardiovascular:     Rate and Rhythm: Normal rate and regular rhythm.  Pulmonary:     Effort: Pulmonary effort is normal. No respiratory distress.     Breath sounds: Normal breath sounds. No wheezing or rales.  Abdominal:     General: Bowel sounds are normal.  Musculoskeletal:     Cervical back: No  rigidity or tenderness.  Skin:    General: Skin is warm and dry.  Neurological:     Mental Status: He is alert and oriented to person, place, and time.  Psychiatric:        Mood and Affect: Mood normal.        Behavior: Behavior normal.      No results found for any visits on 10/25/23.    The 10-year ASCVD risk score (Arnett DK, et al., 2019) is: 23%    Assessment & Plan:   Essential hypertension -     amLODIPine  Besylate; TAKE 1 TABLET(10 MG) BY MOUTH DAILY  Dispense: 90 tablet; Refill: 0 -     Telmisartan ; Take 1 tablet (40 mg total) by mouth daily.  Dispense: 90 tablet; Refill: 1 -     Basic metabolic panel with GFR  Screening for colon cancer -     Ambulatory referral to Gastroenterology    Return in about 5 months (around 03/26/2024) for annual physical, chronic disease follow-up.  Follow-up with hematology about obtaining Xarelto  from the [place].  Discussed ETD exercises for middle ear effusion.  Continue Flonase .  Have increased telmisartan  to 40 mg daily.  Check and record blood pressures.  Tonna Frederic, MD

## 2023-10-28 ENCOUNTER — Encounter: Payer: Self-pay | Admitting: Family Medicine

## 2023-10-31 ENCOUNTER — Ambulatory Visit: Admitting: Family Medicine

## 2023-11-07 ENCOUNTER — Encounter: Payer: Self-pay | Admitting: Family Medicine

## 2023-11-07 ENCOUNTER — Encounter: Payer: Self-pay | Admitting: Family

## 2023-11-22 ENCOUNTER — Other Ambulatory Visit: Payer: Self-pay | Admitting: Family Medicine

## 2023-11-22 DIAGNOSIS — I10 Essential (primary) hypertension: Secondary | ICD-10-CM

## 2023-11-26 ENCOUNTER — Encounter (INDEPENDENT_AMBULATORY_CARE_PROVIDER_SITE_OTHER): Payer: Medicare Other | Admitting: Family Medicine

## 2023-11-26 NOTE — Progress Notes (Signed)
 Error. Pt could not do appt today.

## 2023-12-04 ENCOUNTER — Telehealth: Payer: Self-pay | Admitting: Gastroenterology

## 2023-12-04 IMAGING — MR MR ABDOMEN WO/W CM
18 series · 48 of 48 positions shown · IV contrast (8 GADAVIST)
Comparison: CTA chest 07/06/2021.

CLINICAL DATA: Right renal lesion identified on recent CTA chest.

EXAM:
MRI ABDOMEN WITHOUT AND WITH CONTRAST
TECHNIQUE: Multiplanar multisequence MR imaging of the abdomen was performed
both before and after the administration of intravenous contrast.
CONTRAST:  8mL GADAVIST GADOBUTROL 1 MMOL/ML IV SOLN

[Series 2: DWI · axial · 6.0mm · 1.49mm/px · z∈[-164,+88]mm · 4 of 72 slices shown (1 of 2)]
[im 1/72]
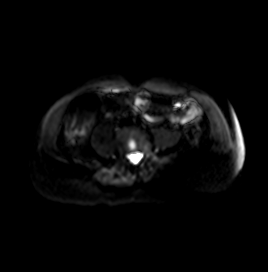
[im 24/72]
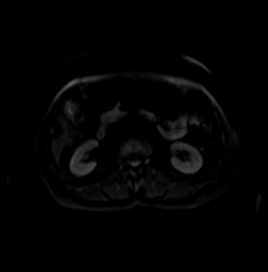
[im 48/72]
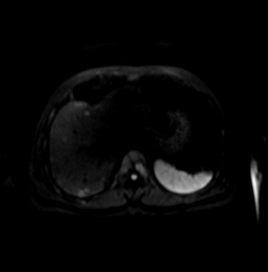
[im 72/72]
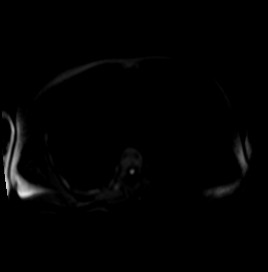

[Series 3: DWI · axial · 6.0mm · 1.49mm/px · z∈[-164,+88]mm · 2 of 36 slices shown (2 of 2)]
[im 1/36]
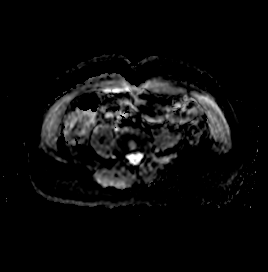
[im 36/36]
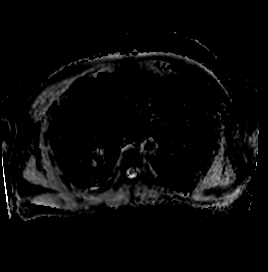

[Series 4: T2 fat-sat · axial · 6.0mm · 1.25mm/px · z∈[-170,+82]mm · 2 of 36 slices shown]
[im 1/36]
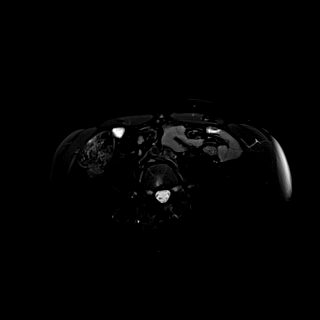
[im 36/36]
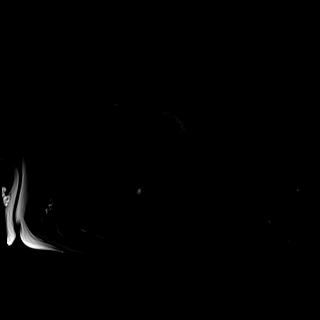

[Series 7: T2 · coronal · 6.0mm · 1.56mm/px · 2 of 36 slices shown (1 of 2)]
[im 1/36]
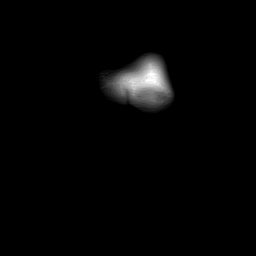
[im 36/36]
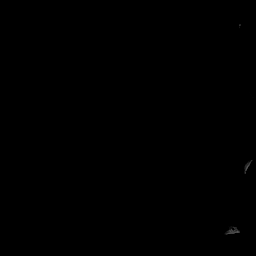

[Series 8: T1 · axial · 3.0mm · 1.25mm/px · z∈[-188,+49]mm · 3 of 80 slices shown (1 of 2)]
[im 1/80]
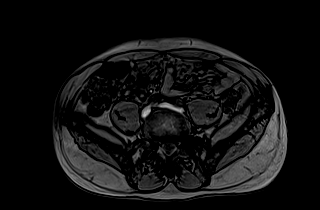
[im 40/80]
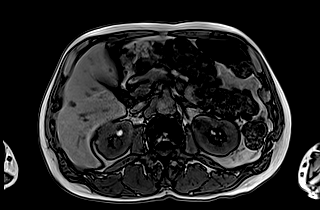
[im 80/80]
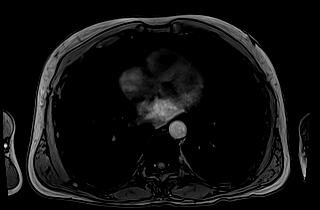

[Series 9: T1 · axial · 3.0mm · 1.25mm/px · z∈[-188,+49]mm · 3 of 80 slices shown (2 of 2)]
[im 1/80]
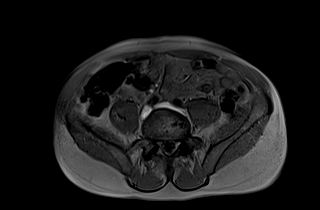
[im 40/80]
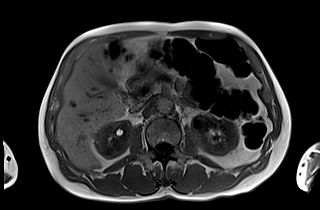
[im 80/80]
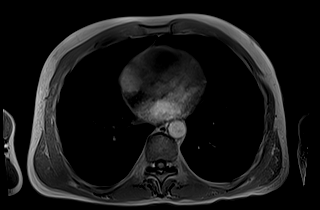

[Series 10: bSSFP · axial · 5.0mm · 0.78mm/px · z∈[-210,+49]mm · 2 of 48 slices shown]
[im 1/48]
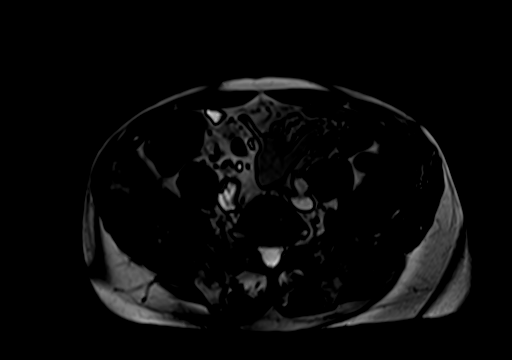
[im 48/48]
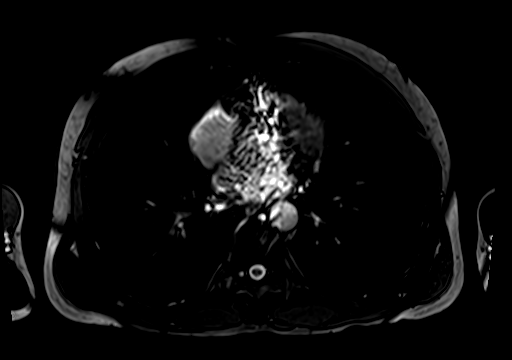

[Series 12: T1 dynamic · axial · 3.0mm · 1.25mm/px · z∈[-187,+50]mm · 3 of 80 slices shown (1 of 10)]
[im 1/80]
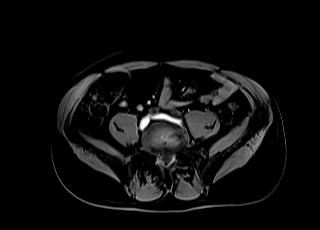
[im 40/80]
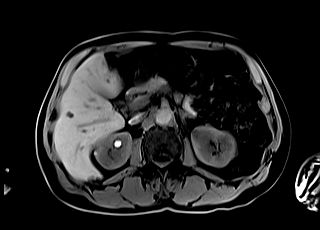
[im 80/80]
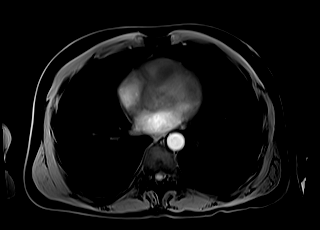

[Series 16: T1 dynamic · axial · 3.0mm · 1.25mm/px · z∈[-187,+50]mm · 3 of 80 slices shown (2 of 10)]
[im 1/80]
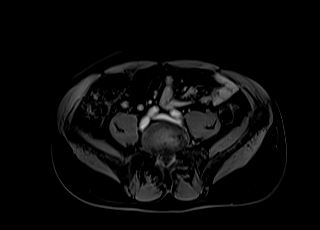
[im 40/80]
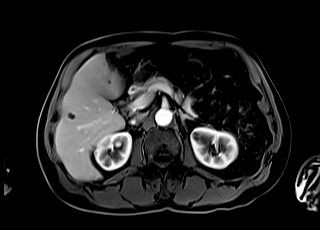
[im 80/80]
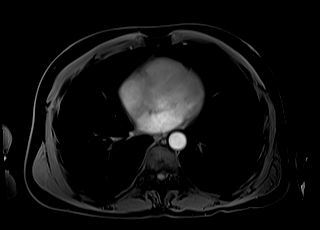

[Series 17: T1 dynamic · axial · 3.0mm · 1.25mm/px · z∈[-187,+50]mm · 3 of 80 slices shown (3 of 10)]
[im 1/80]
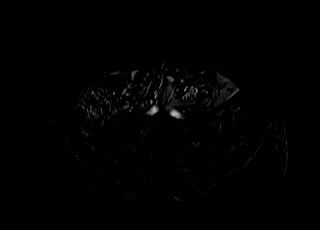
[im 40/80]
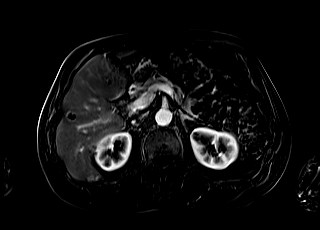
[im 80/80]
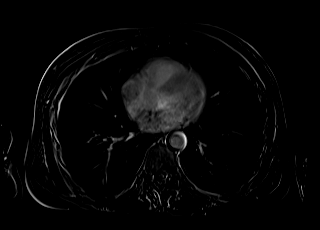

[Series 20: T1 dynamic · axial · 3.0mm · 1.25mm/px · z∈[-187,+50]mm · 3 of 80 slices shown (4 of 10)]
[im 1/80]
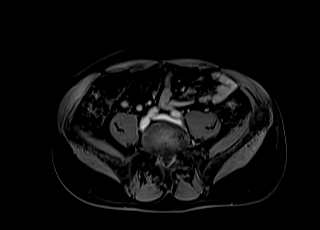
[im 40/80]
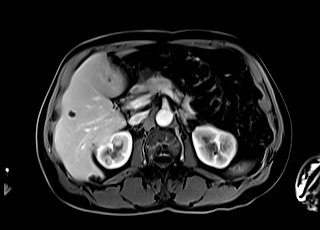
[im 80/80]
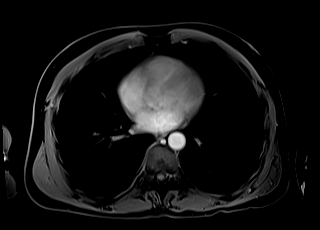

[Series 21: T1 dynamic · axial · 3.0mm · 1.25mm/px · z∈[-187,+50]mm · 3 of 80 slices shown (5 of 10)]
[im 1/80]
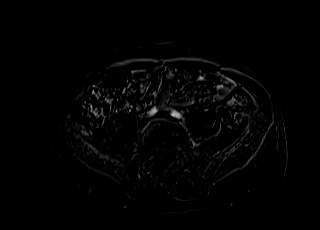
[im 40/80]
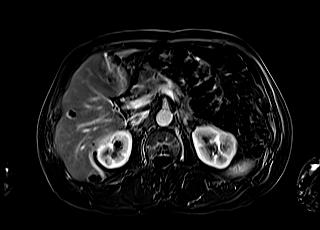
[im 80/80]
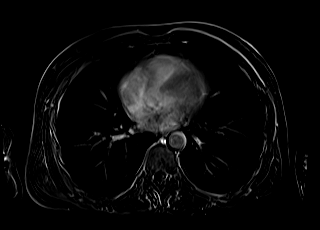

[Series 24: T1 dynamic · axial · 3.0mm · 1.25mm/px · z∈[-187,+50]mm · 3 of 80 slices shown (6 of 10)]
[im 1/80]
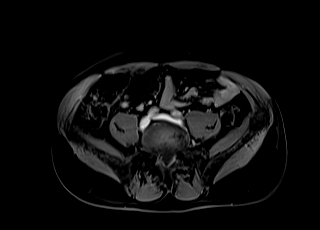
[im 40/80]
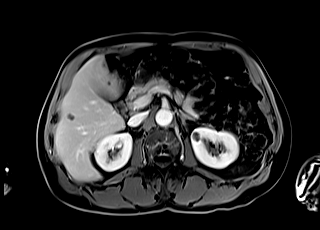
[im 80/80]
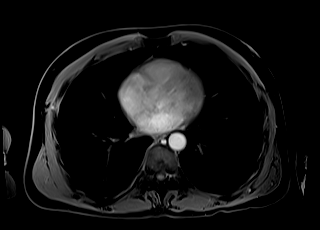

[Series 25: T1 dynamic · axial · 3.0mm · 1.25mm/px · z∈[-187,+50]mm · 3 of 80 slices shown (7 of 10)]
[im 1/80]
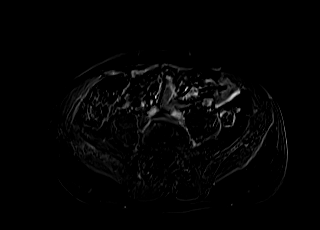
[im 40/80]
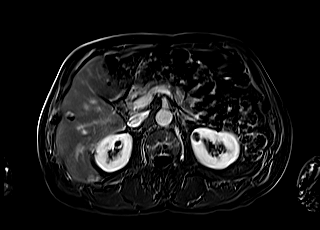
[im 80/80]
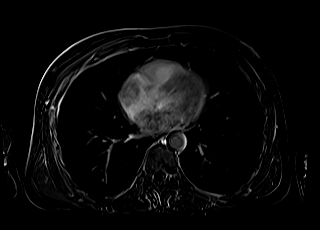

[Series 27: T1 dynamic · coronal · 4.7mm · 1.25mm/px · 2 of 56 slices shown (8 of 10)]
[im 1/56]
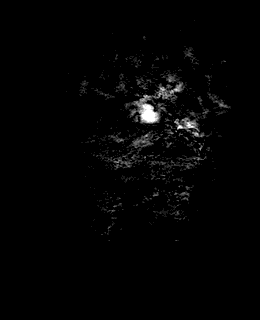
[im 56/56]
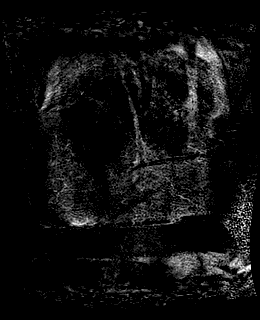

[Series 28: T2 · axial · 6.0mm · 1.56mm/px · 1 of 34 slices shown (2 of 2)]
[im 1/34]
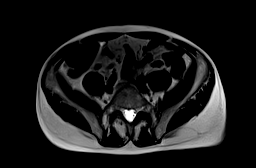

[Series 31: T1 dynamic · axial · 3.0mm · 1.25mm/px · z∈[-187,+50]mm · 3 of 80 slices shown (9 of 10)]
[im 1/80]
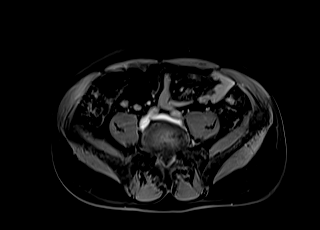
[im 40/80]
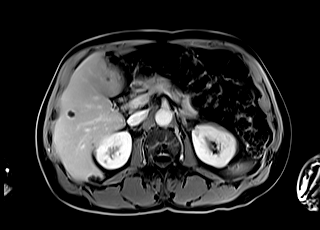
[im 80/80]
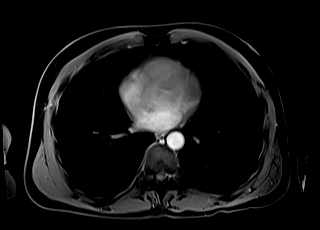

[Series 32: T1 dynamic · axial · 3.0mm · 1.25mm/px · z∈[-187,+50]mm · 3 of 80 slices shown (10 of 10)]
[im 1/80]
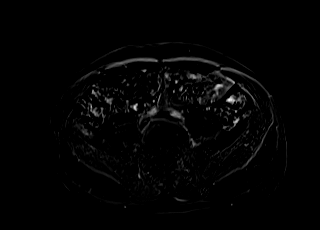
[im 40/80]
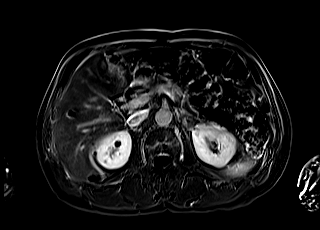
[im 80/80]
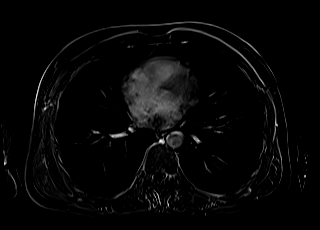

[48 of 48 positions shown; findings below may reference images not displayed]

FINDINGS: Lower chest: Chest unremarkable.

Hepatobiliary: Numerous hepatic cysts are evident measuring up to
maximum size of approximately 2.1 cm. No suspicious enhancing mass
lesion evident within the liver. There is no evidence for
gallstones, gallbladder wall thickening, or pericholecystic fluid.
No intrahepatic or extrahepatic biliary dilation.

Pancreas: No focal mass lesion. No dilatation of the main duct. No
intraparenchymal cyst. No peripancreatic edema.

Spleen:  No splenomegaly. No focal mass lesion.

Adrenals/Urinary Tract:  No adrenal nodule or mass.

1.2 x 1.3 x 1.2 cm heterogeneous lesion is identified in the lateral
aspect of the upper pole right kidney as noted on recent CTA chest.
This lesion demonstrates heterogeneous enhancement after IV contrast
administration (see axial T1 postcontrast image 44 of series 16 and
subtraction image 44 of series 17. Imaging features compatible with
a small renal cell carcinoma. Lesion does not extend into the
central sinus fat and coronal T2 imaging suggests that fat plane
between the lesion and the adjacent liver capsule is preserved. No
other suspicious abnormality in the right kidney.

1.3 cm cystic lesion in the left kidney shows no internal septation
on T2 imaging but there is a suggestion of thin enhancing internal
septation on postcontrast T1 imaging (see axial postcontrast 90
second image 42 of series 24. Additional scattered tiny
hypoattenuating lesions in the left kidney are too small to
characterize but likely benign.

Stomach/Bowel: Stomach is unremarkable. No gastric wall thickening.
No evidence of outlet obstruction. Duodenum is normally positioned
as is the ligament of Treitz. No small bowel or colonic dilatation
within the visualized abdomen.

Vascular/Lymphatic: No abdominal aortic aneurysm. No abdominal
lymphadenopathy. No filling defect in the left renal vein.

Other:  No intraperitoneal free fluid.

Musculoskeletal: No worrisome lytic or sclerotic osseous
abnormality.
IMPRESSION: 1. 1.2 x 1.3 x 1.2 cm heterogeneously enhancing lesion in the
lateral aspect of the upper pole right kidney as noted on recent CTA
chest. Imaging features are compatible with a small renal cell
carcinoma. Right renal vein is patent.
2. 1.3 cm cystic lesion in the left kidney with a suggestion of thin
enhancing internal septation on postcontrast T1 imaging. Imaging
features are consistent with a Bosniak II cyst.
3. Numerous hepatic cysts.

## 2023-12-04 NOTE — Telephone Encounter (Signed)
 Patient wife called and stated that she was able to fins her husband last colonoscopy done in 2017. Patient wife stated that she will fax those records over to us . Patient wife is requesting that Dr. Elvin Hammer review his records and advise on scheduling for a direct colon. Please advise.

## 2023-12-27 ENCOUNTER — Inpatient Hospital Stay: Payer: Medicare Other | Attending: Hematology & Oncology

## 2023-12-27 DIAGNOSIS — I2699 Other pulmonary embolism without acute cor pulmonale: Secondary | ICD-10-CM | POA: Diagnosis present

## 2023-12-27 DIAGNOSIS — R760 Raised antibody titer: Secondary | ICD-10-CM

## 2023-12-27 DIAGNOSIS — I2694 Multiple subsegmental pulmonary emboli without acute cor pulmonale: Secondary | ICD-10-CM

## 2023-12-27 LAB — CMP (CANCER CENTER ONLY)
ALT: 10 U/L (ref 0–44)
AST: 12 U/L — ABNORMAL LOW (ref 15–41)
Albumin: 4.6 g/dL (ref 3.5–5.0)
Alkaline Phosphatase: 62 U/L (ref 38–126)
Anion gap: 7 (ref 5–15)
BUN: 16 mg/dL (ref 8–23)
CO2: 27 mmol/L (ref 22–32)
Calcium: 9.5 mg/dL (ref 8.9–10.3)
Chloride: 103 mmol/L (ref 98–111)
Creatinine: 1.46 mg/dL — ABNORMAL HIGH (ref 0.61–1.24)
GFR, Estimated: 50 mL/min — ABNORMAL LOW (ref >60)
Glucose, Bld: 91 mg/dL (ref 70–99)
Potassium: 4.2 mmol/L (ref 3.5–5.1)
Sodium: 137 mmol/L (ref 135–145)
Total Bilirubin: 0.9 mg/dL (ref 0.0–1.2)
Total Protein: 7.2 g/dL (ref 6.5–8.1)

## 2023-12-27 LAB — CBC WITH DIFFERENTIAL (CANCER CENTER ONLY)
Abs Immature Granulocytes: 0.05 10*3/uL (ref 0.00–0.07)
Basophils Absolute: 0 10*3/uL (ref 0.0–0.1)
Basophils Relative: 1 %
Eosinophils Absolute: 0.1 10*3/uL (ref 0.0–0.5)
Eosinophils Relative: 1 %
HCT: 37.8 % — ABNORMAL LOW (ref 39.0–52.0)
Hemoglobin: 13.3 g/dL (ref 13.0–17.0)
Immature Granulocytes: 1 %
Lymphocytes Relative: 28 %
Lymphs Abs: 1.9 10*3/uL (ref 0.7–4.0)
MCH: 29.2 pg (ref 26.0–34.0)
MCHC: 35.2 g/dL (ref 30.0–36.0)
MCV: 82.9 fL (ref 80.0–100.0)
Monocytes Absolute: 0.5 10*3/uL (ref 0.1–1.0)
Monocytes Relative: 8 %
Neutro Abs: 4.1 10*3/uL (ref 1.7–7.7)
Neutrophils Relative %: 61 %
Platelet Count: 219 10*3/uL (ref 150–400)
RBC: 4.56 MIL/uL (ref 4.22–5.81)
RDW: 13.9 % (ref 11.5–15.5)
WBC Count: 6.6 10*3/uL (ref 4.0–10.5)
nRBC: 0 % (ref 0.0–0.2)

## 2023-12-28 LAB — LUPUS ANTICOAGULANT PANEL
DRVVT: 31.7 s (ref 0.0–47.0)
PTT Lupus Anticoagulant: 31.6 s (ref 0.0–43.5)

## 2024-01-08 ENCOUNTER — Encounter: Payer: Self-pay | Admitting: Family

## 2024-01-08 DIAGNOSIS — R760 Raised antibody titer: Secondary | ICD-10-CM

## 2024-01-08 DIAGNOSIS — I2694 Multiple subsegmental pulmonary emboli without acute cor pulmonale: Secondary | ICD-10-CM

## 2024-01-08 DIAGNOSIS — D6859 Other primary thrombophilia: Secondary | ICD-10-CM

## 2024-01-09 ENCOUNTER — Encounter: Admitting: Family Medicine

## 2024-01-09 MED ORDER — RIVAROXABAN 10 MG PO TABS: 10.0000 mg | ORAL_TABLET | Freq: Every day | ORAL | 6 refills | Status: DC

## 2024-01-09 MED ORDER — RIVAROXABAN 10 MG PO TABS: 10.0000 mg | ORAL_TABLET | Freq: Every day | ORAL | 6 refills | Status: AC

## 2024-01-09 NOTE — Progress Notes (Deleted)
 PATIENT CHECK-IN and HEALTH RISK ASSESSMENT QUESTIONNAIRE:  -completed by phone/video for upcoming Medicare Preventive Visit  -Please select NOT IN PERSON for method of visit.  FIRST check to see if the patient completed the online questionnaire - if so, this can be found under the rooming tab, then go to the questionnaires tab. Some of the questions are the same and you can use the answers to complete all of the bold questions below before calling the patient. Question #s below: 6 (fall risk screening), under habits # 1, 2, 3, 8 and 9,  under everyday activities #2, 3 and 8.   Pre-Visit Check-in: 1)Vitals (height, wt, BP, etc) - record in vitals section for visit on day of visit Request home vitals (wt, BP, etc.) and enter into vitals, THEN update Vital Signs SmartPhrase below at the top of the HPI. See below.  2)Review and Update Medications, Allergies PMH, Surgeries, Social history in Epic 3)Hospitalizations in the last year with date/reason? ***  4)Review and Update Care Team (patient's specialists) in Epic 5) Complete PHQ9 in Epic  6) Complete Fall Screening in Epic 7)Review all Health Maintenance Due and order under PCP if not done.  Medicare Wellness Patient Questionnaire:  Answer theses question about your habits: How often do you have a drink containing alcohol ?*** How many drinks containing alcohol  do you have on a typical day when you are drinking?*** How often do you have six or more drinks on one occasion?*** Have you ever smoked?*** Quit date if applicable? ***  How many packs a day do/did you smoke? *** Do you use smokeless tobacco?*** Do you use an illicit drugs?*** On average, how many days per week do you engage in moderate to strenuous exercise (like a brisk walk)?*** On average, how many minutes do you engage in exercise at this level?*** Are you sexually active? ***Number of partners?*** Typical breakfast**** Typical lunch*** Typical dinner*** Typical  snacks:****  Beverages: ***  Answer theses question about your everyday activities: Can you perform most household chores?*** Are you deaf or have significant trouble hearing?*** Do you feel that you have a problem with memory?*** Do you feel safe at home?*** Last dentist visit?*** 8. Do you have any difficulty performing your everyday activities?*** Are you having any difficulty walking, taking medications on your own, and or difficulty managing daily home needs?*** Do you have difficulty walking or climbing stairs?*** Do you have difficulty dressing or bathing?*** Do you have difficulty doing errands alone such as visiting a doctor's office or shopping?*** Do you currently have any difficulty preparing food and eating?*** Do you currently have any difficulty using the toilet?*** Do you have any difficulty managing your finances?*** Do you have any difficulties with housekeeping of managing your housekeeping?***   Do you have Advanced Directives in place (Living Will, Healthcare Power or Attorney)? ***   Last eye Exam and location?***   Do you currently use prescribed or non-prescribed narcotic or opioid pain medications?***  Do you have a history or close family history of breast, ovarian, tubal or peritoneal cancer or a family member with BRCA (breast cancer susceptibility 1 and 2) gene mutations?  ***Request home vitals (wt, BP, etc.) and enter into vitals, THEN update Vital Signs SmartPhrase below at the top of the HPI. See below.   Nurse/Assistant Credentials/time stamp:   ----------------------------------------------------------------------------------------------------------------------------------------------------------------------------------------------------------------------  Because this visit was a virtual/telehealth visit, some criteria may be missing or patient reported. Any vitals not documented were not able to be obtained and vitals that have  been documented  are patient reported.    MEDICARE ANNUAL PREVENTIVE CARE VISIT WITH PROVIDER (Welcome to Medicare, initial annual wellness or annual wellness exam)  Virtual Visit via Video ***Phone Note  I connected with Tod LITTIE Ober on 01/09/24  by phone *** a video enabled telemedicine application and verified that I am speaking with the correct person using two identifiers.  Location patient: home Location provider:work or home office Persons participating in the virtual visit: patient, provider  Concerns and/or follow up today:   See HM section in Epic for other details of completed HM.    ROS: negative for report of fevers, unintentional weight loss, vision changes, vision loss, hearing loss or change, chest pain, sob, hemoptysis, melena, hematochezia, hematuria, falls, bleeding or bruising, thoughts of suicide or self harm, memory loss  Patient-completed extensive health risk assessment - reviewed and discussed with the patient: See Health Risk Assessment completed with patient prior to the visit either above or in recent phone note. This was reviewed in detailed with the patient today and appropriate recommendations, orders and referrals were placed as needed per Summary below and patient instructions.   Review of Medical History: -PMH, PSH, Family History and current specialty and care providers reviewed and updated and listed below   Patient Care Team: Berneta Elsie Sayre, MD as PCP - General (Family Medicine)   Past Medical History:  Diagnosis Date   Anxiety    Depression    ED (erectile dysfunction)    GERD (gastroesophageal reflux disease)    History of colonic polyps    Hypertension     Past Surgical History:  Procedure Laterality Date   ROBOTIC ASSITED PARTIAL NEPHRECTOMY Right 10/30/2021   Procedure: XI ROBOTIC ASSITED  LAPAROSCOPIC PARTIAL NEPHRECTOMY;  Surgeon: Renda Glance, MD;  Location: [facility];  Service: Urology;  Laterality: Right;   TONSILECTOMY/ADENOIDECTOMY  WITH MYRINGOTOMY     VASECTOMY  1986    Social History   Socioeconomic History   Marital status: Married    Spouse name: Not on file   Number of children: 3   Years of education: Not on file   Highest education level: Not on file  Occupational History   Occupation: retired  Tobacco Use   Smoking status: Never   Smokeless tobacco: Never  Vaping Use   Vaping status: Never Used  Substance and Sexual Activity   Alcohol  use: Not Currently   Drug use: Never   Sexual activity: Yes  Other Topics Concern   Not on file  Social History Narrative   Not on file   Social Drivers of Health   Financial Resource Strain: Low Risk  (11/19/2022)   Overall Financial Resource Strain (CARDIA)    Difficulty of Paying Living Expenses: Not hard at all  Food Insecurity: No Food Insecurity (11/19/2022)   Hunger Vital Sign    Worried About Running Out of Food in the Last Year: Never true    Ran Out of Food in the Last Year: Never true  Transportation Needs: No Transportation Needs (11/19/2022)   PRAPARE - Administrator, Civil Service (Medical): No    Lack of Transportation (Non-Medical): No  Physical Activity: Insufficiently Active (11/19/2022)   Exercise Vital Sign    Days of Exercise per Week: 3 days    Minutes of Exercise per Session: 30 min  Stress: No Stress Concern Present (11/19/2022)   [employer] of Occupational Health - Occupational Stress Questionnaire    Feeling of Stress : Not at  all  Social Connections: Moderately Integrated (11/15/2021)   Social Connection and Isolation Panel    Frequency of Communication with Friends and Family: Three times a week    Frequency of Social Gatherings with Friends and Family: Three times a week    Attends Religious Services: More than 4 times per year    Active Member of Clubs or Organizations: No    Attends Banker Meetings: Never    Marital Status: Married  Catering manager Violence: Not At Risk (11/15/2021)    Humiliation, Afraid, Rape, and Kick questionnaire    Fear of Current or Ex-Partner: No    Emotionally Abused: No    Physically Abused: No    Sexually Abused: No    Family History  Problem Relation Age of Onset   Bladder Cancer Mother    Heart disease Father    Heart attack Father    Breast cancer Paternal Grandmother    Breast cancer Daughter    Alcohol  abuse Sister    Hypertension Sister    Cancer Sister    Drug abuse Sister     Current Outpatient Medications on File Prior to Visit  Medication Sig Dispense Refill   amLODipine  (NORVASC ) 10 MG tablet TAKE 1 TABLET(10 MG) BY MOUTH DAILY 90 tablet 0   ARTIFICIAL TEAR OP Place 1 drop into both eyes daily as needed (dry eyes).     citalopram  (CELEXA ) 10 MG tablet Take 1 tablet (10 mg total) by mouth at bedtime. 30 tablet 3   docusate sodium  (COLACE) 100 MG capsule Take 1 capsule (100 mg total) by mouth 2 (two) times daily.     famotidine  (PEPCID ) 40 MG tablet Take 1 tablet (40 mg total) by mouth daily as needed for indigestion. 90 tablet 1   fluticasone  (FLONASE ) 50 MCG/ACT nasal spray SHAKE LIQUID AND USE 2 SPRAYS IN EACH NOSTRIL DAILY 48 g 0   rivaroxaban  (XARELTO ) 10 MG TABS tablet Take 1 tablet (10 mg total) by mouth daily. 30 tablet 6   telmisartan  (MICARDIS ) 40 MG tablet Take 1 tablet (40 mg total) by mouth daily. 90 tablet 1   tiZANidine  (ZANAFLEX ) 4 MG tablet Take 1 tablet (4 mg total) by mouth every 8 (eight) hours as needed for muscle spasms. 30 tablet 0   No current facility-administered medications on file prior to visit.    No Known Allergies     Physical Exam Vitals requested from patient and listed below if patient had equipment and was able to obtain at home for this virtual visit: There were no vitals filed for this visit. Estimated body mass index is 22.77 kg/m as calculated from the following:   Height as of 10/25/23: 6' 1 (1.854 m).   Weight as of 10/25/23: 172 lb 9.6 oz (78.3 kg).  EKG (optional):  deferred due to virtual visit  GENERAL: alert, oriented, no acute distress detected; full vision exam deferred due to pandemic and/or virtual encounter  ***  HEENT: atraumatic, conjunttiva clear, no obvious abnormalities on inspection of external nose and ears  NECK: normal movements of the head and neck  LUNGS: on inspection no signs of respiratory distress, breathing rate appears normal, no obvious gross SOB, gasping or wheezing  CV: no obvious cyanosis  MS: moves all visible extremities without noticeable abnormality  PSYCH/NEURO: pleasant and cooperative, no obvious depression or anxiety, speech and thought processing grossly intact, Cognitive function grossly intact  [employer] Visit from 02/12/2023 in [facility]  PHQ-9 Total Score 0        02/12/2023   10:03 AM 11/19/2022    8:47 AM 10/22/2022    2:36 PM 09/17/2022   10:25 AM 07/27/2022    1:59 PM  Depression screen PHQ 2/9  Decreased Interest 0 0 0 0 0  Down, Depressed, Hopeless 0 0 0 1 0  PHQ - 2 Score 0 0 0 1 0  Altered sleeping 0      Tired, decreased energy 0      Change in appetite 0      Feeling bad or failure about yourself  0      Trouble concentrating 0      Moving slowly or fidgety/restless 0      Suicidal thoughts 0      PHQ-9 Score 0      Difficult doing work/chores Not difficult at all           07/27/2022    1:59 PM 09/17/2022   10:25 AM 10/22/2022    2:36 PM 11/19/2022    8:47 AM 02/12/2023   10:03 AM  Fall Risk  Falls in the past year?  0 0 0 0  Was there an injury with Fall? 0 0 0 0 0  Fall Risk Category Calculator  0 0 0 0  Patient at Risk for Falls Due to No Fall Risks No Fall Risks No Fall Risks Medication side effect No Fall Risks  Fall risk Follow up Falls evaluation completed Falls evaluation completed Falls evaluation completed Falls prevention discussed;Education provided;Falls evaluation completed Falls evaluation completed     SUMMARY  AND PLAN:  No diagnosis found.  Visit coding *** [phone] (annual wellness visit -initial); G0439 (annual wellness subsequent); G0402 Welcome to Medicare(initial preventive physical exam)   Discussed applicable health maintenance/preventive health measures and advised and referred or ordered per patient preferences:  Health Maintenance  Topic Date Due   Colonoscopy  10/14/2013   Zoster Vaccines- Shingrix (2 of 2) 11/27/2022   Medicare Annual Wellness (AWV)  11/19/2023   OPHTHALMOLOGY EXAM  01/05/2024   INFLUENZA VACCINE  01/31/2024   DTaP/Tdap/Td (5 - Td or Tdap) 01/09/2032   Pneumococcal Vaccine: 50+ Years  Completed   Hepatitis C Screening  Completed   Hepatitis B Vaccines  Aged Out   HPV VACCINES  Aged Out   Meningococcal B Vaccine  Aged Out   COVID-19 Vaccine  Discontinued     Education and counseling on the following was provided based on the above review of health and a plan/checklist for the patient, along with additional information discussed, was provided for the patient in the patient instructions :  -Advised on importance of completing advanced directives, discussed options for completing and provided information in patient instructions as well -Provided counseling and plan for difficulty hearing  -Provided counseling and plan for increased risk of falling if applicable per above screening. Reviewed and demonstrated safe balance exercises that can be done at home to improve balance and discussed exercise guidelines for adults with include balance exercises at least 3 days per week.  -Advised and counseled on a healthy lifestyle - including the importance of a healthy diet, regular physical activity, social connections and stress management. -Reviewed patient's current diet. Advised and counseled on a whole foods based healthy diet. A summary of a healthy diet was provided in the Patient Instructions.  -reviewed patient's current physical activity level and discussed exercise  guidelines for adults. Discussed community resources and ideas for safe exercise  at home to assist in meeting exercise guideline recommendations in a safe and healthy way.  -Advise yearly dental visits at minimum and regular eye exams -Advised and counseled on alcohol  safe limits, risks/ tobacco use, risks of smoking and offered counseling/help, drug, opoid use/misuse   Follow up: see patient instructions   There are no Patient Instructions on file for this visit.  Chiquita JONELLE Cramp, DO

## 2024-01-09 NOTE — Telephone Encounter (Signed)
 Spoke with Phillip Wall (ok per DPR) and she stated that the [facility] pharmacy claims they never received the faxed Xarelto  prescription that was sent 10/25/23. She states they have been waiting to hear from the pharmacy about receiving the Rx but they never did. In the meantime pt has been taking Aspirin daily. [facility]. Phillip Wall aware to contact office with any concerns.

## 2024-01-14 ENCOUNTER — Ambulatory Visit: Admitting: Internal Medicine

## 2024-02-06 ENCOUNTER — Encounter: Payer: Self-pay | Admitting: Family Medicine

## 2024-02-06 DIAGNOSIS — L910 Hypertrophic scar: Secondary | ICD-10-CM

## 2024-02-21 ENCOUNTER — Ambulatory Visit: Payer: Self-pay | Admitting: Podiatry

## 2024-02-26 ENCOUNTER — Encounter: Payer: Self-pay | Admitting: Internal Medicine

## 2024-02-26 ENCOUNTER — Ambulatory Visit: Admitting: Internal Medicine

## 2024-02-26 VITALS — BP 134/70 | HR 76 | Ht 73.0 in | Wt 175.0 lb

## 2024-02-26 DIAGNOSIS — Z1211 Encounter for screening for malignant neoplasm of colon: Secondary | ICD-10-CM

## 2024-02-26 DIAGNOSIS — Z7901 Long term (current) use of anticoagulants: Secondary | ICD-10-CM

## 2024-02-26 DIAGNOSIS — Z01818 Encounter for other preprocedural examination: Secondary | ICD-10-CM

## 2024-02-26 DIAGNOSIS — Z860100 Personal history of colon polyps, unspecified: Secondary | ICD-10-CM | POA: Diagnosis not present

## 2024-02-26 NOTE — Patient Instructions (Signed)
 You will receive a letter in the mail to remind you to call and schedule a colonoscopy in May, 2027.  _______________________________________________________  If your blood pressure at your visit was 140/90 or greater, please contact your primary care physician to follow up on this.  _______________________________________________________  If you are age 74 or older, your body mass index should be between 23-30. Your Body mass index is 23.09 kg/m. If this is out of the aforementioned range listed, please consider follow up with your Primary Care Provider.  If you are age 2 or younger, your body mass index should be between 19-25. Your Body mass index is 23.09 kg/m. If this is out of the aformentioned range listed, please consider follow up with your Primary Care Provider.   ________________________________________________________  The [facility] providers would like to encourage you to use MYCHART to communicate with providers for non-urgent requests or questions.  Due to long hold times on the telephone, sending your provider a message by [facility] may be a faster and more efficient way to get a response.  Please allow 48 business hours for a response.  Please remember that this is for non-urgent requests.  _______________________________________________________  [facility] is using a team-based approach to care.  Your team is made up of your doctor and two to three APPS. Our APPS (Nurse Practitioners and Physician Assistants) work with your physician to ensure care continuity for you. They are fully qualified to address your health concerns and develop a treatment plan. They communicate directly with your gastroenterologist to care for you. Seeing the Advanced Practice Practitioners on your physician's team can help you by facilitating care more promptly, often allowing for earlier appointments, access to diagnostic testing, procedures, and other specialty referrals.

## 2024-02-26 NOTE — Progress Notes (Signed)
 HISTORY OF PRESENT ILLNESS:  Phillip Wall is a 73 y.o. 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  He has a history of pulmonary embolism for which she is on chronic anticoagulation therapy in the form of Xarelto .  Outside records have been obtained and personally reviewed.  He was evaluated at [facility] in their office October 26, 2015 regarding screening colonoscopy.  Index exam in 2005 with Dr. Avram was normal.  His most recent colonoscopy Nov 25, 2015 with Dr. Ladora was normal except for internal hemorrhoids and a small descending colon polyp which was removed and found to be nonadenomatous (inflammatory pseudopolyp).  No family history of colon cancer.  Patient presents today wondering if he should have a follow-up colonoscopy.  This was prompted by his wife's recent colonoscopy.  His GI review of systems is entirely negative.  Review of blood work from December 27, 2023 shows unremarkable CBC with normal hemoglobin 13.3.  He does have a history of GERD for which he takes famotidine .  This helps.  No dysphagia.  REVIEW OF SYSTEMS:  All non-GI ROS negative. Past Medical History:  Diagnosis Date   Anxiety    Depression    ED (erectile dysfunction)    GERD (gastroesophageal reflux disease)    History of colonic polyps    Hypertension     Past Surgical History:  Procedure Laterality Date   ROBOTIC ASSITED PARTIAL NEPHRECTOMY Right 10/30/2021   Procedure: XI ROBOTIC ASSITED  LAPAROSCOPIC PARTIAL NEPHRECTOMY;  Surgeon: Renda Glance, MD;  Location: [facility];  Service: Urology;  Laterality: Right;   TONSILECTOMY/ADENOIDECTOMY WITH MYRINGOTOMY     VASECTOMY  1986    Social History Phillip Wall  reports that he has never smoked. He has never used smokeless tobacco. He reports that he does not currently use alcohol . He reports that he does not use drugs.  family history includes Alcohol  abuse in  his sister; Bladder Cancer in his mother; Breast cancer in his daughter and paternal grandmother; Cancer in his sister; Drug abuse in his sister; Heart attack in his father; Heart disease in his father; Hypertension in his sister.  No Known Allergies     PHYSICAL EXAMINATION: Vital signs: BP 134/70   Pulse 76   Ht 6' 1 (1.854 m)   Wt 175 lb (79.4 kg)   BMI 23.09 kg/m   Constitutional: generally well-appearing, no acute distress Psychiatric: alert and oriented x3, cooperative Eyes: extraocular movements intact, anicteric, conjunctiva pink Mouth: oral pharynx moist, no lesions Neck: supple no lymphadenopathy Cardiovascular: heart regular rate and rhythm, no murmur Lungs: clear to auscultation bilaterally Abdomen: soft, nontender, nondistended, no obvious ascites, no peritoneal signs, normal bowel sounds, no organomegaly Rectal: Omitted Extremities: no clubbing, cyanosis, or lower extremity edema bilaterally Skin: no lesions on visible extremities Neuro: No focal deficits.  Cranial nerves intact  ASSESSMENT:  1.  Normal colonoscopy 2005 and essentially normal colonoscopy 2017 as described.  Currently asymptomatic with normal blood work. 2.  GERD.  No alarm features.  Treated with famotidine . 3.  History of pulmonary embolism on chronic anticoagulation therapy   PLAN:  1.  I reviewed with the patient the appropriate guidelines for surveillance colonoscopy.  Based on his personal history, he would be due for routine follow-up repeat screening/surveillance examination in May 2017.  We will place a recall in the computer for that date.  He understands. 2.  Reflux precautions 3.  Continue famotidine  4.  Resume general medical care with PCP.  Interval GI follow-up as needed Total time of 45 minutes was spent preparing to see the patient, reviewing outside records, endoscopy reports, and pathology.  Obtaining comprehensive history, performing medically appropriate physical examination,  counseling and educating the patient regarding the above listed issues, and documenting clinical information in the health record.

## 2024-03-25 ENCOUNTER — Ambulatory Visit: Admitting: Podiatry

## 2024-04-02 ENCOUNTER — Ambulatory Visit: Admitting: Family Medicine

## 2024-04-02 ENCOUNTER — Encounter: Payer: Self-pay | Admitting: Family Medicine

## 2024-04-02 VITALS — BP 122/70 | HR 61 | Temp 97.2°F | Ht 73.0 in | Wt 173.0 lb

## 2024-04-02 DIAGNOSIS — I10 Essential (primary) hypertension: Secondary | ICD-10-CM | POA: Diagnosis not present

## 2024-04-02 DIAGNOSIS — Z0000 Encounter for general adult medical examination without abnormal findings: Secondary | ICD-10-CM | POA: Diagnosis not present

## 2024-04-02 DIAGNOSIS — Z131 Encounter for screening for diabetes mellitus: Secondary | ICD-10-CM

## 2024-04-02 DIAGNOSIS — Z23 Encounter for immunization: Secondary | ICD-10-CM | POA: Diagnosis not present

## 2024-04-02 DIAGNOSIS — Z13220 Encounter for screening for lipoid disorders: Secondary | ICD-10-CM

## 2024-04-02 DIAGNOSIS — Z125 Encounter for screening for malignant neoplasm of prostate: Secondary | ICD-10-CM | POA: Diagnosis not present

## 2024-04-02 DIAGNOSIS — F39 Unspecified mood [affective] disorder: Secondary | ICD-10-CM

## 2024-04-02 LAB — URINALYSIS, ROUTINE W REFLEX MICROSCOPIC
Bilirubin Urine: NEGATIVE
Hgb urine dipstick: NEGATIVE
Ketones, ur: NEGATIVE
Leukocytes,Ua: NEGATIVE
Nitrite: NEGATIVE
RBC / HPF: NONE SEEN (ref 0–?)
Specific Gravity, Urine: 1.01 (ref 1.000–1.030)
Total Protein, Urine: NEGATIVE
Urine Glucose: NEGATIVE
Urobilinogen, UA: 0.2 (ref 0.0–1.0)
pH: 6 (ref 5.0–8.0)

## 2024-04-02 LAB — CBC WITH DIFFERENTIAL/PLATELET
Basophils Absolute: 0 K/uL (ref 0.0–0.1)
Basophils Relative: 0.5 % (ref 0.0–3.0)
Eosinophils Absolute: 0.1 K/uL (ref 0.0–0.7)
Eosinophils Relative: 2.1 % (ref 0.0–5.0)
HCT: 40.4 % (ref 39.0–52.0)
Hemoglobin: 13.4 g/dL (ref 13.0–17.0)
Lymphocytes Relative: 31.4 % (ref 12.0–46.0)
Lymphs Abs: 1.4 K/uL (ref 0.7–4.0)
MCHC: 33.1 g/dL (ref 30.0–36.0)
MCV: 87.5 fl (ref 78.0–100.0)
Monocytes Absolute: 0.4 K/uL (ref 0.1–1.0)
Monocytes Relative: 9.6 % (ref 3.0–12.0)
Neutro Abs: 2.5 K/uL (ref 1.4–7.7)
Neutrophils Relative %: 56.4 % (ref 43.0–77.0)
Platelets: 214 K/uL (ref 150.0–400.0)
RBC: 4.62 Mil/uL (ref 4.22–5.81)
RDW: 14.8 % (ref 11.5–15.5)
WBC: 4.4 K/uL (ref 4.0–10.5)

## 2024-04-02 LAB — COMPREHENSIVE METABOLIC PANEL WITH GFR
ALT: 12 U/L (ref 0–53)
AST: 14 U/L (ref 0–37)
Albumin: 4.5 g/dL (ref 3.5–5.2)
Alkaline Phosphatase: 65 U/L (ref 39–117)
BUN: 17 mg/dL (ref 6–23)
CO2: 27 meq/L (ref 19–32)
Calcium: 9.4 mg/dL (ref 8.4–10.5)
Chloride: 103 meq/L (ref 96–112)
Creatinine, Ser: 1.27 mg/dL (ref 0.40–1.50)
GFR: 55.85 mL/min — ABNORMAL LOW (ref >60.00)
Glucose, Bld: 95 mg/dL (ref 70–99)
Potassium: 4.1 meq/L (ref 3.5–5.1)
Sodium: 138 meq/L (ref 135–145)
Total Bilirubin: 0.9 mg/dL (ref 0.2–1.2)
Total Protein: 7.4 g/dL (ref 6.0–8.3)

## 2024-04-02 LAB — LIPID PANEL
Cholesterol: 163 mg/dL (ref 0–200)
HDL: 60 mg/dL (ref >39.00)
LDL Cholesterol: 94 mg/dL (ref 0–99)
NonHDL: 103.25
Total CHOL/HDL Ratio: 3
Triglycerides: 45 mg/dL (ref 0.0–149.0)
VLDL: 9 mg/dL (ref 0.0–40.0)

## 2024-04-02 LAB — HEMOGLOBIN A1C: Hgb A1c MFr Bld: 5.6 % (ref 4.6–6.5)

## 2024-04-02 LAB — PSA: PSA: 0.69 ng/mL (ref 0.10–4.00)

## 2024-04-02 NOTE — Progress Notes (Signed)
 Established Patient Office Visit   Subjective:  Patient ID: Phillip Wall, male    DOB: 1950/12/23  Age: 73 y.o. MRN: [id number]  Chief Complaint  Patient presents with   Annual Exam    CPE. Pt is fasting.     HPI Encounter Diagnoses  Name Primary?   Healthcare maintenance Yes   Essential hypertension    Screening for hyperlipidemia    Screening for prostate cancer    Immunization due    Screening for diabetes mellitus    Mood disorder    Here for physical today.  Doing well.  Mains active by walking and going to the [facility].  He does have regular dental care.  He lives at home with his wife.  He is most proud of his great great grandchildren.  Back on Xarelto  for history of spontaneous PE.  Follow-up colonoscopy is due in 2027.  Blood pressure well-controlled with amlodipine  and Micardis .  Continue Celexa  for mood disorder.   Review of Systems  Constitutional: Negative.   HENT: Negative.    Eyes:  Negative for blurred vision, discharge and redness.  Respiratory: Negative.    Cardiovascular: Negative.   Gastrointestinal:  Negative for abdominal pain.  Genitourinary: Negative.   Musculoskeletal: Negative.  Negative for myalgias.  Skin:  Negative for rash.  Neurological:  Negative for tingling, loss of consciousness and weakness.  Endo/Heme/Allergies:  Negative for polydipsia.      04/02/2024    9:09 AM 02/12/2023   10:03 AM 11/19/2022    8:47 AM  Depression screen PHQ 2/9  Decreased Interest 0 0 0  Down, Depressed, Hopeless 0 0 0  PHQ - 2 Score 0 0 0  Altered sleeping 0 0   Tired, decreased energy 0 0   Change in appetite 0 0   Feeling bad or failure about yourself  0 0   Trouble concentrating 0 0   Moving slowly or fidgety/restless 0 0   Suicidal thoughts 0 0   PHQ-9 Score 0 0   Difficult doing work/chores Not difficult at all Not difficult at all        Current Outpatient Medications:    amLODipine  (NORVASC ) 10 MG tablet, TAKE 1 TABLET(10 MG) BY MOUTH  DAILY, Disp: 90 tablet, Rfl: 0   ARTIFICIAL TEAR OP, Place 1 drop into both eyes daily as needed (dry eyes)., Disp: , Rfl:    citalopram  (CELEXA ) 10 MG tablet, Take 1 tablet (10 mg total) by mouth at bedtime., Disp: 30 tablet, Rfl: 3   famotidine  (PEPCID ) 40 MG tablet, Take 1 tablet (40 mg total) by mouth daily as needed for indigestion., Disp: 90 tablet, Rfl: 1   fluticasone  (FLONASE ) 50 MCG/ACT nasal spray, SHAKE LIQUID AND USE 2 SPRAYS IN EACH NOSTRIL DAILY, Disp: 48 g, Rfl: 0   rivaroxaban  (XARELTO ) 10 MG TABS tablet, Take 1 tablet (10 mg total) by mouth daily., Disp: 30 tablet, Rfl: 6   telmisartan  (MICARDIS ) 40 MG tablet, Take 1 tablet (40 mg total) by mouth daily., Disp: 90 tablet, Rfl: 1   Objective:     BP 122/70 (BP Location: Right Arm, Patient Position: Sitting, Cuff Size: Normal)   Pulse 61   Temp (!) 97.2 F (36.2 C) (Temporal)   Ht 6' 1 (1.854 m)   Wt 173 lb (78.5 kg)   SpO2 100%   BMI 22.82 kg/m    Physical Exam Constitutional:      General: He is not in acute distress.    Appearance:  Normal appearance. He is not ill-appearing, toxic-appearing or diaphoretic.  HENT:     Head: Normocephalic and atraumatic.     Right Ear: Tympanic membrane, ear canal and external ear normal.     Left Ear: Tympanic membrane, ear canal and external ear normal.     Mouth/Throat:     Mouth: Mucous membranes are moist.     Pharynx: Oropharynx is clear. No oropharyngeal exudate or posterior oropharyngeal erythema.  Eyes:     General: No scleral icterus.       Right eye: No discharge.        Left eye: No discharge.     Extraocular Movements: Extraocular movements intact.     Conjunctiva/sclera: Conjunctivae normal.     Pupils: Pupils are equal, round, and reactive to light.  Cardiovascular:     Rate and Rhythm: Normal rate and regular rhythm.  Pulmonary:     Effort: Pulmonary effort is normal. No respiratory distress.     Breath sounds: Normal breath sounds. No wheezing or rales.   Abdominal:     General: Bowel sounds are normal.     Tenderness: There is no abdominal tenderness. There is no guarding or rebound.  Musculoskeletal:     Cervical back: No rigidity or tenderness.  Skin:    General: Skin is warm and dry.  Neurological:     Mental Status: He is alert and oriented to person, place, and time.  Psychiatric:        Mood and Affect: Mood normal.        Behavior: Behavior normal.      No results found for any visits on 04/02/24.    The 10-year ASCVD risk score (Arnett DK, et al., 2019) is: 17.8%    Assessment & Plan:   Healthcare maintenance  Essential hypertension -     CBC with Differential/Platelet -     Comprehensive metabolic panel with GFR -     Urinalysis, Routine w reflex microscopic  Screening for hyperlipidemia -     Comprehensive metabolic panel with GFR -     Lipid panel  Screening for prostate cancer -     PSA  Immunization due -     Flu vaccine HIGH DOSE PF(Fluzone Trivalent)  Screening for diabetes mellitus -     Hemoglobin A1c  Mood disorder    Return in about 1 year (around 04/02/2025), or if symptoms worsen or fail to improve.  Continue physical activity.  Continue all medications as directed.  Elsie Sim Lent, MD

## 2024-04-03 ENCOUNTER — Ambulatory Visit: Payer: Self-pay | Admitting: Family Medicine

## 2024-04-27 ENCOUNTER — Other Ambulatory Visit: Payer: Self-pay | Admitting: Family Medicine

## 2024-04-27 DIAGNOSIS — I2694 Multiple subsegmental pulmonary emboli without acute cor pulmonale: Secondary | ICD-10-CM

## 2024-06-24 ENCOUNTER — Other Ambulatory Visit: Payer: Self-pay | Admitting: *Deleted

## 2024-06-24 DIAGNOSIS — I2694 Multiple subsegmental pulmonary emboli without acute cor pulmonale: Secondary | ICD-10-CM

## 2024-06-24 DIAGNOSIS — D6859 Other primary thrombophilia: Secondary | ICD-10-CM

## 2024-06-24 DIAGNOSIS — C641 Malignant neoplasm of right kidney, except renal pelvis: Secondary | ICD-10-CM

## 2024-06-24 DIAGNOSIS — R760 Raised antibody titer: Secondary | ICD-10-CM

## 2024-06-29 ENCOUNTER — Inpatient Hospital Stay (HOSPITAL_BASED_OUTPATIENT_CLINIC_OR_DEPARTMENT_OTHER): Payer: Medicare Other | Admitting: Hematology & Oncology

## 2024-06-29 ENCOUNTER — Inpatient Hospital Stay: Payer: Medicare Other | Attending: Hematology & Oncology

## 2024-06-29 ENCOUNTER — Encounter: Payer: Self-pay | Admitting: Hematology & Oncology

## 2024-06-29 VITALS — BP 160/66 | HR 85 | Temp 98.2°F | Resp 20 | Ht 73.0 in | Wt 177.1 lb

## 2024-06-29 DIAGNOSIS — Z7901 Long term (current) use of anticoagulants: Secondary | ICD-10-CM | POA: Insufficient documentation

## 2024-06-29 DIAGNOSIS — C641 Malignant neoplasm of right kidney, except renal pelvis: Secondary | ICD-10-CM

## 2024-06-29 DIAGNOSIS — I2694 Multiple subsegmental pulmonary emboli without acute cor pulmonale: Secondary | ICD-10-CM

## 2024-06-29 DIAGNOSIS — Z86711 Personal history of pulmonary embolism: Secondary | ICD-10-CM | POA: Insufficient documentation

## 2024-06-29 DIAGNOSIS — D6859 Other primary thrombophilia: Secondary | ICD-10-CM

## 2024-06-29 DIAGNOSIS — D6862 Lupus anticoagulant syndrome: Secondary | ICD-10-CM | POA: Diagnosis not present

## 2024-06-29 DIAGNOSIS — R760 Raised antibody titer: Secondary | ICD-10-CM

## 2024-06-29 LAB — CBC WITH DIFFERENTIAL (CANCER CENTER ONLY)
Abs Immature Granulocytes: 0.03 K/uL (ref 0.00–0.07)
Basophils Absolute: 0 K/uL (ref 0.0–0.1)
Basophils Relative: 0 %
Eosinophils Absolute: 0.1 K/uL (ref 0.0–0.5)
Eosinophils Relative: 1 %
HCT: 36.6 % — ABNORMAL LOW (ref 39.0–52.0)
Hemoglobin: 12.9 g/dL — ABNORMAL LOW (ref 13.0–17.0)
Immature Granulocytes: 0 %
Lymphocytes Relative: 24 %
Lymphs Abs: 1.6 K/uL (ref 0.7–4.0)
MCH: 29.4 pg (ref 26.0–34.0)
MCHC: 35.2 g/dL (ref 30.0–36.0)
MCV: 83.4 fL (ref 80.0–100.0)
Monocytes Absolute: 0.5 K/uL (ref 0.1–1.0)
Monocytes Relative: 7 %
Neutro Abs: 4.5 K/uL (ref 1.7–7.7)
Neutrophils Relative %: 68 %
Platelet Count: 303 K/uL (ref 150–400)
RBC: 4.39 MIL/uL (ref 4.22–5.81)
RDW: 13.6 % (ref 11.5–15.5)
WBC Count: 6.7 K/uL (ref 4.0–10.5)
nRBC: 0 % (ref 0.0–0.2)

## 2024-06-29 LAB — LACTATE DEHYDROGENASE: LDH: 149 U/L (ref 105–235)

## 2024-06-29 LAB — CMP (CANCER CENTER ONLY)
ALT: 11 U/L (ref 0–44)
AST: 16 U/L (ref 15–41)
Albumin: 4.3 g/dL (ref 3.5–5.0)
Alkaline Phosphatase: 89 U/L (ref 38–126)
Anion gap: 12 (ref 5–15)
BUN: 13 mg/dL (ref 8–23)
CO2: 25 mmol/L (ref 22–32)
Calcium: 9.3 mg/dL (ref 8.9–10.3)
Chloride: 103 mmol/L (ref 98–111)
Creatinine: 1.2 mg/dL (ref 0.61–1.24)
GFR, Estimated: >60 mL/min
Glucose, Bld: 99 mg/dL (ref 70–99)
Potassium: 4 mmol/L (ref 3.5–5.1)
Sodium: 139 mmol/L (ref 135–145)
Total Bilirubin: 0.6 mg/dL (ref 0.0–1.2)
Total Protein: 7.6 g/dL (ref 6.5–8.1)

## 2024-06-29 NOTE — Progress Notes (Signed)
 BP remains elevated, 153/50, instructed to monitor at home and notify PCP if it remains over 140/90, verbalized understanding.

## 2024-06-29 NOTE — Progress Notes (Signed)
 " Hematology and Oncology Follow Up Visit  BRADEY LUZIER [id number] 1951-05-08 73 y.o. 06/29/2024   Principle Diagnosis:  Bilateral subsegmental pulmonary emboli Positive lupus anticoagulant   Current Therapy:        Xarelto  10 mg PO daily    Interim History:  Mr. Sensabaugh is here today for follow-up.  We saw him a year ago.  He is doing well.  He has had no problems since we last saw him.  There is been no problems with cough or chest wall pain.  He has had no shortness of breath..  Is had no change in bowel or bladder habits.  There has been no leg swelling.  He has had no leg pain.  He has had no headache.  His appetite has been fairly good.  He has had no nausea or vomiting.  There has been no rashes.  Overall, I would have to say that his performance status is probably ECOG 0.    Medications:  Allergies as of 06/29/2024   No Known Allergies      Medication List        Accurate as of June 29, 2024  1:45 PM. If you have any questions, ask your nurse or doctor.          amLODipine  10 MG tablet Commonly known as: NORVASC  TAKE 1 TABLET(10 MG) BY MOUTH DAILY   ARTIFICIAL TEAR OP Place 1 drop into both eyes daily as needed (dry eyes).   citalopram  10 MG tablet Commonly known as: CELEXA  Take 1 tablet (10 mg total) by mouth at bedtime.   famotidine  40 MG tablet Commonly known as: PEPCID  Take 1 tablet (40 mg total) by mouth daily as needed for indigestion.   fluticasone  50 MCG/ACT nasal spray Commonly known as: FLONASE  SHAKE LIQUID AND USE 2 SPRAYS IN EACH NOSTRIL DAILY   rivaroxaban  10 MG Tabs tablet Commonly known as: XARELTO  Take 1 tablet (10 mg total) by mouth daily.   telmisartan  40 MG tablet Commonly known as: MICARDIS  Take 1 tablet (40 mg total) by mouth daily.        Allergies: No Known Allergies  Past Medical History, Surgical history, Social history, and Family History were reviewed and updated.  Review of Systems:  Review of  Systems  Constitutional: Negative.   HENT: Negative.    Eyes: Negative.   Respiratory: Negative.    Cardiovascular: Negative.   Gastrointestinal: Negative.   Genitourinary: Negative.   Musculoskeletal: Negative.   Skin: Negative.   Neurological: Negative.   Endo/Heme/Allergies: Negative.   Psychiatric/Behavioral: Negative.       Physical Exam:  height is 6' 1 (1.854 m) and weight is 177 lb 1.9 oz (80.3 kg). His oral temperature is 98.2 F (36.8 C). His blood pressure is 160/66 (abnormal) and his pulse is 85. His respiration is 20 and oxygen  saturation is 100%.   Wt Readings from Last 3 Encounters:  06/29/24 177 lb 1.9 oz (80.3 kg)  04/02/24 173 lb (78.5 kg)  02/26/24 175 lb (79.4 kg)    Physical Exam Vitals reviewed.  HENT:     Head: Normocephalic and atraumatic.  Eyes:     Pupils: Pupils are equal, round, and reactive to light.  Cardiovascular:     Rate and Rhythm: Normal rate and regular rhythm.     Heart sounds: Normal heart sounds.  Pulmonary:     Effort: Pulmonary effort is normal.     Breath sounds: Normal breath sounds.  Abdominal:  General: Bowel sounds are normal.     Palpations: Abdomen is soft.  Musculoskeletal:        General: No tenderness or deformity. Normal range of motion.     Cervical back: Normal range of motion.  Lymphadenopathy:     Cervical: No cervical adenopathy.  Skin:    General: Skin is warm and dry.     Findings: No erythema or rash.  Neurological:     Mental Status: He is alert and oriented to person, place, and time.  Psychiatric:        Behavior: Behavior normal.        Thought Content: Thought content normal.        Judgment: Judgment normal.      Lab Results  Component Value Date   WBC 6.7 06/29/2024   HGB 12.9 (L) 06/29/2024   HCT 36.6 (L) 06/29/2024   MCV 83.4 06/29/2024   PLT 303 06/29/2024   No results found for: FERRITIN, IRON, TIBC, UIBC, IRONPCTSAT Lab Results  Component Value Date   RBC 4.39  06/29/2024   No results found for: KPAFRELGTCHN, LAMBDASER, KAPLAMBRATIO No results found for: KIMBERLY LE, IGMSERUM Lab Results  Component Value Date   ALBUMINELP 4.8 05/10/2021   A1GS 0.3 05/10/2021   A2GS 0.8 05/10/2021   BETS 0.4 05/10/2021   BETA2SER 0.3 05/10/2021   GAMS 1.4 05/10/2021   SPEI  05/10/2021     Comment:     Normal Serum Protein Electrophoresis Pattern. No abnormal protein bands (M-protein) detected.      Chemistry      Component Value Date/Time   NA 138 74/08/2023 0951   NA 140 10/27/2016 0000   K 4.1 74/08/2023 0951   CL 103 74/08/2023 0951   CO2 27 74/08/2023 0951   BUN 17 74/08/2023 0951   BUN 14 10/27/2016 0000   CREATININE 1.27 74/08/2023 0951   CREATININE 1.46 (H) 12/27/2023 1305   GLU 81 10/27/2016 0000      Component Value Date/Time   CALCIUM 9.4 74/08/2023 0951   ALKPHOS 65 74/08/2023 0951   AST 14 74/08/2023 0951   AST 12 (L) 12/27/2023 1305   ALT 12 74/08/2023 0951   ALT 10 12/27/2023 1305   BILITOT 0.9 74/08/2023 0951   BILITOT 0.9 12/27/2023 1305       Impression and Plan:  Mr. Golla is a very pleasant 73 yo African American gentleman.  He is a true American hero.  He served in [employer].  He was in [place].  I thanked him for his service.  He has a diagnosis of pulmonary emboli.  This is back in November 2022.  He has been on Xarelto .  He is on maintenance Xarelto  right now.  The last CT angiogram that had back in 2023 did not show any residual pulmonary emboli.  At some point, we can probably get him off Xarelto .  We will plan to get him back in 1 year.  It was somewhat fun talking with him.  He is incredibly eloquent.  He certainly was very proud of what he did in the [employer].    Maude JONELLE Crease, MD 12/29/20251:45 PM  "

## 2024-07-09 ENCOUNTER — Ambulatory Visit: Admitting: Family Medicine

## 2025-06-28 ENCOUNTER — Inpatient Hospital Stay: Admitting: Hematology & Oncology

## 2025-06-28 ENCOUNTER — Inpatient Hospital Stay: Payer: Self-pay
